# Patient Record
Sex: Male | Born: 1955
Health system: Southern US, Community
[De-identification: ages and names within clinical notes are randomized; demographics above are authoritative.]

## PROBLEM LIST (undated history)

## (undated) DIAGNOSIS — Z860100 Personal history of colon polyps, unspecified: Secondary | ICD-10-CM

## (undated) DIAGNOSIS — B9681 Helicobacter pylori [H. pylori] as the cause of diseases classified elsewhere: Secondary | ICD-10-CM

## (undated) DIAGNOSIS — K2970 Gastritis, unspecified, without bleeding: Secondary | ICD-10-CM

## (undated) DIAGNOSIS — E785 Hyperlipidemia, unspecified: Secondary | ICD-10-CM

## (undated) DIAGNOSIS — H269 Unspecified cataract: Secondary | ICD-10-CM

## (undated) DIAGNOSIS — F419 Anxiety disorder, unspecified: Secondary | ICD-10-CM

## (undated) DIAGNOSIS — R51 Headache: Secondary | ICD-10-CM

## (undated) DIAGNOSIS — G8929 Other chronic pain: Secondary | ICD-10-CM

## (undated) DIAGNOSIS — R519 Headache, unspecified: Secondary | ICD-10-CM

## (undated) DIAGNOSIS — I4891 Unspecified atrial fibrillation: Secondary | ICD-10-CM

## (undated) HISTORY — DX: Headache, unspecified: R51.9

## (undated) HISTORY — DX: Helicobacter pylori (H. pylori) as the cause of diseases classified elsewhere: B96.81

## (undated) HISTORY — DX: Headache: R51

## (undated) HISTORY — DX: Personal history of colon polyps, unspecified: Z86.0100

## (undated) HISTORY — DX: Hyperlipidemia, unspecified: E78.5

## (undated) HISTORY — DX: Unspecified cataract: H26.9

## (undated) HISTORY — DX: Anxiety disorder, unspecified: F41.9

## (undated) HISTORY — DX: Gastritis, unspecified, without bleeding: K29.70

## (undated) HISTORY — DX: Other chronic pain: G89.29

## (undated) HISTORY — PX: COLONOSCOPY: SHX174

## (undated) HISTORY — DX: Personal history of colonic polyps: Z86.010

---

## 2003-02-06 HISTORY — PX: HERNIA REPAIR: SHX51

## 2004-01-27 ENCOUNTER — Encounter: Admission: RE | Admit: 2004-01-27 | Discharge: 2004-01-27 | Payer: Self-pay | Admitting: Orthopaedic Surgery

## 2004-03-02 ENCOUNTER — Ambulatory Visit (HOSPITAL_BASED_OUTPATIENT_CLINIC_OR_DEPARTMENT_OTHER): Admission: RE | Admit: 2004-03-02 | Discharge: 2004-03-02 | Payer: Self-pay | Admitting: Orthopaedic Surgery

## 2005-02-05 HISTORY — PX: ROTATOR CUFF REPAIR: SHX139

## 2005-11-12 ENCOUNTER — Emergency Department (HOSPITAL_COMMUNITY): Admission: EM | Admit: 2005-11-12 | Discharge: 2005-11-12 | Payer: Self-pay | Admitting: Emergency Medicine

## 2005-11-18 ENCOUNTER — Encounter: Admission: RE | Admit: 2005-11-18 | Discharge: 2005-11-18 | Payer: Self-pay | Admitting: Neurosurgery

## 2006-01-03 ENCOUNTER — Encounter: Admission: RE | Admit: 2006-01-03 | Discharge: 2006-01-03 | Payer: Self-pay | Admitting: Neurosurgery

## 2006-01-16 ENCOUNTER — Ambulatory Visit (HOSPITAL_COMMUNITY): Admission: RE | Admit: 2006-01-16 | Discharge: 2006-01-16 | Payer: Self-pay | Admitting: Neurosurgery

## 2007-10-09 DIAGNOSIS — B9681 Helicobacter pylori [H. pylori] as the cause of diseases classified elsewhere: Secondary | ICD-10-CM

## 2007-10-09 HISTORY — DX: Helicobacter pylori (H. pylori) as the cause of diseases classified elsewhere: B96.81

## 2007-12-29 ENCOUNTER — Ambulatory Visit: Payer: Self-pay | Admitting: Unknown Physician Specialty

## 2008-02-02 ENCOUNTER — Ambulatory Visit: Payer: Self-pay | Admitting: Unknown Physician Specialty

## 2008-02-02 HISTORY — PX: UPPER GASTROINTESTINAL ENDOSCOPY: SHX188

## 2010-02-12 IMAGING — NM NUCLEAR MEDICINE HEPATOHBILIARY INCLUDE GB
1 series · 9 of 9 positions shown · non-contrast
Comparison: none

REASON FOR EXAM: abdominal pain    RUQ     epigastric
COMMENTS:

[Series 1000: gallbladder statics · 4.80mm/px · 9 of 9 slices shown]
[im 1/9]
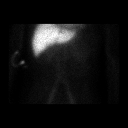
[im 2/9]
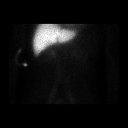
[im 3/9]
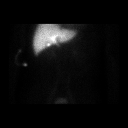
[im 4/9]
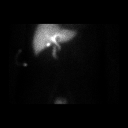
[im 5/9]
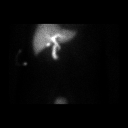
[im 6/9]
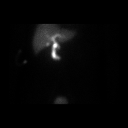
[im 7/9]
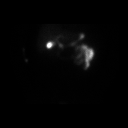
[im 8/9]
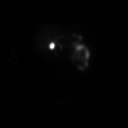
[im 9/9]
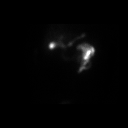

[9 of 9 positions shown; findings below may reference images not displayed]

PROCEDURE:     NM  - NM HEPATOBILIARY IMAGE  - December 29, 2007 [DATE]

RESULT:     Following intravenous administration of 7.5 mCi Tc 99m Choletec,
there is noted prompt visualization of tracer activity in the liver at 3
minutes. At 15 minutes tracer activity is seen in the gallbladder, common
duct and duodenum.  At 50 minutes tracer activity is present in the jejunum
and at 80 minutes there is dense tracer activity in the gallbladder with
little remaining tracer activity noted in the liver. Note is made that in
view of the nonspecific mobile echodensities in the gallbladder noted at
ultrasound, the gallbladder ejection determination was not performed.
IMPRESSION: 1. Normal hepatobiliary scan.
2. The gallbladder ejection fraction was not obtained in view of the mobile
echodensities in the gallbladder noted at ultrasound.

## 2010-10-28 ENCOUNTER — Encounter: Payer: Self-pay | Admitting: Neurosurgery

## 2011-04-10 ENCOUNTER — Encounter: Payer: Self-pay | Admitting: Internal Medicine

## 2011-05-28 ENCOUNTER — Encounter: Payer: Self-pay | Admitting: Internal Medicine

## 2011-05-28 ENCOUNTER — Other Ambulatory Visit (INDEPENDENT_AMBULATORY_CARE_PROVIDER_SITE_OTHER): Payer: Federal, State, Local not specified - PPO

## 2011-05-28 ENCOUNTER — Ambulatory Visit (INDEPENDENT_AMBULATORY_CARE_PROVIDER_SITE_OTHER): Payer: Federal, State, Local not specified - PPO | Admitting: Internal Medicine

## 2011-05-28 VITALS — BP 130/80 | HR 60 | Ht 67.0 in | Wt 179.2 lb

## 2011-05-28 DIAGNOSIS — K921 Melena: Secondary | ICD-10-CM

## 2011-05-28 DIAGNOSIS — R198 Other specified symptoms and signs involving the digestive system and abdomen: Secondary | ICD-10-CM | POA: Insufficient documentation

## 2011-05-28 DIAGNOSIS — R1011 Right upper quadrant pain: Secondary | ICD-10-CM

## 2011-05-28 DIAGNOSIS — K648 Other hemorrhoids: Secondary | ICD-10-CM | POA: Insufficient documentation

## 2011-05-28 LAB — COMPREHENSIVE METABOLIC PANEL
ALT: 31 U/L (ref 0–53)
AST: 19 U/L (ref 0–37)
Alkaline Phosphatase: 56 U/L (ref 39–117)
BUN: 20 mg/dL (ref 6–23)
Chloride: 106 mEq/L (ref 96–112)
Creatinine, Ser: 1.1 mg/dL (ref 0.4–1.5)
Total Bilirubin: 0.8 mg/dL (ref 0.3–1.2)

## 2011-05-28 LAB — CBC WITH DIFFERENTIAL/PLATELET
Basophils Absolute: 0 10*3/uL (ref 0.0–0.1)
Basophils Relative: 0.4 % (ref 0.0–3.0)
Eosinophils Absolute: 0.3 10*3/uL (ref 0.0–0.7)
Hemoglobin: 13.8 g/dL (ref 13.0–17.0)
Lymphocytes Relative: 31.4 % (ref 12.0–46.0)
Lymphs Abs: 1.9 10*3/uL (ref 0.7–4.0)
MCHC: 32.9 g/dL (ref 30.0–36.0)
MCV: 88.7 fl (ref 78.0–100.0)
Monocytes Absolute: 0.5 10*3/uL (ref 0.1–1.0)
Neutro Abs: 3.4 10*3/uL (ref 1.4–7.7)
RDW: 13.3 % (ref 11.5–14.6)

## 2011-05-28 MED ORDER — PEG-KCL-NACL-NASULF-NA ASC-C 100 G PO SOLR: 1.0000 | Freq: Once | ORAL | Status: DC

## 2011-05-28 MED ORDER — METOCLOPRAMIDE HCL 10 MG PO TABS: ORAL_TABLET | ORAL | Status: DC

## 2011-05-28 NOTE — Progress Notes (Signed)
  Subjective:    Patient ID: Alex Huffman, male    DOB: 07-Mar-1956, 55 y.o.   MRN: [id number]  HPI Problems began about 1 month ago. His wife and daughter are my patients, also. Two-two and 1/2 weeks of red blood seen in bowel, floating and in middle of water. Stools had reduced caliber to about size of 5th finger. He called here for appointment about 1 month ago. He then noticed things resolved with normal size of the stools and bleeding stopped.  Prior colonoscopy April 2009 Mec[facility] Collin) showed 2- 3mm cecal polyps (path not available) and internal hemorrhoids. He has seen blood from the hemorrhoids in past but this recent problem had clots and blobs, very different.   Previously recommended to have a cholecystectomy due to intermittent RUQ pain and gallstones. Still has RUQ pain related to fatty foods at times.  He had H. Pylori found and treated twice after he was working in [place]. 6-8# weight loss over the summer, is exercising more.   Review of Systems + fatigue, leg cramps, headaches, anal itching, urine leakage post-voiding - has to wait to make sure he empties completely.  All other ROS negative.    Objective:   Physical Exam General: Well-developed, well-nourished and in no acute distress Vitals: Reviewed and listed above Eyes:anicteric. Mouth and posterior pharynx: normal.  Neck: supple w/o thyromegaly or mass.  Lungs: clear. Heart: S1S2, no rubs, murmurs, gallops. Abdomen: soft, non-tender, no hepatosplenomegaly, hernia, or mass and BS+.  Rectal: deferred Lymphatics: no cervical or [place] nodes. Extremities:  no edema Skin no rash. Neuro: nonfocal. A&O x 3.  Psych: appropriate mood and  affect.         Assessment & Plan:

## 2011-05-28 NOTE — Assessment & Plan Note (Signed)
This sounds like small volume bleeding. I suspect it was probably anorectal but he says it's different than what he had in the past. Previous colonoscopy showed hemorrhoids and 2009, in the interval and the associated change in bowel habits plan on a repeat colonoscopy. Risks benefits and indications were explained he understands and agrees to proceed.

## 2011-05-28 NOTE — Assessment & Plan Note (Signed)
Intermittent right upper quadrant pain with a reported history of gallstones. This is probably secondary to symptomatic cholelithiasis. If so it does seem reasonable to have an elective cholecystectomy. I will review his records and discuss with him again.

## 2011-05-28 NOTE — Assessment & Plan Note (Signed)
This was transient as was the bleeding. Again suggesting hemorrhage but given his in each and the interval since last colonoscopy we'll plan for a repeat colonoscopy to investigate.

## 2011-05-28 NOTE — Patient Instructions (Addendum)
Please go to the basement upon leaving today to have your labs done. You have been scheduled for a Colonoscopy with separate instructions given. Patient requested Miralax prep. Your prep kit has been sent to your pharmacy for you to pick up.

## 2011-05-29 NOTE — Progress Notes (Signed)
Quick Note:  Labs are fine Awaiting [place] report and will discuss that at colonoscopy ______

## 2011-06-08 ENCOUNTER — Encounter: Payer: Self-pay | Admitting: Internal Medicine

## 2011-06-08 ENCOUNTER — Telehealth: Payer: Self-pay | Admitting: Gastroenterology

## 2011-06-08 ENCOUNTER — Ambulatory Visit (AMBULATORY_SURGERY_CENTER): Payer: Federal, State, Local not specified - PPO | Admitting: Internal Medicine

## 2011-06-08 VITALS — BP 140/88 | HR 56 | Temp 98.0°F | Resp 16 | Ht 69.0 in | Wt 179.0 lb

## 2011-06-08 DIAGNOSIS — D175 Benign lipomatous neoplasm of intra-abdominal organs: Secondary | ICD-10-CM

## 2011-06-08 DIAGNOSIS — K921 Melena: Secondary | ICD-10-CM

## 2011-06-08 DIAGNOSIS — K648 Other hemorrhoids: Secondary | ICD-10-CM

## 2011-06-08 DIAGNOSIS — D126 Benign neoplasm of colon, unspecified: Secondary | ICD-10-CM

## 2011-06-08 HISTORY — PX: COLONOSCOPY: SHX174

## 2011-06-08 MED ORDER — SODIUM CHLORIDE 0.9 % IV SOLN: 500.0000 mL | INTRAVENOUS | Status: DC

## 2011-06-08 NOTE — Telephone Encounter (Signed)
Message copied by Bernita Buffy on Fri Jun 08, 2011  9:34 AM ------      Message from: Iva Boop      Created: Fri Jun 08, 2011  9:06 AM      Regarding: are records here?       Have we seen the [place] and path reports from [facility]?            If in my folder sorry -            Just double check please

## 2011-06-08 NOTE — Telephone Encounter (Signed)
Called [facility] and faxed a records release.

## 2011-06-08 NOTE — Patient Instructions (Addendum)
I think the bleeding was from hemorrhoids. You had another small (4mm) polyp that I removed. It should be benign. Small lipoma (fatty tumor) also seen - not an issue.  Once I see your old records, especially the ultrasound, will contact you about a surgical referral. If you do not hear in 1-2 weeks, call us back.  Iva Boop, MD, [facility] Discharge instructions reviewed with patient and care partner.  Handouts given for polyp and hemorrhoids.  You may resume medications as you were taking them prior to your procedure.

## 2011-06-08 NOTE — Telephone Encounter (Signed)
Records received

## 2011-06-12 ENCOUNTER — Telehealth: Payer: Self-pay | Admitting: *Deleted

## 2011-06-12 NOTE — Telephone Encounter (Signed)
No i.d. On machine, no message left 

## 2011-06-15 ENCOUNTER — Encounter: Payer: Self-pay | Admitting: Internal Medicine

## 2011-06-15 DIAGNOSIS — Z860100 Personal history of colon polyps, unspecified: Secondary | ICD-10-CM | POA: Insufficient documentation

## 2011-06-15 NOTE — Progress Notes (Signed)
Quick Note:  Ok - ______ 

## 2012-10-15 DIAGNOSIS — R339 Retention of urine, unspecified: Secondary | ICD-10-CM | POA: Insufficient documentation

## 2012-10-15 DIAGNOSIS — N529 Male erectile dysfunction, unspecified: Secondary | ICD-10-CM | POA: Insufficient documentation

## 2012-10-15 DIAGNOSIS — N411 Chronic prostatitis: Secondary | ICD-10-CM | POA: Insufficient documentation

## 2012-10-15 DIAGNOSIS — R972 Elevated prostate specific antigen [PSA]: Secondary | ICD-10-CM | POA: Insufficient documentation

## 2012-10-15 DIAGNOSIS — N401 Enlarged prostate with lower urinary tract symptoms: Secondary | ICD-10-CM | POA: Insufficient documentation

## 2013-11-20 ENCOUNTER — Ambulatory Visit: Payer: Self-pay | Admitting: Family Medicine

## 2014-10-21 DIAGNOSIS — M5126 Other intervertebral disc displacement, lumbar region: Secondary | ICD-10-CM | POA: Insufficient documentation

## 2015-07-07 DIAGNOSIS — H33312 Horseshoe tear of retina without detachment, left eye: Secondary | ICD-10-CM | POA: Insufficient documentation

## 2016-05-09 DIAGNOSIS — R001 Bradycardia, unspecified: Secondary | ICD-10-CM | POA: Insufficient documentation

## 2016-06-18 ENCOUNTER — Encounter: Payer: Self-pay | Admitting: Internal Medicine

## 2016-07-31 DIAGNOSIS — Z23 Encounter for immunization: Secondary | ICD-10-CM | POA: Diagnosis not present

## 2016-08-22 DIAGNOSIS — J069 Acute upper respiratory infection, unspecified: Secondary | ICD-10-CM | POA: Diagnosis not present

## 2016-08-22 DIAGNOSIS — R05 Cough: Secondary | ICD-10-CM | POA: Diagnosis not present

## 2016-11-21 DIAGNOSIS — K1121 Acute sialoadenitis: Secondary | ICD-10-CM | POA: Diagnosis not present

## 2016-11-21 DIAGNOSIS — J340 Abscess, furuncle and carbuncle of nose: Secondary | ICD-10-CM | POA: Diagnosis not present

## 2016-12-06 DIAGNOSIS — H25813 Combined forms of age-related cataract, bilateral: Secondary | ICD-10-CM | POA: Insufficient documentation

## 2016-12-06 DIAGNOSIS — H4312 Vitreous hemorrhage, left eye: Secondary | ICD-10-CM | POA: Diagnosis not present

## 2016-12-06 DIAGNOSIS — H35411 Lattice degeneration of retina, right eye: Secondary | ICD-10-CM | POA: Diagnosis not present

## 2016-12-06 DIAGNOSIS — H33312 Horseshoe tear of retina without detachment, left eye: Secondary | ICD-10-CM | POA: Diagnosis not present

## 2016-12-18 DIAGNOSIS — H1033 Unspecified acute conjunctivitis, bilateral: Secondary | ICD-10-CM | POA: Diagnosis not present

## 2016-12-20 DIAGNOSIS — H1033 Unspecified acute conjunctivitis, bilateral: Secondary | ICD-10-CM | POA: Diagnosis not present

## 2017-04-08 DIAGNOSIS — K080 Exfoliation of teeth due to systemic causes: Secondary | ICD-10-CM | POA: Diagnosis not present

## 2017-08-08 DIAGNOSIS — H25813 Combined forms of age-related cataract, bilateral: Secondary | ICD-10-CM | POA: Diagnosis not present

## 2017-08-08 DIAGNOSIS — H35411 Lattice degeneration of retina, right eye: Secondary | ICD-10-CM | POA: Diagnosis not present

## 2017-08-08 DIAGNOSIS — H4312 Vitreous hemorrhage, left eye: Secondary | ICD-10-CM | POA: Diagnosis not present

## 2017-08-08 DIAGNOSIS — H33312 Horseshoe tear of retina without detachment, left eye: Secondary | ICD-10-CM | POA: Diagnosis not present

## 2017-09-10 DIAGNOSIS — H33312 Horseshoe tear of retina without detachment, left eye: Secondary | ICD-10-CM | POA: Diagnosis not present

## 2017-09-10 DIAGNOSIS — H25813 Combined forms of age-related cataract, bilateral: Secondary | ICD-10-CM | POA: Diagnosis not present

## 2017-09-10 DIAGNOSIS — H35411 Lattice degeneration of retina, right eye: Secondary | ICD-10-CM | POA: Diagnosis not present

## 2017-09-10 DIAGNOSIS — H4312 Vitreous hemorrhage, left eye: Secondary | ICD-10-CM | POA: Diagnosis not present

## 2017-11-05 ENCOUNTER — Encounter: Payer: Self-pay | Admitting: Internal Medicine

## 2017-12-17 ENCOUNTER — Encounter: Payer: Self-pay | Admitting: *Deleted

## 2017-12-27 DIAGNOSIS — M545 Low back pain: Secondary | ICD-10-CM | POA: Diagnosis not present

## 2018-01-01 DIAGNOSIS — M545 Low back pain: Secondary | ICD-10-CM | POA: Diagnosis not present

## 2018-01-03 ENCOUNTER — Ambulatory Visit (AMBULATORY_SURGERY_CENTER): Payer: Self-pay

## 2018-01-03 ENCOUNTER — Other Ambulatory Visit: Payer: Self-pay

## 2018-01-03 VITALS — Ht 67.0 in | Wt 194.4 lb

## 2018-01-03 DIAGNOSIS — Z86010 Personal history of colonic polyps: Secondary | ICD-10-CM

## 2018-01-03 DIAGNOSIS — M545 Low back pain: Secondary | ICD-10-CM | POA: Diagnosis not present

## 2018-01-03 NOTE — Progress Notes (Signed)
Denies allergies to eggs or soy products. Denies complication of anesthesia or sedation. Denies use of weight loss medication. Denies use of O2.   Emmi instructions declined.  

## 2018-01-06 DIAGNOSIS — M545 Low back pain: Secondary | ICD-10-CM | POA: Diagnosis not present

## 2018-01-09 DIAGNOSIS — M545 Low back pain: Secondary | ICD-10-CM | POA: Diagnosis not present

## 2018-01-15 ENCOUNTER — Telehealth: Payer: Self-pay | Admitting: Internal Medicine

## 2018-01-15 NOTE — Telephone Encounter (Signed)
Left message for pt we had returned his call. Gwyndolyn Saxon

## 2018-01-15 NOTE — Telephone Encounter (Signed)
Pt has questions regarding prep. Would like a call back. Thank you.

## 2018-01-15 NOTE — Telephone Encounter (Signed)
Pt returned phone call and was asking if he could drink the prep 2 days before the procedure. Advised pt we recommend drinking the prep as directed, but he could start it a little earlier on Friday morning, since he is driving from [place].  Pt states he is going to start a liquid diet on his own, a few days ahead and drink the Miralax a little earlier than directed. He will call back if he has further questions. Gwyndolyn Saxon

## 2018-01-17 ENCOUNTER — Encounter: Payer: Self-pay | Admitting: Internal Medicine

## 2018-01-17 ENCOUNTER — Ambulatory Visit (AMBULATORY_SURGERY_CENTER): Payer: Federal, State, Local not specified - PPO | Admitting: Internal Medicine

## 2018-01-17 ENCOUNTER — Other Ambulatory Visit: Payer: Self-pay

## 2018-01-17 VITALS — BP 133/85 | HR 52 | Temp 98.6°F | Resp 12 | Ht 67.0 in | Wt 194.0 lb

## 2018-01-17 DIAGNOSIS — Z1211 Encounter for screening for malignant neoplasm of colon: Secondary | ICD-10-CM | POA: Diagnosis not present

## 2018-01-17 DIAGNOSIS — Z86010 Personal history of colonic polyps: Secondary | ICD-10-CM

## 2018-01-17 MED ORDER — SODIUM CHLORIDE 0.9 % IV SOLN: 500.0000 mL | Freq: Once | INTRAVENOUS | Status: DC

## 2018-01-17 NOTE — Patient Instructions (Addendum)
   No polyps today.  You do have diverticulosis - thickened muscle rings and pouches in the colon wall. Please read the handout about this condition.  Next routine colonoscopy or other screening test in 10 years - 2029  I appreciate the opportunity to care for you. Gatha Mayer, MD, FACG  YOU HAD AN ENDOSCOPIC PROCEDURE TODAY AT [facility]:   Refer to the procedure report that was given to you for any specific questions about what was found during the examination.  If the procedure report does not answer your questions, please call your gastroenterologist to clarify.  If you requested that your care partner not be given the details of your procedure findings, then the procedure report has been included in a sealed envelope for you to review at your convenience later.  YOU SHOULD EXPECT: Some feelings of bloating in the abdomen. Passage of more gas than usual.  Walking can help get rid of the air that was put into your GI tract during the procedure and reduce the bloating. If you had a lower endoscopy (such as a colonoscopy or flexible sigmoidoscopy) you may notice spotting of blood in your stool or on the toilet paper. If you underwent a bowel prep for your procedure, you may not have a normal bowel movement for a few days.  Please Note:  You might notice some irritation and congestion in your nose or some drainage.  This is from the oxygen used during your procedure.  There is no need for concern and it should clear up in a day or so.  SYMPTOMS TO REPORT IMMEDIATELY:   Following lower endoscopy (colonoscopy or flexible sigmoidoscopy):  Excessive amounts of blood in the stool  Significant tenderness or worsening of abdominal pains  Swelling of the abdomen that is new, acute  Fever of 100F or higher  For urgent or emergent issues, a gastroenterologist can be reached at any hour by calling [phone].   DIET:  We do recommend a small meal at first, but then  you may proceed to your regular diet.  Drink plenty of fluids but you should avoid alcoholic beverages for 24 hours.  ACTIVITY:  You should plan to take it easy for the rest of today and you should NOT DRIVE or use heavy machinery until tomorrow (because of the sedation medicines used during the test).    FOLLOW UP: Our staff will call the number listed on your records the next business day following your procedure to check on you and address any questions or concerns that you may have regarding the information given to you following your procedure. If we do not reach you, we will leave a message.  However, if you are feeling well and you are not experiencing any problems, there is no need to return our call.  We will assume that you have returned to your regular daily activities without incident.  If any biopsies were taken you will be contacted by phone or by letter within the next 1-3 weeks.  Please call us at [phone] if you have not heard about the biopsies in 3 weeks.    SIGNATURES/CONFIDENTIALITY: You and/or your care partner have signed paperwork which will be entered into your electronic medical record.  These signatures attest to the fact that that the information above on your After Visit Summary has been reviewed and is understood.  Full responsibility of the confidentiality of this discharge information lies with you and/or your care-partner.

## 2018-01-17 NOTE — Progress Notes (Signed)
Pt's states no medical or surgical changes since previsit or office visit. 

## 2018-01-17 NOTE — Op Note (Signed)
[name] Procedure Date: 01/17/2018 1:33 PM MRN: [id number] Endoscopist: Gatha Mayer , MD Age: 62 Referring MD:  Date of Birth: [birth year] Gender: Male Account #: [id number] Procedure:                Colonoscopy Indications:              High risk colon cancer surveillance: Personal                            history of colonic polyps, Last colonoscopy: 2012 Medicines:                Propofol per Anesthesia, Monitored Anesthesia Care Procedure:                Pre-Anesthesia Assessment:                           - Prior to the procedure, a History and Physical                            was performed, and patient medications and                            allergies were reviewed. The patient's tolerance of                            previous anesthesia was also reviewed. The risks                            and benefits of the procedure and the sedation                            options and risks were discussed with the patient.                            All questions were answered, and informed consent                            was obtained. Prior Anticoagulants: The patient has                            taken no previous anticoagulant or antiplatelet                            agents. ASA Grade Assessment: II - A patient with                            mild systemic disease. After reviewing the risks                            and benefits, the patient was deemed in                            satisfactory condition to undergo the procedure.  After obtaining informed consent, the colonoscope                            was passed under direct vision. Throughout the                            procedure, the patient's blood pressure, pulse, and                            oxygen saturations were monitored continuously. The                            Colonoscope was introduced through the anus and         advanced to the the cecum, identified by                            appendiceal orifice and ileocecal valve. The                            colonoscopy was performed without difficulty. The                            patient tolerated the procedure well. The quality                            of the bowel preparation was good. The bowel                            preparation used was Miralax. The ileocecal valve,                            appendiceal orifice, and rectum were photographed. Scope In: 1:40:53 PM Scope Out: 1:58:06 PM Scope Withdrawal Time: 0 hours 12 minutes 40 seconds  Total Procedure Duration: 0 hours 17 minutes 13 seconds  Findings:                 The perianal and digital rectal examinations were                            normal. Pertinent negatives include normal prostate                            (size, shape, and consistency).                           A few small-mouthed diverticula were found in the                            sigmoid colon.                           The exam was otherwise without abnormality on                            direct and retroflexion  views. Complications:            No immediate complications. Estimated Blood Loss:     Estimated blood loss: none. Impression:               - Diverticulosis in the sigmoid colon.                           - The examination was otherwise normal on direct                            and retroflexion views.                           - No specimens collected. Recommendation:           - Patient has a contact number available for                            emergencies. The signs and symptoms of potential                            delayed complications were discussed with the                            patient. Return to normal activities tomorrow.                            Written discharge instructions were provided to the                            patient.                           - Resume  previous diet.                           - Continue present medications.                           - Repeat colonoscopy in 10 years. Gatha Mayer, MD 01/17/2018 2:05:40 PM This report has been signed electronically.

## 2018-01-17 NOTE — Progress Notes (Signed)
A and O x3. Report to RN. Tolerated MAC anesthesia well.

## 2018-01-17 NOTE — Progress Notes (Signed)
Pt. Slow to wake up.   Alert at time of discharge.

## 2018-01-20 ENCOUNTER — Telehealth: Payer: Self-pay | Admitting: *Deleted

## 2018-01-20 NOTE — Telephone Encounter (Signed)
  Follow up Call-  Call back number 01/17/2018  Post procedure Call Back phone  # [phone]  Permission to leave phone message Yes  Some recent data might be hidden     Patient questions:  Do you have a fever, pain , or abdominal swelling? No. Pain Score  0 *  Have you tolerated food without any problems? Yes   Have you been able to return to your normal activities? Yes   Do you have any questions about your discharge instructions: Diet   No  Medications  No. Follow up visit  No.  Do you have questions or concerns about your Care? No.  Actions: * If pain score is 4 or above: No action needed, pain <4.

## 2018-01-20 NOTE — Telephone Encounter (Signed)
No answer for post procedure call back. Will attempt to call back later this afternoon. SM 

## 2018-03-18 DIAGNOSIS — K080 Exfoliation of teeth due to systemic causes: Secondary | ICD-10-CM | POA: Diagnosis not present

## 2018-04-07 DIAGNOSIS — R5383 Other fatigue: Secondary | ICD-10-CM | POA: Diagnosis not present

## 2018-04-07 DIAGNOSIS — S30861A Insect bite (nonvenomous) of abdominal wall, initial encounter: Secondary | ICD-10-CM | POA: Diagnosis not present

## 2018-04-07 DIAGNOSIS — R21 Rash and other nonspecific skin eruption: Secondary | ICD-10-CM | POA: Diagnosis not present

## 2018-04-07 DIAGNOSIS — R030 Elevated blood-pressure reading, without diagnosis of hypertension: Secondary | ICD-10-CM | POA: Diagnosis not present

## 2018-05-02 ENCOUNTER — Ambulatory Visit: Payer: Federal, State, Local not specified - PPO | Admitting: Family Medicine

## 2018-05-02 ENCOUNTER — Encounter: Payer: Self-pay | Admitting: Family Medicine

## 2018-05-02 VITALS — BP 172/100 | HR 58 | Temp 98.5°F | Ht 66.5 in | Wt 190.8 lb

## 2018-05-02 DIAGNOSIS — Z87898 Personal history of other specified conditions: Secondary | ICD-10-CM | POA: Diagnosis not present

## 2018-05-02 DIAGNOSIS — I10 Essential (primary) hypertension: Secondary | ICD-10-CM

## 2018-05-02 DIAGNOSIS — H9202 Otalgia, left ear: Secondary | ICD-10-CM

## 2018-05-02 DIAGNOSIS — Z7689 Persons encountering health services in other specified circumstances: Secondary | ICD-10-CM

## 2018-05-02 DIAGNOSIS — E669 Obesity, unspecified: Secondary | ICD-10-CM

## 2018-05-02 LAB — CBC WITH DIFFERENTIAL/PLATELET
BASOS PCT: 0.8 % (ref 0.0–3.0)
Basophils Absolute: 0.1 10*3/uL (ref 0.0–0.1)
Eosinophils Absolute: 0.5 10*3/uL (ref 0.0–0.7)
Eosinophils Relative: 7.9 % — ABNORMAL HIGH (ref 0.0–5.0)
HEMATOCRIT: 43.3 % (ref 39.0–52.0)
Hemoglobin: 14.5 g/dL (ref 13.0–17.0)
LYMPHS ABS: 1.6 10*3/uL (ref 0.7–4.0)
Lymphocytes Relative: 25.9 % (ref 12.0–46.0)
MCHC: 33.5 g/dL (ref 30.0–36.0)
MCV: 88 fl (ref 78.0–100.0)
MONOS PCT: 7.3 % (ref 3.0–12.0)
Monocytes Absolute: 0.5 10*3/uL (ref 0.1–1.0)
NEUTROS ABS: 3.6 10*3/uL (ref 1.4–7.7)
Neutrophils Relative %: 58.1 % (ref 43.0–77.0)
PLATELETS: 229 10*3/uL (ref 150.0–400.0)
RBC: 4.92 Mil/uL (ref 4.22–5.81)
RDW: 13.3 % (ref 11.5–15.5)
WBC: 6.2 10*3/uL (ref 4.0–10.5)

## 2018-05-02 LAB — COMPREHENSIVE METABOLIC PANEL
ALT: 57 U/L — ABNORMAL HIGH (ref 0–53)
AST: 23 U/L (ref 0–37)
Albumin: 4.5 g/dL (ref 3.5–5.2)
Alkaline Phosphatase: 58 U/L (ref 39–117)
BILIRUBIN TOTAL: 0.6 mg/dL (ref 0.2–1.2)
BUN: 27 mg/dL — ABNORMAL HIGH (ref 6–23)
CALCIUM: 9.3 mg/dL (ref 8.4–10.5)
CHLORIDE: 103 meq/L (ref 96–112)
CO2: 27 meq/L (ref 19–32)
Creatinine, Ser: 1.2 mg/dL (ref 0.40–1.50)
GFR: 64.74 mL/min (ref >60.00)
Glucose, Bld: 102 mg/dL — ABNORMAL HIGH (ref 70–99)
POTASSIUM: 4.4 meq/L (ref 3.5–5.1)
Sodium: 140 mEq/L (ref 135–145)
Total Protein: 7.1 g/dL (ref 6.0–8.3)

## 2018-05-02 LAB — LIPID PANEL
CHOL/HDL RATIO: 6
CHOLESTEROL: 268 mg/dL — AB (ref 0–200)
HDL: 42.4 mg/dL (ref >39.00)
NONHDL: 225.45
TRIGLYCERIDES: 356 mg/dL — AB (ref 0.0–149.0)
VLDL: 71.2 mg/dL — AB (ref 0.0–40.0)

## 2018-05-02 LAB — LDL CHOLESTEROL, DIRECT: Direct LDL: 72 mg/dL

## 2018-05-02 LAB — PSA: PSA: 5.18 ng/mL — ABNORMAL HIGH (ref 0.10–4.00)

## 2018-05-02 LAB — TSH: TSH: 4.55 u[IU]/mL — AB (ref 0.35–4.50)

## 2018-05-02 MED ORDER — LOSARTAN POTASSIUM 50 MG PO TABS: 50.0000 mg | ORAL_TABLET | Freq: Every day | ORAL | 3 refills | Status: DC

## 2018-05-02 NOTE — Progress Notes (Signed)
Subjective:    Patient ID: Alex Huffman, male    DOB: [birth year], 62 y.o.   MRN: [id number]  HPI This is a 62 yo male who presents today to establish care. He is married, retired. Has three daughters, granddaughter Anda Kraft (46 months old). Feels like he needs some "oversight with his health."   Last CPE- several years ago, has had intermittent acute care PSA- has history of elevation to 10 following a DRE and then went down, advised to have biopsy but declined.  Colonoscopy- 01/17/18 Carle[facility] Purl) Tdap- unsure, will check records Flu- annual Dental- regular Eye- [facility]- at least annually for detached retina Exercise- used to run for many years, stopped after he got detached retina while running  HTN- has had elevated readings off and on, previously on amlodipine which caused leg pain and fatigue so he stopped. Strong family history of HTN. Eats no fast food, little processed food.   Hyperlipidemia- was able to decrease with diet in the past  Since retiring, doesn't feel like he has a purpose, misses being the go to guy, getting called, being relied upon. Spends his days doing things around the house. Gets a lot of joy from seeing granddaughter. Stressed with one of his daughters moving to [place].   Past Medical History:  Diagnosis Date  . Cataract   . Chronic headaches   . Helicobacter pylori gastritis 2009   EGD  . Hyperlipemia   . Personal history of colonic polyps    Past Surgical History:  Procedure Laterality Date  . COLONOSCOPY  , 06/08/2011   2009: two 3 mm hyperplastic polyps, lipoma, internal hemorrjhoids 2012: 48mm cecal serrated adenoma, lipoma, internal hemorrhoids  . HERNIA REPAIR  05/04  . ROTATOR CUFF REPAIR  05/06  . UPPER GASTROINTESTINAL ENDOSCOPY  02/02/2008   erosive gastropathy   Family History  Problem Relation Age of Onset  . Heart disease Father   . Celiac disease Mother   . Colon cancer Neg Hx   . Esophageal cancer Neg Hx   . Liver cancer Neg Hx   .  Pancreatic cancer Neg Hx   . Rectal cancer Neg Hx   . Stomach cancer Neg Hx    Social History   Tobacco Use  . Smoking status: Never Smoker  . Smokeless tobacco: Never Used  Substance Use Topics  . Alcohol use: Yes    Comment: Occasional beer  . Drug use: No      Review of Systems  Constitutional: Positive for fatigue (mild, long standing).  HENT: Positive for ear pain (left, uses qtips, feels "spongy.").   Eyes: Negative.   Respiratory: Negative for cough, chest tightness and shortness of breath.   Cardiovascular: Negative.   Gastrointestinal: Negative.   Genitourinary: Negative for dysuria and frequency.       Intermittent weak stream for several years.   Musculoskeletal: Negative.   Psychiatric/Behavioral: Negative.        Objective:   Physical Exam  Constitutional: He is oriented to person, place, and time. He appears well-developed and well-nourished. No distress.  HENT:  Head: Normocephalic and atraumatic.  Right Ear: Tympanic membrane, external ear and ear canal normal.  Left Ear: External ear normal.  Nose: Nose normal.  Mouth/Throat: Oropharynx is clear and moist.  Right ear with moderate amount cerumen obstruction TM. Irrigated by [employer] Patient with pain, irrigation stopped. Cerumen with some movement, able to visualize portion of TM, no redness, no tenderness to exam. Debrox instilled into canal to help with  cerumen removal.   Eyes: Conjunctivae are normal.  Cardiovascular: Normal rate, regular rhythm and normal heart sounds.  Pulmonary/Chest: Effort normal and breath sounds normal.  Musculoskeletal: He exhibits no edema.  Neurological: He is alert and oriented to person, place, and time.  Skin: Skin is warm and dry. He is not diaphoretic.  Psychiatric: He has a normal mood and affect. His behavior is normal. Judgment and thought content normal.  Vitals reviewed.     BP (!) 172/100 (BP Location: Right Arm, Patient Position: Sitting, Cuff Size: Normal)    Pulse (!) 58   Temp 98.5 F (36.9 C) (Oral)   Ht 5' 6.5" (1.689 m)   Wt 190 lb 12 oz (86.5 kg)   SpO2 97%   BMI 30.33 kg/m  Wt Readings from Last 3 Encounters:  05/02/18 190 lb 12 oz (86.5 kg)  01/17/18 194 lb (88 kg)  01/03/18 194 lb 6.4 oz (88.2 kg)   Recheck BP- 164/104 Depression screen PHQ 2/9 05/02/2018  Decreased Interest 0  Down, Depressed, Hopeless 0  PHQ - 2 Score 0       Assessment & Plan:  1. Encounter to establish care - Discussed and encouraged healthy lifestyle choices- adequate sleep, regular exercise, stress management and healthy food choices.  - follow up for CPE in 4-6 weeks  2. Essential hypertension - discussed need for medication with patient, importance of avoiding excessive salt in diet, weight loss, exercise, role of genes - CBC with Differential - Comprehensive metabolic panel - Lipid Panel - losartan (COZAAR) 50 MG tablet; Take 1 tablet (50 mg total) by mouth daily.  Dispense: 90 tablet; Refill: 3 - TSH  3. History of elevated PSA - PSA  4. Left ear pain - advised OTC wax softening agent, advised against cotton swab use as I think pain from impacted cerumen  5. Obesity (BMI 30.0-34.9) - encouraged increased exercise, continued healthy food choices   Clarene Reamer, FNP-BC  [facility], [facility]  05/03/2018 8:49 AM

## 2018-05-02 NOTE — Patient Instructions (Addendum)
Good to meet you today  Please follow up in 4-6 weeks for a complete physical exam  Get signed up for your Mychart account and I will send you an email notification about your labs  Let me know if you have any problems with your medication

## 2018-05-05 ENCOUNTER — Other Ambulatory Visit: Payer: Self-pay | Admitting: Family Medicine

## 2018-05-05 ENCOUNTER — Encounter: Payer: Self-pay | Admitting: Family Medicine

## 2018-05-05 DIAGNOSIS — E039 Hypothyroidism, unspecified: Secondary | ICD-10-CM

## 2018-05-05 MED ORDER — LEVOTHYROXINE SODIUM 50 MCG PO TABS: 50.0000 ug | ORAL_TABLET | Freq: Every day | ORAL | 1 refills | Status: DC

## 2018-05-09 ENCOUNTER — Encounter: Payer: Self-pay | Admitting: Family Medicine

## 2018-05-10 ENCOUNTER — Encounter: Payer: Self-pay | Admitting: Family Medicine

## 2018-06-02 ENCOUNTER — Telehealth: Payer: Self-pay | Admitting: Internal Medicine

## 2018-06-02 ENCOUNTER — Encounter: Payer: Self-pay | Admitting: Family Medicine

## 2018-06-02 ENCOUNTER — Ambulatory Visit (INDEPENDENT_AMBULATORY_CARE_PROVIDER_SITE_OTHER): Payer: Federal, State, Local not specified - PPO | Admitting: Family Medicine

## 2018-06-02 VITALS — BP 116/72 | HR 56 | Temp 97.6°F | Ht 66.5 in | Wt 176.8 lb

## 2018-06-02 DIAGNOSIS — I10 Essential (primary) hypertension: Secondary | ICD-10-CM

## 2018-06-02 DIAGNOSIS — Z87898 Personal history of other specified conditions: Secondary | ICD-10-CM

## 2018-06-02 DIAGNOSIS — E039 Hypothyroidism, unspecified: Secondary | ICD-10-CM

## 2018-06-02 DIAGNOSIS — Z0000 Encounter for general adult medical examination without abnormal findings: Secondary | ICD-10-CM

## 2018-06-02 DIAGNOSIS — E7800 Pure hypercholesterolemia, unspecified: Secondary | ICD-10-CM | POA: Diagnosis not present

## 2018-06-02 LAB — LIPID PANEL
CHOL/HDL RATIO: 3
CHOLESTEROL: 130 mg/dL (ref 0–200)
HDL: 49.1 mg/dL (ref >39.00)
LDL Cholesterol: 62 mg/dL (ref 0–99)
NonHDL: 80.63
TRIGLYCERIDES: 92 mg/dL (ref 0.0–149.0)
VLDL: 18.4 mg/dL (ref 0.0–40.0)

## 2018-06-02 LAB — TSH: TSH: 0.99 u[IU]/mL (ref 0.35–4.50)

## 2018-06-02 NOTE — Patient Instructions (Signed)
Good to see you today  Please follow up in 6 months  I'll be in touch about your labs  Keeping you healthy  Get these tests  Blood pressure- Have your blood pressure checked once a year by your healthcare provider.  Normal blood pressure is 120/80  Weight- Have your body mass index (BMI) calculated to screen for obesity.  BMI is a measure of body fat based on height and weight. You can also calculate your own BMI at [employer]Bank[employer].si.  Cholesterol- Have your cholesterol checked every year.  Diabetes- Have your blood sugar checked regularly if you have high blood pressure, high cholesterol, have a family history of diabetes or if you are overweight.  Screening for Colon Cancer- Colonoscopy starting at age 61.  Screening may begin sooner depending on your family history and other health conditions. Follow up colonoscopy as directed by your Gastroenterologist.  Screening for Prostate Cancer- Both blood work (PSA) and a rectal exam help screen for Prostate Cancer.  Screening begins at age 21 with African-American men and at age 101 with Caucasian men.  Screening may begin sooner depending on your family history.  Take these medicines  Aspirin- One aspirin daily can help prevent Heart disease and Stroke.  Flu shot- Every fall.  Tetanus- Every 10 years.  Zostavax- Once after the age of 85 to prevent Shingles.  Pneumonia shot- Once after the age of 77; if you are younger than 32, ask your healthcare provider if you need a Pneumonia shot.  Take these steps  Don't smoke- If you do smoke, talk to your doctor about quitting.  For tips on how to quit, go to www.smokefree.gov or call 1-800-QUIT-NOW.  Be physically active- Exercise 5 days a week for at least 30 minutes.  If you are not already physically active start slow and gradually work up to 30 minutes of moderate physical activity.  Examples of moderate activity include walking briskly, mowing the yard, dancing, swimming,  bicycling, etc.  Eat a healthy diet- Eat a variety of healthy food such as fruits, vegetables, low fat milk, low fat cheese, yogurt, lean meant, poultry, fish, beans, tofu, etc. For more information go to www.thenutritionsource.org  Drink alcohol in moderation- Limit alcohol intake to less than two drinks a day. Never drink and drive.  Dentist- Brush and floss twice daily; visit your dentist twice a year.  Depression- Your emotional health is as important as your physical health. If you're feeling down, or losing interest in things you would normally enjoy please talk to your healthcare provider.  Eye exam- Visit your eye doctor every year.  Safe sex- If you may be exposed to a sexually transmitted infection, use a condom.  Seat belts- Seat belts can save your life; always wear one.  Smoke/Carbon Monoxide detectors- These detectors need to be installed on the appropriate level of your home.  Replace batteries at least once a year.  Skin cancer- When out in the sun, cover up and use sunscreen 15 SPF or higher.  Violence- If anyone is threatening you, please tell your healthcare provider.  Living Will/ Health care power of attorney- Speak with your healthcare provider and family.

## 2018-06-02 NOTE — Telephone Encounter (Signed)
Patient asking for a urologists recommendation

## 2018-06-02 NOTE — Progress Notes (Signed)
Subjective:    Patient ID: Alex Huffman, male    DOB: 1956-04-19, 62 y.o.   MRN: [id number]  HPI This is a 62 yo male who presents today for CPE. Was seen last month and started on losartan 50 mg and synthroid 50 mcg. Did not start losartan.    Last CPE- several years ago, has had intermittent acute care PSA- has history of elevation to 10 following a DRE and then went down, advised to have biopsy but declined.  Colonoscopy- 01/17/18 Carlean Purl) Tdap- unsure, will check records Flu- annual Dental- regular Eye- [facility]- at least annually for detached retina Exercise- has been walking daily Diet- has been watching diet  HTN- brings daily BP log, bps started 140-150s/90-100 but have come down in last 3 weeks to 120-130s/70-80s.   Past Medical History:  Diagnosis Date  . Cataract   . Chronic headaches   . Helicobacter pylori gastritis 2009   EGD  . Hyperlipemia   . Personal history of colonic polyps    Past Surgical History:  Procedure Laterality Date  . COLONOSCOPY  , 06/08/2011   2009: two 3 mm hyperplastic polyps, lipoma, internal hemorrjhoids 2012: 52mm cecal serrated adenoma, lipoma, internal hemorrhoids  . HERNIA REPAIR  05/04  . ROTATOR CUFF REPAIR  05/06  . UPPER GASTROINTESTINAL ENDOSCOPY  02/02/2008   erosive gastropathy   Family History  Problem Relation Age of Onset  . Heart disease Father   . Celiac disease Mother   . Colon cancer Neg Hx   . Esophageal cancer Neg Hx   . Liver cancer Neg Hx   . Pancreatic cancer Neg Hx   . Rectal cancer Neg Hx   . Stomach cancer Neg Hx    Social History   Tobacco Use  . Smoking status: Never Smoker  . Smokeless tobacco: Never Used  Substance Use Topics  . Alcohol use: Yes    Comment: Occasional beer  . Drug use: No    Review of Systems  Constitutional: Negative.   Eyes: Positive for visual disturbance (with running, not with walking, has follow up with retina specialist upcoming).  Respiratory: Negative.     Cardiovascular: Negative.   Gastrointestinal: Negative.   Endocrine: Negative.   Genitourinary: Negative.   Musculoskeletal: Negative.   Allergic/Immunologic: Negative.   Neurological: Negative.   Hematological: Negative.   Psychiatric/Behavioral: Negative.        Mood and sleep improved. Not watching as much news.        Objective:   Physical Exam Physical Exam  Constitutional: He is oriented to person, place, and time. He appears well-developed and well-nourished.  HENT:  Head: Normocephalic and atraumatic.  Right Ear: External ear normal.  Left Ear: External ear normal.  Nose: Nose normal.  Mouth/Throat: Oropharynx is clear and moist.  Eyes: Conjunctivae are normal. Pupils are equal, round, and reactive to light.  Neck: Normal range of motion. Neck supple.  Cardiovascular: Normal rate, regular rhythm, normal heart sounds and intact distal pulses.   Pulmonary/Chest: Effort normal and breath sounds normal.  Abdominal: Soft. Bowel sounds are normal.  Musculoskeletal: Normal range of motion. He exhibits no edema or tenderness.       Cervical back: Normal.       Thoracic back: Normal.       Lumbar back: Normal.  Lymphadenopathy:    He has no cervical adenopathy.       Right: No inguinal adenopathy present.       Left: No inguinal adenopathy  present.  Neurological: He is alert and oriented to person, place, and time.   Skin: Skin is warm and dry.  Psychiatric: He has a normal mood and affect. His behavior is normal. Judgment normal.  Vitals reviewed.     BP 116/72 (BP Location: Left Arm, Patient Position: Sitting, Cuff Size: Normal)   Pulse (!) 56   Temp 97.6 F (36.4 C) (Oral)   Ht 5' 6.5" (1.689 m)   Wt 176 lb 12 oz (80.2 kg)   SpO2 97%   BMI 28.10 kg/m  Wt Readings from Last 3 Encounters:  06/02/18 176 lb 12 oz (80.2 kg)  05/02/18 190 lb 12 oz (86.5 kg)  01/17/18 194 lb (88 kg)   BP Readings from Last 3 Encounters:  06/02/18 116/72  05/02/18 (!) 172/100   01/17/18 133/85       Assessment & Plan:  1. Annual physical exam - Discussed and encouraged healthy lifestyle choices- adequate sleep, regular exercise, stress management and healthy food choices.   2. Acquired hypothyroidism - TSH  3. Elevated cholesterol - Lipid Panel  4. Essential hypertension - well controlled in office today and according to home readings - no meds currently, continue weight loss, exercise, healthy food choices  5. History of elevated PSA - Ambulatory referral to Urology  - follow up 6 months  Clarene Reamer, FNP-BC  [facility], [place]  06/02/2018 1:04 PM

## 2018-06-03 ENCOUNTER — Encounter: Payer: Self-pay | Admitting: Family Medicine

## 2018-06-03 NOTE — Telephone Encounter (Signed)
He is seeing Jackelyn Poling and she is referring him for an elevated PSA  ? If this was misdirected  I can give him some names but Jackelyn Poling knows the same ones I think

## 2018-06-03 NOTE — Telephone Encounter (Signed)
Dr Carlean Purl patient asked to see if you had certain urologist you recommend also. I spoke with Clarene Reamer, NP and she did not have any specific preference. We use [facility] or [facility]. I did speak with patient at length in regards to both of those offices. He did ask for Dr Bernardo Heater but wanted to alsp see if you had a preference in someone else also? Thank you-Marc Leichter V Nayali Talerico, RMA (from [facility])

## 2018-06-03 NOTE — Telephone Encounter (Signed)
OK  Well I would tell him I do not have 1 or 2 go to people - I think [facility] is really good across the board. I do not know Dr. Bernardo Heater but bet he is fine.  It could make a difference if the elevated PSA represents a problem and he needs surgery, etc (hope not!) - that is where you would want to be more choosy  Hope this helps  Take Care  CEG

## 2018-06-04 NOTE — Telephone Encounter (Signed)
Noted. Left message for patient to me back-Alex Huffman Estell Harpin, RMA

## 2018-06-06 NOTE — Telephone Encounter (Signed)
Left message for patient to call back-Alex Huffman [name], RMA  

## 2018-06-10 NOTE — Telephone Encounter (Signed)
Sent mychart message to patient with all the information-Alex Huffman, RMA

## 2018-06-11 ENCOUNTER — Ambulatory Visit (INDEPENDENT_AMBULATORY_CARE_PROVIDER_SITE_OTHER): Payer: Federal, State, Local not specified - PPO | Admitting: Family Medicine

## 2018-06-11 ENCOUNTER — Ambulatory Visit: Payer: Self-pay | Admitting: Family Medicine

## 2018-06-11 ENCOUNTER — Encounter: Payer: Self-pay | Admitting: Family Medicine

## 2018-06-11 ENCOUNTER — Ambulatory Visit: Payer: Self-pay | Admitting: *Deleted

## 2018-06-11 VITALS — BP 96/60 | HR 90 | Temp 98.4°F | Ht 66.5 in | Wt 176.8 lb

## 2018-06-11 DIAGNOSIS — R42 Dizziness and giddiness: Secondary | ICD-10-CM | POA: Diagnosis not present

## 2018-06-11 DIAGNOSIS — R002 Palpitations: Secondary | ICD-10-CM

## 2018-06-11 DIAGNOSIS — E039 Hypothyroidism, unspecified: Secondary | ICD-10-CM

## 2018-06-11 DIAGNOSIS — R634 Abnormal weight loss: Secondary | ICD-10-CM | POA: Diagnosis not present

## 2018-06-11 NOTE — Telephone Encounter (Signed)
  He called in saying he started taking Synthroid towards the end of July.  Over the last several days he has begun having symptoms listed below.   He is wondering if it is from the synthroid.  I have routed a high priority note to Clarene Reamer, FNP.   I let him know someone from the office will be in contact with him.  He was agreeable to this plan.   Reason for Disposition . Caller has URGENT medication question about med that PCP prescribed and triager unable to answer question  Answer Assessment - Initial Assessment Questions 1. SYMPTOMS: "Do you have any symptoms?"     I'm on Synthroid.  Just started it later part of July.   I was fine.   Over last several days I've noticed a change.   Dizziness, rapid heart beat, clammy, heat intolerance, headache and less urine output.   I'm out of breath where I wasn't before.   My BP was 97/78.  Pulse 80.   Usually my pulse is 45-50.   I feel a little nervousness.   I'm doing good with the diet and exercise program.   I'm losing weight. Also when I swallow I can feel myself swallow.   But I feel it and notice it when I swallow.    2. SEVERITY: If symptoms are present, ask "Are they mild, moderate or severe?"     *No Answer*  Protocols used: MEDICATION QUESTION CALL-A-AH

## 2018-06-11 NOTE — Telephone Encounter (Signed)
Also see mychart message

## 2018-06-11 NOTE — Patient Instructions (Signed)
Decrease your levothyroxine to 1/2 tablet or 25 mcg daily  Liberalize your carbohydrate intake  I will notify you of lab results and will see how you are doing

## 2018-06-11 NOTE — Progress Notes (Signed)
Subjective:    Patient ID: Alex Huffman, male    DOB: 27-Jun-1956, 62 y.o.   MRN: [id number]  HPI This is a 62 yo male who presents today with some dizziness and decreased blood pressure. Has felt heart racing and hot off and on for several weeks. Had sensation of presyncope and took his blood pressure and was 98/78, HR was in 80s. Ate and he felt a little better. Later took his blood pressure and it was 119/80 and HR was 90.  Currently feeling a little light headed and feels like his heart is pounding. Feeling heat intolerant. Bowel movements now every other day from every day. Thinks that symptoms started around the time he started taking levothyroxine 50 mcg. TSH 05/02/18 was mildly elevated at 4.55 and he was started on low dose levothyroxine. TSH after 1 month treatment on 06/02/18 was 0.99.  He denies any chest pain, states he feels a little winded with going up stairs and carrying heavy objects. Has been increasing exercise recently and is regaining his exercise tolerance. He has recently been restricting his food intake and particular his carbohydrates in an attempt to control HTN and elevated lipids. He is unsure how much water he has been consuming but reports that his urine has been light yellow to clear.    Past Medical History:  Diagnosis Date  . Cataract   . Chronic headaches   . Helicobacter pylori gastritis 2009   EGD  . Hyperlipemia   . Personal history of colonic polyps    Past Surgical History:  Procedure Laterality Date  . COLONOSCOPY  , 06/08/2011   2009: two 3 mm hyperplastic polyps, lipoma, internal hemorrjhoids 2012: 51mm cecal serrated adenoma, lipoma, internal hemorrhoids  . HERNIA REPAIR  05/04  . ROTATOR CUFF REPAIR  05/06  . UPPER GASTROINTESTINAL ENDOSCOPY  02/02/2008   erosive gastropathy   Family History  Problem Relation Age of Onset  . Heart disease Father   . Celiac disease Mother   . Colon cancer Neg Hx   . Esophageal cancer Neg Hx   . Liver cancer  Neg Hx   . Pancreatic cancer Neg Hx   . Rectal cancer Neg Hx   . Stomach cancer Neg Hx    Social History   Tobacco Use  . Smoking status: Never Smoker  . Smokeless tobacco: Never Used  Substance Use Topics  . Alcohol use: Not Currently    Comment: Occasional beer  . Drug use: No      Review of Systems Per HPI    Objective:   Physical Exam Physical Exam  Constitutional: Oriented to person, place, and time. He appears well-developed and well-nourished.  HENT:  Head: Normocephalic and atraumatic.  Eyes: Conjunctivae are normal.  Neck: Normal range of motion. Neck supple.  Cardiovascular: Normal rate, regular rhythm and normal heart sounds.   Pulmonary/Chest: Effort normal and breath sounds normal.  Musculoskeletal: No edema.  Neurological: Alert and oriented to person, place, and time.  Skin: Skin is warm and dry.  Psychiatric: Normal mood and affect. Behavior is normal. Judgment and thought content normal.  Vitals reviewed.     BP 96/60   Pulse 90   Temp 98.4 F (36.9 C) (Oral)   Ht 5' 6.5" (1.689 m)   Wt 176 lb 12 oz (80.2 kg)   SpO2 97%   BMI 28.10 kg/m  Wt Readings from Last 3 Encounters:  06/11/18 176 lb 12 oz (80.2 kg)  06/02/18 176 lb 12  oz (80.2 kg)  05/02/18 190 lb 12 oz (86.5 kg)   EKG- NSR rate 60 with 1 PAC    Assessment & Plan:  1. Palpitations - EKG reassuring, will check labs, he was instructed to decrease his levothyroxine to 1/2 tablet (25 mcg) daily - CBC with Differential - Comprehensive metabolic panel - EKG 17-CBSW  2. Lightheadedness - improves with eating, discussed with him his food restriction and weight loss and encouraged him to increase intake and add in more healthy carbs, hydrate adequately - CBC with Differential - Comprehensive metabolic panel - EKG 96-PRFF  3. Acquired hypothyroidism - he was instructed to decrease his levothyroxine to 25 mcg (1/2 of 50 mcg) - will recheck in 4-6 weeks  4. Weight loss - encouraged  him to liberalize diet   - follow up to be determined by labs and symptoms  Clarene Reamer, FNP-BC  [facility], [place]  06/12/2018 8:41 PM

## 2018-06-11 NOTE — Telephone Encounter (Signed)
Patient called and appointment made for this afternoon.

## 2018-06-11 NOTE — Telephone Encounter (Signed)
Attempted to contact pt gregarding; left message on voicemail [phone] for pt to call office; unable to complete triage at this time.

## 2018-06-12 ENCOUNTER — Encounter: Payer: Self-pay | Admitting: Family Medicine

## 2018-06-12 LAB — CBC WITH DIFFERENTIAL/PLATELET
BASOS ABS: 0.1 10*3/uL (ref 0.0–0.1)
Basophils Relative: 1.2 % (ref 0.0–3.0)
Eosinophils Absolute: 0.8 10*3/uL — ABNORMAL HIGH (ref 0.0–0.7)
Eosinophils Relative: 10.8 % — ABNORMAL HIGH (ref 0.0–5.0)
HCT: 42 % (ref 39.0–52.0)
Hemoglobin: 14.1 g/dL (ref 13.0–17.0)
Lymphocytes Relative: 29.6 % (ref 12.0–46.0)
Lymphs Abs: 2.1 10*3/uL (ref 0.7–4.0)
MCHC: 33.6 g/dL (ref 30.0–36.0)
MCV: 87.8 fl (ref 78.0–100.0)
MONO ABS: 0.5 10*3/uL (ref 0.1–1.0)
Monocytes Relative: 7 % (ref 3.0–12.0)
NEUTROS PCT: 51.4 % (ref 43.0–77.0)
Neutro Abs: 3.6 10*3/uL (ref 1.4–7.7)
Platelets: 216 10*3/uL (ref 150.0–400.0)
RBC: 4.78 Mil/uL (ref 4.22–5.81)
RDW: 12.8 % (ref 11.5–15.5)
WBC: 7.1 10*3/uL (ref 4.0–10.5)

## 2018-06-12 LAB — COMPREHENSIVE METABOLIC PANEL
ALK PHOS: 48 U/L (ref 39–117)
ALT: 25 U/L (ref 0–53)
AST: 20 U/L (ref 0–37)
Albumin: 4.4 g/dL (ref 3.5–5.2)
BILIRUBIN TOTAL: 0.6 mg/dL (ref 0.2–1.2)
BUN: 25 mg/dL — AB (ref 6–23)
CO2: 27 mEq/L (ref 19–32)
Calcium: 9.7 mg/dL (ref 8.4–10.5)
Chloride: 107 mEq/L (ref 96–112)
Creatinine, Ser: 1.26 mg/dL (ref 0.40–1.50)
GFR: 61.17 mL/min (ref >60.00)
GLUCOSE: 96 mg/dL (ref 70–99)
POTASSIUM: 4.5 meq/L (ref 3.5–5.1)
SODIUM: 142 meq/L (ref 135–145)
TOTAL PROTEIN: 6.7 g/dL (ref 6.0–8.3)

## 2018-06-13 ENCOUNTER — Encounter: Payer: Self-pay | Admitting: Family Medicine

## 2018-06-13 ENCOUNTER — Other Ambulatory Visit: Payer: Self-pay | Admitting: Family Medicine

## 2018-06-13 DIAGNOSIS — E039 Hypothyroidism, unspecified: Secondary | ICD-10-CM

## 2018-06-13 MED ORDER — LEVOTHYROXINE SODIUM 25 MCG PO TABS: 25.0000 ug | ORAL_TABLET | Freq: Every day | ORAL | 5 refills | Status: DC

## 2018-06-25 ENCOUNTER — Encounter: Payer: Self-pay | Admitting: Family Medicine

## 2018-07-17 DIAGNOSIS — H25813 Combined forms of age-related cataract, bilateral: Secondary | ICD-10-CM | POA: Diagnosis not present

## 2018-07-17 DIAGNOSIS — H4312 Vitreous hemorrhage, left eye: Secondary | ICD-10-CM | POA: Diagnosis not present

## 2018-07-17 DIAGNOSIS — H35411 Lattice degeneration of retina, right eye: Secondary | ICD-10-CM | POA: Diagnosis not present

## 2018-07-17 DIAGNOSIS — H33312 Horseshoe tear of retina without detachment, left eye: Secondary | ICD-10-CM | POA: Diagnosis not present

## 2018-07-23 ENCOUNTER — Telehealth: Payer: Self-pay

## 2018-07-23 NOTE — Telephone Encounter (Signed)
Copied from [place] [id number]. Topic: Appointment Scheduling - Scheduling Inquiry for Clinic >> Jul 23, 2018  2:13 PM Alex Huffman wrote: Reason for CRM:   Pt calling to find out when he is due for next Shingles vaccine and where he needs to go and get it.

## 2018-07-23 NOTE — Telephone Encounter (Signed)
Per DPR left detailed v.m that can get 2nd shingrix 2 - 4 months after 1st vaccine. Per immunization list received 1st shingrix 11/25/17. Pt should be able to get 2nd shingrix anytime now. Advised to ck with pharmacy that received first shingrix vaccine at; then ck with other pharmacies to see has shingrix available. We do not have shingrix at [facility]. Pt to cb if needed.

## 2018-07-24 DIAGNOSIS — Z23 Encounter for immunization: Secondary | ICD-10-CM | POA: Diagnosis not present

## 2018-07-25 ENCOUNTER — Encounter: Payer: Self-pay | Admitting: Family Medicine

## 2018-07-29 ENCOUNTER — Ambulatory Visit (INDEPENDENT_AMBULATORY_CARE_PROVIDER_SITE_OTHER): Payer: Federal, State, Local not specified - PPO | Admitting: Urology

## 2018-07-29 ENCOUNTER — Encounter: Payer: Self-pay | Admitting: Urology

## 2018-07-29 VITALS — BP 150/97 | HR 57 | Ht 67.0 in | Wt 170.1 lb

## 2018-07-29 DIAGNOSIS — N411 Chronic prostatitis: Secondary | ICD-10-CM

## 2018-07-29 DIAGNOSIS — R972 Elevated prostate specific antigen [PSA]: Secondary | ICD-10-CM

## 2018-07-29 DIAGNOSIS — N401 Enlarged prostate with lower urinary tract symptoms: Secondary | ICD-10-CM

## 2018-07-29 DIAGNOSIS — R339 Retention of urine, unspecified: Secondary | ICD-10-CM | POA: Diagnosis not present

## 2018-07-29 LAB — BLADDER SCAN AMB NON-IMAGING

## 2018-07-29 NOTE — Progress Notes (Signed)
07/29/2018 6:36 PM   [name] 1956-05-31 [id number]  Referring provider: Elby Beck, [place], [place] [address]  Chief Complaint  Patient presents with  . Establish Care    HPI: 62 year old male seen in consultation at the request of Clarene Reamer, FNP for evaluation of an elevated PSA.  A PSA in July 2019 was 5.18.  He saw Dr. Jacqlyn Larsen back in 2015 for a bump in his PSA to 4.3 while on testosterone replacement therapy.  A PSA was repeated and was 3.8.  A 23-month follow-up was recommended however he did not see Dr. Jacqlyn Larsen back.  Available PSA results are as follows: 03/2013  4.3 12/2013  3.8 02/2015  3.99 04/2018  5.18  Over the past several months he has noted worsening lower urinary tract symptoms including urinary hesitancy, intermittent urinary stream, straining to urinate and urinary frequency.  He denies dysuria or gross hematuria.  He denies flank, abdominal, pelvic or scrotal pain.  PMH: Past Medical History:  Diagnosis Date  . Anxiety   . Cataract   . Chronic headaches   . Helicobacter pylori gastritis 2009   EGD  . Hyperlipemia   . Personal history of colonic polyps     Surgical History: Past Surgical History:  Procedure Laterality Date  . COLONOSCOPY  , 06/08/2011   2009: two 3 mm hyperplastic polyps, lipoma, internal hemorrjhoids 2012: 46mm cecal serrated adenoma, lipoma, internal hemorrhoids  . HERNIA REPAIR  05/04  . ROTATOR CUFF REPAIR  05/06  . UPPER GASTROINTESTINAL ENDOSCOPY  02/02/2008   erosive gastropathy    Home Medications:  Allergies as of 07/29/2018      Reactions   Typhoid Vi Polysaccharide Vaccine Anaphylaxis, Other (See Comments)   Comatose, almost died   Amlodipine Other (See Comments)   Leg pain and fatigue   Doxycycline Other (See Comments)   Nitrofurantoin Other (See Comments), Nausea And Vomiting   Sulfa Antibiotics Hives      Medication List        Accurate as of 07/29/18  6:36 PM. Always use  your most recent med list.          aspirin 325 MG EC tablet Take 325 mg by mouth as needed for pain.   levothyroxine 25 MCG tablet Commonly known as:  SYNTHROID, LEVOTHROID Take 1 tablet (25 mcg total) by mouth daily.   multivitamin capsule Take 1 capsule by mouth daily.   naproxen sodium 220 MG tablet Commonly known as:  ALEVE Take 220 mg by mouth daily as needed.   OVER THE COUNTER MEDICATION Kyolic aged garlic extract, 081 mg one capsule 3 times daily.   OVER THE COUNTER MEDICATION Apple Cider Vineger, 15 ml 3 x a week.   OVER THE COUNTER MEDICATION Selenium, 100 mcg one to two a week.   OVER THE COUNTER MEDICATION Lutein 6 mg, One tablet two times a day.       Allergies:  Allergies  Allergen Reactions  . Typhoid Vi Polysaccharide Vaccine Anaphylaxis and Other (See Comments)    Comatose, almost died   . Amlodipine Other (See Comments)    Leg pain and fatigue  . Doxycycline Other (See Comments)  . Nitrofurantoin Other (See Comments) and Nausea And Vomiting  . Sulfa Antibiotics Hives    Family History: Family History  Problem Relation Age of Onset  . Heart disease Father   . Celiac disease Mother   . Colon cancer Neg Hx   . Esophageal  cancer Neg Hx   . Liver cancer Neg Hx   . Pancreatic cancer Neg Hx   . Rectal cancer Neg Hx   . Stomach cancer Neg Hx     Social History:  reports that he has never smoked. He has never used smokeless tobacco. He reports that he drank alcohol. He reports that he does not use drugs.  ROS: UROLOGY Frequent Urination?: Yes Hard to postpone urination?: No Burning/pain with urination?: Yes Get up at night to urinate?: Yes Leakage of urine?: No Urine stream starts and stops?: Yes Trouble starting stream?: Yes Do you have to strain to urinate?: Yes Blood in urine?: No Urinary tract infection?: No Sexually transmitted disease?: No Injury to kidneys or bladder?: No Painful intercourse?: No Weak stream?: Yes Erection  problems?: Yes Penile pain?: Yes  Gastrointestinal Nausea?: No Vomiting?: No Indigestion/heartburn?: No Diarrhea?: No Constipation?: Yes  Constitutional Fever: No Night sweats?: No Weight loss?: Yes Fatigue?: No  Skin Skin rash/lesions?: No Itching?: No  Eyes Blurred vision?: No Double vision?: No  Ears/Nose/Throat Sore throat?: No Sinus problems?: No  Hematologic/Lymphatic Swollen glands?: No Easy bruising?: No  Cardiovascular Leg swelling?: No Chest pain?: No  Respiratory Cough?: No Shortness of breath?: No  Endocrine Excessive thirst?: No  Musculoskeletal Back pain?: Yes Joint pain?: No  Neurological Headaches?: Yes Dizziness?: No  Psychologic Depression?: No Anxiety?: No  Physical Exam: BP (!) 150/97 (BP Location: Left Arm, Patient Position: Sitting, Cuff Size: Normal)   Pulse (!) 57   Ht 5\' 7"  (1.702 m)   Wt 170 lb 1.6 oz (77.2 kg)   BMI 26.64 kg/m   Constitutional:  Alert and oriented, No acute distress. HEENT: [place] AT, moist mucus membranes.  Trachea midline, no masses. Cardiovascular: No clubbing, cyanosis, or edema. Respiratory: Normal respiratory effort, no increased work of breathing. GI: Abdomen is soft, nontender, nondistended, no abdominal masses GU: No CVA tenderness.  Prostate 40 g, smooth without nodules Lymph: No cervical or inguinal lymphadenopathy. Skin: No rashes, bruises or suspicious lesions. Neurologic: Grossly intact, no focal deficits, moving all 4 extremities. Psychiatric: Normal mood and affect.  Laboratory Data:   Urinalysis Dipstick negative Microscopy negative  Assessment & Plan:   62 year old male with a mild PSA elevation and benign DRE.  He does have worsening lower urinary tract symptoms and urinalysis shows no evidence of infection.  I recommended a 30-day alpha-blocker course and a repeat PSA.  He has been on a previous alpha-blocker and had side effects but tolerated a second alpha-blocker.  He  thinks he has the one he tolerated at home and will call back.  Although PSA is a prostate cancer screening test he was informed that cancer is not the most common cause of an elevated PSA. Other potential causes including BPH and inflammation were discussed. He was informed that the only way to adequately diagnose prostate cancer would be a transrectal ultrasound and biopsy of the prostate. The procedure was discussed including potential risks of bleeding and infection/sepsis. He was also informed that a negative biopsy does not conclusively rule out the possibility that prostate cancer may be present and that continued monitoring is required. The use of newer adjunctive blood tests including PHI and 4kScore were discussed. The use of multiparametric prostate MRI was also discussed however is not typically used for initial evaluation of an elevated PSA. Continued periodic surveillance was also discussed.  I recommended a repeat PSA in 1 month and if still elevated would recommend either a prostate biopsy or  MRI.   Abbie Sons, MD  [facility] [address] [place], [place] [address] [phone]

## 2018-07-30 LAB — MICROSCOPIC EXAMINATION
Epithelial Cells (non renal): NONE SEEN /hpf (ref 0–10)
RBC MICROSCOPIC, UA: NONE SEEN /HPF (ref 0–2)

## 2018-07-30 LAB — URINALYSIS, COMPLETE
BILIRUBIN UA: NEGATIVE
GLUCOSE, UA: NEGATIVE
KETONES UA: NEGATIVE
Leukocytes, UA: NEGATIVE
Nitrite, UA: NEGATIVE
Protein, UA: NEGATIVE
RBC, UA: NEGATIVE
SPEC GRAV UA: 1.025 (ref 1.005–1.030)
Urobilinogen, Ur: 0.2 mg/dL (ref 0.2–1.0)
pH, UA: 5 (ref 5.0–7.5)

## 2018-07-30 LAB — PSA: PROSTATE SPECIFIC AG, SERUM: 4.9 ng/mL — AB (ref 0.0–4.0)

## 2018-07-31 ENCOUNTER — Ambulatory Visit: Payer: Federal, State, Local not specified - PPO

## 2018-07-31 ENCOUNTER — Telehealth: Payer: Self-pay | Admitting: Family Medicine

## 2018-07-31 ENCOUNTER — Encounter: Payer: Self-pay | Admitting: Urology

## 2018-07-31 NOTE — Telephone Encounter (Signed)
-----   Message from Abbie Sons, MD sent at 07/31/2018 10:03 AM EDT ----- I had told patient I was going to prescribe him an alpha-blocker for 30 days and then have his PSA repeated.  He stated there was one he could not tolerate and was going to call back to let us know which alpha-blocker he preferred.  Please contact

## 2018-07-31 NOTE — Telephone Encounter (Signed)
LMOM for patient to return call.

## 2018-08-01 NOTE — Telephone Encounter (Signed)
Patient notified of PSA results, he is going to call next week with the medication he cannot tolerate.

## 2018-08-01 NOTE — Telephone Encounter (Signed)
Pt lmom stating that he had PSA drawn on 10/22 hasn't heard from anyone about his results, Pt would like to be reached at [phone] with results. Thanks.

## 2018-08-03 ENCOUNTER — Encounter: Payer: Self-pay | Admitting: Urology

## 2018-08-03 ENCOUNTER — Encounter: Payer: Self-pay | Admitting: Family Medicine

## 2018-08-06 ENCOUNTER — Other Ambulatory Visit: Payer: Self-pay

## 2018-08-06 DIAGNOSIS — R972 Elevated prostate specific antigen [PSA]: Secondary | ICD-10-CM

## 2018-08-06 MED ORDER — SILODOSIN 8 MG PO CAPS: 8.0000 mg | ORAL_CAPSULE | Freq: Every day | ORAL | 1 refills | Status: DC

## 2018-10-13 DIAGNOSIS — H40013 Open angle with borderline findings, low risk, bilateral: Secondary | ICD-10-CM | POA: Diagnosis not present

## 2018-12-03 ENCOUNTER — Encounter: Payer: Self-pay | Admitting: Family Medicine

## 2018-12-03 DIAGNOSIS — E039 Hypothyroidism, unspecified: Secondary | ICD-10-CM

## 2018-12-04 ENCOUNTER — Other Ambulatory Visit: Payer: Self-pay | Admitting: Family Medicine

## 2018-12-04 DIAGNOSIS — E039 Hypothyroidism, unspecified: Secondary | ICD-10-CM

## 2018-12-04 MED ORDER — LEVOTHYROXINE SODIUM 25 MCG PO TABS: 25.0000 ug | ORAL_TABLET | Freq: Every day | ORAL | 1 refills | Status: DC

## 2018-12-08 ENCOUNTER — Ambulatory Visit: Payer: Federal, State, Local not specified - PPO | Admitting: Family Medicine

## 2018-12-18 DIAGNOSIS — H35411 Lattice degeneration of retina, right eye: Secondary | ICD-10-CM | POA: Diagnosis not present

## 2018-12-18 DIAGNOSIS — H4312 Vitreous hemorrhage, left eye: Secondary | ICD-10-CM | POA: Diagnosis not present

## 2018-12-18 DIAGNOSIS — H33312 Horseshoe tear of retina without detachment, left eye: Secondary | ICD-10-CM | POA: Diagnosis not present

## 2018-12-18 DIAGNOSIS — H40013 Open angle with borderline findings, low risk, bilateral: Secondary | ICD-10-CM | POA: Diagnosis not present

## 2019-01-09 ENCOUNTER — Other Ambulatory Visit: Payer: Self-pay | Admitting: Urology

## 2019-01-09 DIAGNOSIS — R972 Elevated prostate specific antigen [PSA]: Secondary | ICD-10-CM

## 2019-01-09 NOTE — Progress Notes (Signed)
Recommend a follow-up PSA either this month or next.  Order was entered

## 2019-02-19 ENCOUNTER — Encounter: Payer: Self-pay | Admitting: Family Medicine

## 2019-02-23 DIAGNOSIS — H35411 Lattice degeneration of retina, right eye: Secondary | ICD-10-CM | POA: Diagnosis not present

## 2019-02-23 DIAGNOSIS — H43811 Vitreous degeneration, right eye: Secondary | ICD-10-CM | POA: Diagnosis not present

## 2019-02-23 DIAGNOSIS — H33312 Horseshoe tear of retina without detachment, left eye: Secondary | ICD-10-CM | POA: Diagnosis not present

## 2019-02-23 DIAGNOSIS — H25813 Combined forms of age-related cataract, bilateral: Secondary | ICD-10-CM | POA: Diagnosis not present

## 2019-03-26 DIAGNOSIS — H35411 Lattice degeneration of retina, right eye: Secondary | ICD-10-CM | POA: Diagnosis not present

## 2019-03-26 DIAGNOSIS — H33311 Horseshoe tear of retina without detachment, right eye: Secondary | ICD-10-CM | POA: Diagnosis not present

## 2019-03-26 DIAGNOSIS — H33312 Horseshoe tear of retina without detachment, left eye: Secondary | ICD-10-CM | POA: Diagnosis not present

## 2019-03-26 DIAGNOSIS — H4312 Vitreous hemorrhage, left eye: Secondary | ICD-10-CM | POA: Diagnosis not present

## 2019-03-27 DIAGNOSIS — H25813 Combined forms of age-related cataract, bilateral: Secondary | ICD-10-CM | POA: Diagnosis not present

## 2019-03-27 DIAGNOSIS — H4312 Vitreous hemorrhage, left eye: Secondary | ICD-10-CM | POA: Diagnosis not present

## 2019-03-27 DIAGNOSIS — H33313 Horseshoe tear of retina without detachment, bilateral: Secondary | ICD-10-CM | POA: Diagnosis not present

## 2019-03-27 DIAGNOSIS — H43811 Vitreous degeneration, right eye: Secondary | ICD-10-CM | POA: Diagnosis not present

## 2019-03-27 DIAGNOSIS — H40013 Open angle with borderline findings, low risk, bilateral: Secondary | ICD-10-CM | POA: Diagnosis not present

## 2019-03-27 DIAGNOSIS — H33311 Horseshoe tear of retina without detachment, right eye: Secondary | ICD-10-CM | POA: Diagnosis not present

## 2019-03-27 DIAGNOSIS — H35372 Puckering of macula, left eye: Secondary | ICD-10-CM | POA: Diagnosis not present

## 2019-05-04 ENCOUNTER — Encounter: Payer: Self-pay | Admitting: Family Medicine

## 2019-05-04 ENCOUNTER — Other Ambulatory Visit: Payer: Self-pay

## 2019-05-04 ENCOUNTER — Ambulatory Visit (INDEPENDENT_AMBULATORY_CARE_PROVIDER_SITE_OTHER): Payer: Medicare Other | Admitting: Family Medicine

## 2019-05-04 VITALS — BP 157/89 | HR 59 | Temp 98.5°F

## 2019-05-04 DIAGNOSIS — J069 Acute upper respiratory infection, unspecified: Secondary | ICD-10-CM | POA: Diagnosis not present

## 2019-05-04 DIAGNOSIS — B9789 Other viral agents as the cause of diseases classified elsewhere: Secondary | ICD-10-CM

## 2019-05-04 DIAGNOSIS — Z20822 Contact with and (suspected) exposure to covid-19: Secondary | ICD-10-CM

## 2019-05-04 DIAGNOSIS — R6889 Other general symptoms and signs: Secondary | ICD-10-CM | POA: Diagnosis not present

## 2019-05-04 MED ORDER — BENZONATATE 100 MG PO CAPS: 100.0000 mg | ORAL_CAPSULE | Freq: Two times a day (BID) | ORAL | 0 refills | Status: DC | PRN

## 2019-05-04 NOTE — Progress Notes (Signed)
I connected with [name] on 05/04/19 at 11:20 AM EDT by video and verified that I am speaking with the correct person using two identifiers.   I discussed the limitations, risks, security and privacy concerns of performing an evaluation and management service by video and the availability of in person appointments. I also discussed with the patient that there may be a patient responsible charge related to this service. The patient expressed understanding and agreed to proceed.  Patient location: Home Provider Location: [place] Participants: Alex Huffman and MYKEL SPONAUGLE   Subjective:     Alex Huffman is a 63 y.o. male presenting for Cough (feverish feeling has been present since beginning of July. chest pressure, sore throat, chills, now has cough.)     Cough This is a new problem. The current episode started 1 to 4 weeks ago. The cough is productive of sputum (rare clear mucus, mostly dry). Associated symptoms include chest pain (chest pressure), ear congestion, a fever (tmax 99.2), headaches and a sore throat. Pertinent negatives include no chills, ear pain, myalgias, shortness of breath or wheezing. The symptoms are aggravated by lying down. Treatments tried: tylenol. The treatment provided mild relief. There is no history of environmental allergies.   Has an old prescription for tessalon pearls and they are expired - has not taken  Exposures: no large gatherings, no sick contact -- daughter is a Marine scientist and she was recently tested for COVID and was negative - has been around her  Wife w/o symptoms - and no other major contacts  Coughing worse with deep breath  Review of Systems  Constitutional: Positive for fever (tmax 99.2). Negative for chills.  HENT: Positive for sneezing and sore throat. Negative for ear pain, sinus pressure, sinus pain and trouble swallowing.        Swollen lymph nodes  Eyes: Positive for itching.  Respiratory: Positive for  cough. Negative for shortness of breath and wheezing.   Cardiovascular: Positive for chest pain (chest pressure).  Gastrointestinal: Positive for nausea. Negative for constipation, diarrhea and vomiting.  Musculoskeletal: Negative for arthralgias and myalgias.  Allergic/Immunologic: Negative for environmental allergies.  Neurological: Positive for headaches.     Social History   Tobacco Use  Smoking Status Never Smoker  Smokeless Tobacco Never Used        Objective:   BP Readings from Last 3 Encounters:  05/04/19 (!) 157/89  07/29/18 (!) 150/97  06/11/18 96/60   Wt Readings from Last 3 Encounters:  07/29/18 170 lb 1.6 oz (77.2 kg)  06/11/18 176 lb 12 oz (80.2 kg)  06/02/18 176 lb 12 oz (80.2 kg)    BP (!) 157/89 Comment: per patient  Pulse (!) 59 Comment: per patient  Temp 98.5 F (36.9 C) Comment: per patient  Physical Exam Constitutional:      Appearance: Normal appearance. He is not ill-appearing.  HENT:     Head: Normocephalic and atraumatic.     Right Ear: External ear normal.     Left Ear: External ear normal.  Eyes:     Conjunctiva/sclera: Conjunctivae normal.  Cardiovascular:     Rate and Rhythm: Bradycardia present.  Pulmonary:     Effort: Pulmonary effort is normal. No respiratory distress.     Comments: Cough which is triggered by deep breath Neurological:     Mental Status: He is alert. Mental status is at baseline.  Psychiatric:        Mood and Affect: Mood normal.  Behavior: Behavior normal.        Thought Content: Thought content normal.        Judgment: Judgment normal.          Assessment & Plan:   Problem List Items Addressed This Visit    None    Visit Diagnoses    Viral URI with cough    -  Primary   Relevant Medications   benzonatate (TESSALON) 100 MG capsule   Other Relevant Orders   Novel Coronavirus, NAA ([facility])   Suspected Covid-19 Virus Infection       Relevant Orders   Novel Coronavirus, NAA ([facility])      Discussed OTC treatment for viral illness Given symptoms possible covid-19 and would recommend being tested ER precautions given Advised staying out of work and isolating until results have come back Advised to go [facility] for testing   Also discussed that some of his symptoms may be consistent with allergies (sneezing, itchy/irritated eyes) (as patient initially felt that it was not a viral syndrome), however, patient also felt he did not have allergies. He planned to do some medication for viral symptoms. Advised f/u if worsening fevers or no improving in next week.    Return if symptoms worsen or fail to improve.  Alex Noe, MD

## 2019-05-05 NOTE — Telephone Encounter (Signed)
Dr. Einar Pheasant please review mychart message. I already responded to patient advising him to call us and we can get him scheduled for follow up so you do not need to address that part. Thank you

## 2019-05-06 ENCOUNTER — Encounter: Payer: Self-pay | Admitting: Family Medicine

## 2019-05-06 LAB — NOVEL CORONAVIRUS, NAA: SARS-CoV-2, NAA: NOT DETECTED

## 2019-05-08 NOTE — Telephone Encounter (Addendum)
FYI To PCP. Please review all the messages. 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 Also sending to [employer], radio as Juluis Rainier.

## 2019-05-31 ENCOUNTER — Other Ambulatory Visit: Payer: Self-pay | Admitting: Family Medicine

## 2019-05-31 DIAGNOSIS — E039 Hypothyroidism, unspecified: Secondary | ICD-10-CM

## 2019-06-01 ENCOUNTER — Encounter: Payer: Self-pay | Admitting: Family Medicine

## 2019-06-01 DIAGNOSIS — E039 Hypothyroidism, unspecified: Secondary | ICD-10-CM

## 2019-06-03 ENCOUNTER — Encounter: Payer: Self-pay | Admitting: Family Medicine

## 2019-06-03 NOTE — Telephone Encounter (Signed)
Chart updated to reflect recent vaccination.   Nothing further needed.

## 2019-08-26 ENCOUNTER — Encounter: Payer: Self-pay | Admitting: Family Medicine

## 2019-08-26 NOTE — Telephone Encounter (Signed)
Will send to [facility] as Juluis Rainier

## 2019-08-31 ENCOUNTER — Other Ambulatory Visit: Payer: Self-pay

## 2019-08-31 ENCOUNTER — Encounter: Payer: Self-pay | Admitting: Family Medicine

## 2019-08-31 ENCOUNTER — Ambulatory Visit (INDEPENDENT_AMBULATORY_CARE_PROVIDER_SITE_OTHER): Payer: Medicare Other | Admitting: Family Medicine

## 2019-08-31 VITALS — BP 154/98 | HR 55 | Temp 98.0°F | Ht 66.5 in | Wt 185.8 lb

## 2019-08-31 DIAGNOSIS — R635 Abnormal weight gain: Secondary | ICD-10-CM | POA: Diagnosis not present

## 2019-08-31 DIAGNOSIS — Z125 Encounter for screening for malignant neoplasm of prostate: Secondary | ICD-10-CM | POA: Diagnosis not present

## 2019-08-31 DIAGNOSIS — Z114 Encounter for screening for human immunodeficiency virus [HIV]: Secondary | ICD-10-CM

## 2019-08-31 DIAGNOSIS — R300 Dysuria: Secondary | ICD-10-CM | POA: Diagnosis not present

## 2019-08-31 DIAGNOSIS — R1013 Epigastric pain: Secondary | ICD-10-CM

## 2019-08-31 DIAGNOSIS — E039 Hypothyroidism, unspecified: Secondary | ICD-10-CM | POA: Diagnosis not present

## 2019-08-31 DIAGNOSIS — Z1159 Encounter for screening for other viral diseases: Secondary | ICD-10-CM

## 2019-08-31 DIAGNOSIS — E7800 Pure hypercholesterolemia, unspecified: Secondary | ICD-10-CM | POA: Diagnosis not present

## 2019-08-31 DIAGNOSIS — Z87898 Personal history of other specified conditions: Secondary | ICD-10-CM

## 2019-08-31 DIAGNOSIS — Z0000 Encounter for general adult medical examination without abnormal findings: Secondary | ICD-10-CM

## 2019-08-31 DIAGNOSIS — I10 Essential (primary) hypertension: Secondary | ICD-10-CM | POA: Diagnosis not present

## 2019-08-31 LAB — COMPREHENSIVE METABOLIC PANEL
ALT: 37 U/L (ref 0–53)
AST: 24 U/L (ref 0–37)
Albumin: 4 g/dL (ref 3.5–5.2)
Alkaline Phosphatase: 57 U/L (ref 39–117)
BUN: 22 mg/dL (ref 6–23)
CO2: 29 mEq/L (ref 19–32)
Calcium: 9.3 mg/dL (ref 8.4–10.5)
Chloride: 105 mEq/L (ref 96–112)
Creatinine, Ser: 1.11 mg/dL (ref 0.40–1.50)
GFR: 66.37 mL/min (ref >60.00)
Glucose, Bld: 99 mg/dL (ref 70–99)
Potassium: 4.8 mEq/L (ref 3.5–5.1)
Sodium: 141 mEq/L (ref 135–145)
Total Bilirubin: 0.5 mg/dL (ref 0.2–1.2)
Total Protein: 6.6 g/dL (ref 6.0–8.3)

## 2019-08-31 LAB — CBC WITH DIFFERENTIAL/PLATELET
Basophils Absolute: 0.1 10*3/uL (ref 0.0–0.1)
Basophils Relative: 1 % (ref 0.0–3.0)
Eosinophils Absolute: 0.7 10*3/uL (ref 0.0–0.7)
Eosinophils Relative: 12.4 % — ABNORMAL HIGH (ref 0.0–5.0)
HCT: 43.4 % (ref 39.0–52.0)
Hemoglobin: 14.2 g/dL (ref 13.0–17.0)
Lymphocytes Relative: 27.9 % (ref 12.0–46.0)
Lymphs Abs: 1.7 10*3/uL (ref 0.7–4.0)
MCHC: 32.8 g/dL (ref 30.0–36.0)
MCV: 89.8 fl (ref 78.0–100.0)
Monocytes Absolute: 0.4 10*3/uL (ref 0.1–1.0)
Monocytes Relative: 6.3 % (ref 3.0–12.0)
Neutro Abs: 3.2 10*3/uL (ref 1.4–7.7)
Neutrophils Relative %: 52.4 % (ref 43.0–77.0)
Platelets: 204 10*3/uL (ref 150.0–400.0)
RBC: 4.83 Mil/uL (ref 4.22–5.81)
RDW: 13.4 % (ref 11.5–15.5)
WBC: 6 10*3/uL (ref 4.0–10.5)

## 2019-08-31 LAB — POC URINALSYSI DIPSTICK (AUTOMATED)
Bilirubin, UA: NEGATIVE
Blood, UA: NEGATIVE
Glucose, UA: NEGATIVE
Ketones, UA: NEGATIVE
Leukocytes, UA: NEGATIVE
Nitrite, UA: NEGATIVE
Protein, UA: POSITIVE — AB
Spec Grav, UA: >=1.03 — AB (ref 1.010–1.025)
Urobilinogen, UA: 0.2 E.U./dL
pH, UA: 6 (ref 5.0–8.0)

## 2019-08-31 LAB — LIPID PANEL
Cholesterol: 221 mg/dL — ABNORMAL HIGH (ref 0–200)
HDL: 47.5 mg/dL (ref >39.00)
NonHDL: 173.47
Total CHOL/HDL Ratio: 5
Triglycerides: 259 mg/dL — ABNORMAL HIGH (ref 0.0–149.0)
VLDL: 51.8 mg/dL — ABNORMAL HIGH (ref 0.0–40.0)

## 2019-08-31 LAB — TSH: TSH: 2.96 u[IU]/mL (ref 0.35–4.50)

## 2019-08-31 LAB — PSA, MEDICARE: PSA: 4.97 ng/ml — ABNORMAL HIGH (ref 0.10–4.00)

## 2019-08-31 LAB — LDL CHOLESTEROL, DIRECT: Direct LDL: 57 mg/dL

## 2019-08-31 MED ORDER — FAMOTIDINE 20 MG PO TABS: 20.0000 mg | ORAL_TABLET | Freq: Two times a day (BID) | ORAL | 2 refills | Status: DC

## 2019-08-31 NOTE — Patient Instructions (Addendum)
  Alex Huffman , Thank you for taking time to come for your Medicare Wellness Visit. I appreciate your ongoing commitment to your health goals. Please review the following plan we discussed and let me know if I can assist you in the future.   These are the goals we discussed: Goals   Walk 5 days a week     This is a list of the screening recommended for you and due dates:  Health Maintenance  Topic Date Due  .  Hepatitis C: One time screening is recommended by Center for Disease Control  (CDC) for  adults born from [birth year] through 1967.   October 17, 1955  . HIV Screening  02/04/1969  . Tetanus Vaccine  02/04/1973  . Pneumonia vaccines (2 of 2 - PPSV23) 06/01/2020  . Colon Cancer Screening  01/18/2028  . Flu Shot  Completed   Get your Tdap at your pharmacy  I will notify you of labs and recommendations regarding lidocaine

## 2019-08-31 NOTE — Progress Notes (Signed)
Subjective:   Alex Huffman is a 63 y.o. male who presents for a Welcome to Medicare exam.  Chief Complaint  Patient presents with  . Medicare Wellness    Left sided rib pain, episodes of sharp pain, worse with deep breaths and at times hard to get a breath. Pt states that he cannot lay down when this happens. Pt does not recall injury. // Hx of H Pylori - having some GI issues // Labs to check TSH // CHeck a U/A    Review of Systems: Review of Systems  Constitutional: Negative.   HENT: Positive for ear pain (left, off and on, chronic, uses cotton swab, debrox multiple times per week).   Eyes: Negative.   Respiratory: Positive for cough (dry, intermittent, a couple of times a week) and shortness of breath (with mowing lawn, has stopped exercising and has gained weight). Negative for wheezing.   Cardiovascular: Negative.   Gastrointestinal: Positive for abdominal pain (upper, burning) and heartburn. Negative for constipation and diarrhea.       No diarrhea or constipation but bowel movements with increased odor.   Genitourinary: Positive for dysuria (intermittent). Negative for frequency, hematuria and urgency.  Musculoskeletal: Negative.   Skin: Negative.   Neurological: Negative.   Endo/Heme/Allergies: Negative.   Psychiatric/Behavioral: Negative.           Objective:     Physical Exam  Constitutional: He is oriented to person, place, and time and well-developed, well-nourished, and in no distress. No distress.  HENT:  Head: Normocephalic and atraumatic.  Right Ear: Tympanic membrane, external ear and ear canal normal.  Left Ear: Tympanic membrane, external ear and ear canal normal.  Nose: Nose normal.  Mouth/Throat: Oropharynx is clear and moist. No oropharyngeal exudate.  Eyes: Conjunctivae are normal.  Neck: Normal range of motion. Neck supple.  Cardiovascular: Normal rate, regular rhythm, normal heart sounds and intact distal pulses.  Pulmonary/Chest: Effort normal  and breath sounds normal.  Musculoskeletal:        General: No edema.  Lymphadenopathy:    He has no cervical adenopathy.  Neurological: He is alert and oriented to person, place, and time.  Skin: Skin is warm and dry. He is not diaphoretic.  Psychiatric: Mood, memory, affect and judgment normal.  Vitals reviewed.  Today's Vitals   08/31/19 0836  BP: (!) 154/98  Pulse: (!) 55  Temp: 98 F (36.7 C)  TempSrc: Temporal  SpO2: 96%  Weight: 185 lb 12.8 oz (84.3 kg)  Height: 5' 6.5" (1.689 m)   Body mass index is 29.54 kg/m.  Wt Readings from Last 3 Encounters:  08/31/19 185 lb 12.8 oz (84.3 kg)  07/29/18 170 lb 1.6 oz (77.2 kg)  06/11/18 176 lb 12 oz (80.2 kg)     Medications Outpatient Encounter Medications as of 08/31/2019  Medication Sig  . aspirin 325 MG EC tablet Take 325 mg by mouth as needed for pain.  Marland Kitchen levothyroxine (SYNTHROID) 25 MCG tablet Take 1 tablet by mouth once daily  . Multiple Vitamin (MULTIVITAMIN) capsule Take 1 capsule by mouth daily.    . naproxen sodium (ALEVE) 220 MG tablet Take 220 mg by mouth daily as needed.   Marland Kitchen OVER THE COUNTER MEDICATION Kyolic aged garlic extract, [id number] mg one capsule 3 times daily.  Marland Kitchen OVER THE COUNTER MEDICATION Apple Cider Vineger, 15 ml 3 x a week.  Marland Kitchen OVER THE COUNTER MEDICATION Selenium, 100 mcg one to two a week.  Marland Kitchen OVER THE COUNTER MEDICATION  Lutein 6 mg, One tablet two times a day.  . benzonatate (TESSALON) 100 MG capsule Take 1 capsule (100 mg total) by mouth 2 (two) times daily as needed for cough. (Patient not taking: Reported on 08/31/2019)   No facility-administered encounter medications on file as of 08/31/2019.      History: Past Medical History:  Diagnosis Date  . Anxiety   . Cataract   . Chronic headaches   . Helicobacter pylori gastritis 2009   EGD  . Hyperlipemia   . Personal history of colonic polyps    Past Surgical History:  Procedure Laterality Date  . COLONOSCOPY  , 06/08/2011   2009: two 3 mm  hyperplastic polyps, lipoma, internal hemorrjhoids 2012: 44mm cecal serrated adenoma, lipoma, internal hemorrhoids  . HERNIA REPAIR  05/04  . ROTATOR CUFF REPAIR  05/06  . UPPER GASTROINTESTINAL ENDOSCOPY  02/02/2008   erosive gastropathy    Family History  Problem Relation Age of Onset  . Heart disease Father   . Celiac disease Mother   . Colon cancer Neg Hx   . Esophageal cancer Neg Hx   . Liver cancer Neg Hx   . Pancreatic cancer Neg Hx   . Rectal cancer Neg Hx   . Stomach cancer Neg Hx    Social History   Occupational History  . Occupation: Retired  Tobacco Use  . Smoking status: Never Smoker  . Smokeless tobacco: Never Used  Substance and Sexual Activity  . Alcohol use: Not Currently    Comment: Occasional beer  . Drug use: No  . Sexual activity: Yes    Partners: Female    Birth control/protection: None   Tobacco Counseling Counseling given: Not Applicable   Immunizations and Health Maintenance Immunization History  Administered Date(s) Administered  . Influenza, High Dose Seasonal PF 05/28/2019  . Influenza, Quadrivalent, Recombinant, Inj, Pf 07/24/2018  . Influenza,inj,Quad PF,6+ Mos 09/16/2015, 07/08/2017  . Influenza,inj,quad, With Preservative 09/16/2015  . Influenza-Unspecified 08/01/2016, 05/28/2019  . Pneumococcal Conjugate-13 06/02/2019  . Zoster Recombinat (Shingrix) 12/02/2017, 07/29/2018   Health Maintenance Due  Topic Date Due  . Hepatitis C Screening  1955-10-31  . HIV Screening  02/04/1969  . TETANUS/TDAP  02/04/1973    Activities of Daily Living In your present state of health, do you have any difficulty performing the following activities: 08/31/2019  Hearing? N  Vision? N  Comment recent Retinal tear - right eye - no deficits  Difficulty concentrating or making decisions? N  Walking or climbing stairs? N  Dressing or bathing? N  Doing errands, shopping? N  Some recent data might be hidden    Advanced Directives: Patient reports  that he has Living Will and HCPOA. He would like to be rescesitated and put on a ventilator short term if he had a chance of recovery. He trusts his daughter who is a Marine scientist to make decisions if necessary.       Assessment:    This is a routine wellness  examination for this patient .  Vision/Hearing screen  Hearing Screening   125Hz  250Hz  500Hz  1000Hz  2000Hz  3000Hz  4000Hz  6000Hz  8000Hz   Right ear:   20 20 20  25     Left ear:   20 20 20  20       Visual Acuity Screening   Right eye Left eye Both eyes  Without correction: 20/30 20/40 20/20   With correction:       Dietary issues and exercise activities discussed:   Has been eating more and less  healthy food choices during Covid.   Goals   Increase vegetable intake and walking     Depression Screen PHQ 2/9 Scores 08/31/2019 06/02/2018 05/02/2018  PHQ - 2 Score 0 0 0     Fall Risk No falls in last year  Cognitive Function  Grossly intact with appropriate recall.       Patient Care Team: Elby Beck, FNP as PCP - General (Nurse Practitioner)     Plan:    1. Acquired hypothyroidism - CBC with Differential - Comprehensive metabolic panel - TSH  2. Elevated cholesterol - Comprehensive metabolic panel - Lipid Panel  3. Essential hypertension - CBC with Differential  4. History of elevated PSA - PSA  5. Weight gain - encouraged him to increase walking and make more consistently good food choices - Comprehensive metabolic panel - TSH  6. Dysuria - POCT Urinalysis Dipstick (Automated)  7. Screening for prostate cancer - PSA, Medicare  8. Epigastric pain - will start him on twice daily famotidine, and have encouraged him to work on weight loss - H. pylori antigen, stool  9. Screening for HIV without presence of risk factors - HIV antibody  10. Encounter for hepatitis C screening test for low risk patient - Hepatitis C Antibody   Clarene Reamer, FNP-BC  [facility],  [employer]  08/31/2019 11:37 AM   I have personally reviewed and noted the following in the patient's chart:   . Medical and social history . Use of alcohol, tobacco or illicit drugs  . Current medications and supplements . Functional ability and status . Nutritional status . Physical activity . Advanced directives . List of other physicians . Hospitalizations, surgeries, and ER visits in previous 12 months . Vitals . Screenings to include cognitive, depression, and falls . Referrals and appointments  In addition, I have reviewed and discussed with patient certain preventive protocols, quality metrics, and best practice recommendations. A written personalized care plan for preventive services as well as general preventive health recommendations were provided to patient.   This visit occurred during the SARS-CoV-2 public health emergency.  Safety protocols were in place, including screening questions prior to the visit, additional usage of staff PPE, and extensive cleaning of exam room while observing appropriate contact time as indicated for disinfecting solutions.    Elby Beck, FNP 08/31/2019

## 2019-08-31 NOTE — Addendum Note (Signed)
Addended by: Cloyd Stagers on: 08/31/2019 02:42 PM   Modules accepted: Orders

## 2019-09-01 ENCOUNTER — Other Ambulatory Visit: Payer: Medicare Other

## 2019-09-01 DIAGNOSIS — R300 Dysuria: Secondary | ICD-10-CM

## 2019-09-01 DIAGNOSIS — R1013 Epigastric pain: Secondary | ICD-10-CM

## 2019-09-01 DIAGNOSIS — Z1159 Encounter for screening for other viral diseases: Secondary | ICD-10-CM

## 2019-09-01 DIAGNOSIS — E039 Hypothyroidism, unspecified: Secondary | ICD-10-CM

## 2019-09-01 DIAGNOSIS — Z0000 Encounter for general adult medical examination without abnormal findings: Secondary | ICD-10-CM

## 2019-09-01 DIAGNOSIS — Z125 Encounter for screening for malignant neoplasm of prostate: Secondary | ICD-10-CM

## 2019-09-01 DIAGNOSIS — Z87898 Personal history of other specified conditions: Secondary | ICD-10-CM

## 2019-09-01 DIAGNOSIS — I10 Essential (primary) hypertension: Secondary | ICD-10-CM

## 2019-09-01 DIAGNOSIS — Z114 Encounter for screening for human immunodeficiency virus [HIV]: Secondary | ICD-10-CM

## 2019-09-01 DIAGNOSIS — E7800 Pure hypercholesterolemia, unspecified: Secondary | ICD-10-CM

## 2019-09-01 DIAGNOSIS — R635 Abnormal weight gain: Secondary | ICD-10-CM

## 2019-09-01 LAB — HEPATITIS C ANTIBODY
Hepatitis C Ab: NONREACTIVE
SIGNAL TO CUT-OFF: 0.01 (ref <1.00)

## 2019-09-01 LAB — HIV ANTIBODY (ROUTINE TESTING W REFLEX): HIV 1&2 Ab, 4th Generation: NONREACTIVE

## 2019-09-02 ENCOUNTER — Other Ambulatory Visit: Payer: Self-pay | Admitting: Family Medicine

## 2019-09-02 ENCOUNTER — Encounter: Payer: Self-pay | Admitting: Family Medicine

## 2019-09-02 DIAGNOSIS — E039 Hypothyroidism, unspecified: Secondary | ICD-10-CM

## 2019-09-02 LAB — HELICOBACTER PYLORI  SPECIAL ANTIGEN
MICRO NUMBER:: 1134719
SPECIMEN QUALITY: ADEQUATE

## 2019-09-02 MED ORDER — LEVOTHYROXINE SODIUM 25 MCG PO TABS: 25.0000 ug | ORAL_TABLET | Freq: Every day | ORAL | 3 refills | Status: DC

## 2019-09-02 NOTE — Telephone Encounter (Signed)
Please advise on lab results Alex Huffman.  Pt is needing his Thyroid medication refilled prior to the [place] weekend.

## 2019-09-07 ENCOUNTER — Telehealth: Payer: Self-pay | Admitting: Family Medicine

## 2019-09-07 NOTE — Telephone Encounter (Signed)
Called and spoke with pharmacy. Ok to Scientist, research (life sciences).

## 2019-09-07 NOTE — Telephone Encounter (Signed)
Do you know what this is about.  Was it a fax asking if okay to change manufactures??

## 2019-09-07 NOTE — Telephone Encounter (Signed)
[facility] stated they were returning a call in regards to a fax they sent over for the patient's Levothyroxine.  They stated that received a fax back but it was questioning the fax they sent wanting to change the levothyroxine.   Pharmacy # - [phone]

## 2019-09-11 ENCOUNTER — Other Ambulatory Visit: Payer: Self-pay | Admitting: Family Medicine

## 2019-09-11 DIAGNOSIS — T50905A Adverse effect of unspecified drugs, medicaments and biological substances, initial encounter: Secondary | ICD-10-CM

## 2019-09-11 NOTE — Progress Notes (Signed)
On previous office visit, patient brought note from his dentist about concern for local anesthesia.  He had previously been given 2% lidocaine with epi, 4% lidocaine with epi and mepivacaine.  He had an episode, according to the patient, of low blood pressure, increased heart rate, felt like he was passing out.  He reports 2 similar episodes when he had eye procedures.  I was able to see note from 03/26/2019 with Dr. Sherryll Burger.  He had been given some lidocaine prior to her procedure and had elevated blood pressure 211/95, 181/93 and what was described as a "vasovagal," episode.  We will send him for allergist appointment for questionable patch testing.

## 2019-11-02 ENCOUNTER — Encounter: Payer: Self-pay | Admitting: Family Medicine

## 2019-11-26 ENCOUNTER — Encounter: Payer: Self-pay | Admitting: Family Medicine

## 2019-11-27 ENCOUNTER — Encounter: Payer: Self-pay | Admitting: Family Medicine

## 2019-12-01 NOTE — Telephone Encounter (Signed)
Per Allergy and Ast[facility] scheduler, pt stated his dental pain is getting better.  Pt was offered 12-08-19 appt in [facility] and appt tomorrow in [place].  Pt doesn't want to go to those locations and wants to wait until 12-10-19 appt.

## 2019-12-02 NOTE — Telephone Encounter (Signed)
Noted  

## 2019-12-08 ENCOUNTER — Telehealth: Payer: Self-pay

## 2019-12-08 NOTE — Telephone Encounter (Signed)
Pt calling to check to see what to expect with his visit on Thursday.  Pt would like [place] to be aware that his blood pressure increases, he has vasovagel, increased heart rate, felt like he was passing out.  Also, has episodes where is has been catatonic and unresponsive after being injected with lidocaine.  Just wanted [place] to be aware. Explained to patient that we do have epinephrine on stand by if needed. Our staff is well prepared to handle an emergency allergic reaction if necessary.  We would call 911 if needed, pt states that he is just nervous about what will happen and what has previously happened to him.

## 2019-12-09 NOTE — Telephone Encounter (Signed)
I talked to this patient's PCP over Epic.  We are well prepared to deal with any anaphylaxis.  Salvatore Marvel, MD [facility] [place] [facility] [place]

## 2019-12-10 ENCOUNTER — Encounter: Payer: Self-pay | Admitting: Allergy & Immunology

## 2019-12-10 ENCOUNTER — Ambulatory Visit (INDEPENDENT_AMBULATORY_CARE_PROVIDER_SITE_OTHER): Payer: Medicare Other | Admitting: Allergy & Immunology

## 2019-12-10 ENCOUNTER — Other Ambulatory Visit: Payer: Self-pay

## 2019-12-10 VITALS — BP 164/90 | HR 58 | Temp 97.4°F | Resp 16 | Ht 66.5 in | Wt 194.0 lb

## 2019-12-10 DIAGNOSIS — T7840XD Allergy, unspecified, subsequent encounter: Secondary | ICD-10-CM

## 2019-12-10 NOTE — Progress Notes (Addendum)
NEW PATIENT  Date of Service/Encounter:  12/10/19  Referring provider: Elby Beck, FNP   Assessment:   Local anesthetic reaction   Plan/Recommendations:   1. Allergic reaction (local anesthetics) -We did testing today to mepivacaine which was completely negative. -He did tolerate all of the doses without adverse event. -The skin testing rules out an IgE or anaphylactic reaction. -However, you could still have other symptoms such as increased anxiety or vasovagal symptoms. -I do not think this precludes you from getting the medication in the future, but you just need to be aware that you can have other reactions to these medications even in the absence of a true IgE mediated reaction. -I think the best coruse of action is to talk to your oral surgeon about other sedative techniques, such as nitrous oxide (laughing gas). -You could use the nitrous oxide and the local anesthetics together to decrease the amount of the local anesthetic you will need for the procedure. -Conscious sedation with versed or other medications is something to consider as well. -We did obtain a tryptase today, which is elevated during allergic reactions. -We will call you with the results of this test, which will also help clarify the reactions you are having.  -We have lidocaine and bupivacaine to test in our office, but I would prefer to avoid lidocaine since this was a common trigger before your previous reactions. -If you are interested in additional testing, we could even get samples of the local anesthetic from your oral surgeon to do pre-procedure testing.   2. Talk to your oral surgeon and decide what you want to do going forward. You can call and we can do additional testing, if needed.   Subjective:   ELOISE RAFIQUE is a 64 y.o. male presenting today for evaluation of  Chief Complaint  Patient presents with  . Food/Drug Challenge    [employer]    WEBER AMADO has a history of  the following: Patient Active Problem List   Diagnosis Date Noted  . Combined forms of age-related cataract of both eyes 12/06/2016  . Bradycardia 05/09/2016  . Retinal tear of left eye 07/07/2015  . Herniated lumbar disc without myelopathy 10/21/2014  . Benign localized prostatic hyperplasia with lower urinary tract symptoms (LUTS) 10/15/2012  . Chronic prostatitis 10/15/2012  . ED (erectile dysfunction) of organic origin 10/15/2012  . Elevated prostate specific antigen (PSA) 10/15/2012  . Incomplete emptying of bladder 10/15/2012  . Personal history of colonic polyps 06/15/2011  . Hemorrhoids, internal, with bleeding 05/28/2011  . Change in bowel habits, narrow caliber stools 05/28/2011  . RUQ pain 05/28/2011    History obtained from: chart review and patient.  [name] was referred by Elby Beck, FNP.     Laydon is a 64 y.o. male presenting for an evaluation of possible allergic reactions.  He has noticed a "trend" in reactions that is happening with procedures that he is having to undergo. He feels that symptoms initially started in 2011 or so. He had a root canal done in [place]. During the procedure, he noticed that after giving. He lists several "caines" from his notes that he took and he is unsure what he was given at the procedure. Apparently this dentist listed a bunch of different "caines" and was no specific identifying what it was. He tells me that he became "lethargic" and "unresponsive" and in a "frozen state". He tells me that he could not move and he could respond to  questions. This was the 2011 reaction. He does not remember the sequelae, but he did recover. He did not receive medications from it at all.  He He moved to [place] in 2014 and underwent surgery for a detached regina. He underwent surgery in April 2014 in [place].  He had a reaction at that time with the numbing medication, which apparently was lidocaine with epinephrine. He tells  me that a needle went into his eye and he developed a "hot rushed feeling" and broke out into a sweat. He became non-responsive again. He went through the surgery and had the retina re-attached. He is unsure of the time duration, but he tells me that he was dizzy and felt terrible. He is unsure what he received post-reaction for this episode.   His third episode occurred here in [place]. He had an issue with his right eye. 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 He received lidocaine 2%. When he did that, he passed out and had a warm feeling over his body. He reports that his vision and hearing became more enhanced. He could not speak, move, and was ina frozen state again. He could hear and see what was going on around him. The surgery was aborted and his eyes were fixed a few days later with a laser instead.   He is going to have to undergo another root canal. He went back to his main dentist and then to another oral surgeon. He was given amoxicillin for seven days for an abscess. This has helped it, but the tooth is cracked he needs to have the tooth removed. There seem to be a lot of holes in the history. He needs to have this tooth fixed and needs to know what is causing the reactions.   Otherwise, there is no history of other atopic diseases, including asthma, food allergies, drug allergies, stinging insect allergies or contact dermatitis. There is no significant infectious history. Vaccinations are up to date.    Past Medical History: Patient Active Problem List   Diagnosis Date Noted  . Combined forms of age-related cataract of both eyes 12/06/2016  . Bradycardia 05/09/2016  . Retinal tear of left eye 07/07/2015  . Herniated lumbar disc without myelopathy 10/21/2014  . Benign localized prostatic hyperplasia with lower urinary tract symptoms (LUTS) 10/15/2012  . Chronic prostatitis 10/15/2012  . ED (erectile dysfunction) of organic origin 10/15/2012  . Elevated prostate specific  antigen (PSA) 10/15/2012  . Incomplete emptying of bladder 10/15/2012  . Personal history of colonic polyps 06/15/2011  . Hemorrhoids, internal, with bleeding 05/28/2011  . Change in bowel habits, narrow caliber stools 05/28/2011  . RUQ pain 05/28/2011    Medication List:  Allergies as of 12/10/2019      Reactions   Lidocaine Hypertension   Mepivacaine Hypertension   Typhoid Vi Polysaccharide Vaccine Anaphylaxis, Other (See Comments)   Comatose, almost died   Amlodipine Other (See Comments)   Leg pain and fatigue   Doxycycline Other (See Comments)   Nitrofurantoin Other (See Comments), Nausea And Vomiting   Sulfa Antibiotics Hives      Medication List       Accurate as of December 10, 2019 12:44 PM. If you have any questions, ask your nurse or doctor.        aspirin 325 MG EC tablet Take 325 mg by mouth as needed for pain.   calcium carbonate 1250 (500 Ca) MG tablet Commonly known as: OS-CAL - dosed in mg of elemental calcium  Take by mouth.   famotidine 20 MG tablet Commonly known as: PEPCID Take 1 tablet (20 mg total) by mouth 2 (two) times daily.   levothyroxine 25 MCG tablet Commonly known as: SYNTHROID Take 1 tablet (25 mcg total) by mouth daily.   multivitamin capsule Take 1 capsule by mouth daily.   naproxen sodium 220 MG tablet Commonly known as: ALEVE Take 220 mg by mouth daily as needed.   OVER THE COUNTER MEDICATION Kyolic aged garlic extract, [id number] mg one capsule 3 times daily.   OVER THE COUNTER MEDICATION Apple Cider Vineger, 15 ml 3 x a week.   OVER THE COUNTER MEDICATION Selenium, 100 mcg one to two a week.   OVER THE COUNTER MEDICATION Lutein 6 mg, One tablet two times a day.   vitamin E 1000 UNIT capsule Take by mouth.       Birth History: non-contributory  Developmental History: non-contributory  Past Surgical History: Past Surgical History:  Procedure Laterality Date  . COLONOSCOPY  , 06/08/2011   2009: two 3 mm hyperplastic  polyps, lipoma, internal hemorrjhoids 2012: 62mm cecal serrated adenoma, lipoma, internal hemorrhoids  . HERNIA REPAIR  05/04  . ROTATOR CUFF REPAIR  05/06  . UPPER GASTROINTESTINAL ENDOSCOPY  02/02/2008   erosive gastropathy     Family History: Family History  Problem Relation Age of Onset  . Heart disease Father   . Celiac disease Mother   . Colon cancer Neg Hx   . Esophageal cancer Neg Hx   . Liver cancer Neg Hx   . Pancreatic cancer Neg Hx   . Rectal cancer Neg Hx   . Stomach cancer Neg Hx   . Allergic rhinitis Neg Hx      Social History: Trellis lives at home with his wife.  He is a retired Company secretary.  He then worked on the [employer].  He is now retired from that as well.  They live in a house that is 66 years old.  There is hardwood in the main living areas and carpeting in the bedroom.  They have gas, electric, and heat pumps for heating and central and heat pumps for cooling.  There are no animals inside or outside of the home.  He does have dust mite coverings on the bed and the pillows.   Review of Systems  Constitutional: Negative.  Negative for chills, fever, malaise/fatigue and weight loss.  HENT: Negative.  Negative for congestion, ear discharge and ear pain.   Eyes: Negative for pain, discharge and redness.  Respiratory: Negative for cough, sputum production, shortness of breath and wheezing.   Cardiovascular: Negative.  Negative for chest pain and palpitations.  Gastrointestinal: Negative for abdominal pain, constipation, diarrhea, heartburn, nausea and vomiting.  Skin: Negative.  Negative for itching and rash.  Neurological: Negative for dizziness and headaches.  Endo/Heme/Allergies: Negative for environmental allergies. Does not bruise/bleed easily.       Objective:   Blood pressure (!) 144/110, pulse 69, temperature (!) 97.4 F (36.3 C), temperature source Temporal, resp. rate 16, height 5' 6.5" (1.689 m), weight 194 lb (88 kg), SpO2 97  %. Body mass index is 30.84 kg/m.   Physical Exam:   Physical Exam  Constitutional: He appears well-developed.  HENT:  Head: Normocephalic and atraumatic.  Right Ear: Tympanic membrane, external ear and ear canal normal. No drainage, swelling or tenderness. Tympanic membrane is not injected, not scarred, not erythematous, not retracted and not bulging.  Left Ear: Tympanic membrane, external ear  and ear canal normal. No drainage, swelling or tenderness. Tympanic membrane is not injected, not scarred, not erythematous, not retracted and not bulging.  Nose: Mucosal edema and rhinorrhea present. No nasal deformity or septal deviation. No epistaxis. Right sinus exhibits no maxillary sinus tenderness and no frontal sinus tenderness. Left sinus exhibits no maxillary sinus tenderness and no frontal sinus tenderness.  Mouth/Throat: Uvula is midline and oropharynx is clear and moist. Mucous membranes are not pale and not dry.  There is some minimal cobblestoning in the posterior oropharynx.   Eyes: Pupils are equal, round, and reactive to light. Conjunctivae and EOM are normal. Right eye exhibits no chemosis and no discharge. Left eye exhibits no chemosis and no discharge. Right conjunctiva is not injected. Left conjunctiva is not injected.  Cardiovascular: Normal rate, regular rhythm and normal heart sounds.  Respiratory: Effort normal and breath sounds normal. No accessory muscle usage. No tachypnea. No respiratory distress. He has no wheezes. He has no rhonchi. He has no rales. He exhibits no tenderness.  Lymphadenopathy:    He has no cervical adenopathy.  Neurological: He is alert.  Skin: No abrasion, no petechiae and no rash noted. Rash is not papular, not vesicular and not urticarial. No erythema. No pallor.  Psychiatric: He has a normal mood and affect.     Diagnostic studies:    Local Anesthetic Percutaneous Testing (mepivacaine) Control SPT: negative Histamine SPT: 2+ SPT:  negative  Local Anesthetic Intradermal Testing (mepivacaine) Control ID: negative  1/100 ID: negative 1/10 ID: negative Full strength ID: negative  Local Anesthetic Subcutaneous Challenge Testing (mepivacaine) 0.61mL full strength: negative 0.3mL full strength: negative 1.73mL full strength: negative   Allergy testing results were read and interpreted by myself, documented by clinical staff. See scanned flow sheet for full results and vitals.          Salvatore Marvel, MD [facility]

## 2019-12-10 NOTE — Patient Instructions (Addendum)
1. Allergic reaction (local anesthetics) -We did testing today to mepivacaine which was completely negative. -He did tolerate all of the doses without adverse event. -The skin testing rules out an IgE or anaphylactic reaction. -However, you could still have other symptoms such as increased anxiety or vasovagal symptoms. -I do not think this precludes you from getting the medication in the future, but you just need to be aware that you can have other reactions to these medications even in the absence of a true IgE mediated reaction. -I think the best coruse of action is to talk to your oral surgeon about other sedative techniques, such as nitrous oxide (laughing gas). -You could use the nitrous oxide and the local anesthetics together to decrease the amount of the local anesthetic you will need for the procedure. -Conscious sedation with versed or other medications is something to consider as well. -We did obtain a tryptase today, which is elevated during allergic reactions. -We will call you with the results of this test, which will also help clarify the reactions you are having.  -We have lidocaine and bupivacaine to test in our office, but I would prefer to avoid lidocaine since this was a common trigger before your previous reactions. -If you are interested in additional testing, we could even get samples of the local anesthetic from your oral surgeon to do pre-procedure testing.   2. Talk to your oral surgeon and decide what you want to do going forward. You can call and we can do additional testing, if needed.    Please inform us of any Emergency Department visits, hospitalizations, or changes in symptoms. Call us before going to the ED for breathing or allergy symptoms since we might be able to fit you in for a sick visit. Feel free to contact us anytime with any questions, problems, or concerns.  It was a pleasure to meet you today!  Websites that have reliable patient information: 1.  American Academy of Asthma, Allergy, and Immunology: www.aaaai.org 2. Food Allergy Research and Education (FARE): foodallergy.org 3. Mothers of Asthmatics: http://www.asthmacommunitynetwork.org 4. American College of Allergy, Asthma, and Immunology: www.acaai.org   COVID-19 Vaccine Information can be found at: ShippingScam.co.uk For questions related to vaccine distribution or appointments, please email [email or web address] .[email or web address] or call [phone].     "Like" [place] on Facebook and Instagram for our latest updates!        Make sure you are registered to vote! If you have moved or changed any of your contact information, you will need to get this updated before voting!  In some cases, you MAY be able to register to vote online: [email or web address]    Symptoms of vasovagal episodes-- The classic prodromal symptoms associated with imminent syncope and presyncope, particularly in the case of the vasovagal form of reflex syncope, include:  Lightheadedness A feeling of being warm or cold Sweating Palpitations Nausea or non-specific abdominal discomfort Visual "blurring" occasionally proceeding to temporary darkening or "white-out" of vision Diminution of hearing and/or occurrence of unusual sounds (particularly a "whooshing" noise) Pallor reported by onlookers Fatigue after recovery

## 2019-12-12 LAB — TRYPTASE: Tryptase: 8.6 ug/L (ref 2.2–13.2)

## 2019-12-15 ENCOUNTER — Encounter: Payer: Self-pay | Admitting: Allergy & Immunology

## 2019-12-16 ENCOUNTER — Telehealth: Payer: Self-pay

## 2019-12-16 NOTE — Telephone Encounter (Signed)
Received medical records from [facility] Dr. Rella Larve. Placed on Dr. Ernst Bowler desk.

## 2019-12-26 ENCOUNTER — Encounter: Payer: Self-pay | Admitting: Family Medicine

## 2020-03-11 ENCOUNTER — Encounter: Payer: Self-pay | Admitting: Family Medicine

## 2020-03-14 ENCOUNTER — Other Ambulatory Visit: Payer: Self-pay | Admitting: Family Medicine

## 2020-03-14 DIAGNOSIS — T50905A Adverse effect of unspecified drugs, medicaments and biological substances, initial encounter: Secondary | ICD-10-CM

## 2020-03-16 NOTE — Telephone Encounter (Signed)
Referral sent to Dr. Evern Bio.

## 2020-08-11 ENCOUNTER — Other Ambulatory Visit: Payer: Self-pay | Admitting: Family Medicine

## 2020-08-11 DIAGNOSIS — E039 Hypothyroidism, unspecified: Secondary | ICD-10-CM

## 2020-08-12 NOTE — Telephone Encounter (Signed)
Pharmacy requests refill on: Levothyroxine 25 mcg  LAST REFILL: 09/02/2019 LAST OV: 08/31/2019 NEXT OV: Not Scheduled. Message sent to front desk to get CPE scheduled. PHARMACY: [facility]   Last TSH (08/31/2019): 2.96

## 2020-08-12 NOTE — Telephone Encounter (Signed)
Medicare 1/4 cpx 1/12   Pt wanted to update his chart He stated he received his 2nd pneumonia @ [facility] [place]   He wasn't for date he received it about a month

## 2020-08-12 NOTE — Telephone Encounter (Signed)
Documented pneumonia vaccine.

## 2020-09-07 ENCOUNTER — Telehealth (INDEPENDENT_AMBULATORY_CARE_PROVIDER_SITE_OTHER): Payer: Medicare Other | Admitting: Family Medicine

## 2020-09-07 ENCOUNTER — Encounter: Payer: Self-pay | Admitting: Family Medicine

## 2020-09-07 ENCOUNTER — Telehealth: Payer: Self-pay | Admitting: Family Medicine

## 2020-09-07 ENCOUNTER — Other Ambulatory Visit: Payer: Medicare Other

## 2020-09-07 VITALS — BP 168/101 | HR 64 | Temp 99.0°F | Wt 180.0 lb

## 2020-09-07 DIAGNOSIS — J069 Acute upper respiratory infection, unspecified: Secondary | ICD-10-CM

## 2020-09-07 DIAGNOSIS — J0100 Acute maxillary sinusitis, unspecified: Secondary | ICD-10-CM

## 2020-09-07 DIAGNOSIS — R030 Elevated blood-pressure reading, without diagnosis of hypertension: Secondary | ICD-10-CM | POA: Diagnosis not present

## 2020-09-07 MED ORDER — BENZONATATE 100 MG PO CAPS: 100.0000 mg | ORAL_CAPSULE | Freq: Three times a day (TID) | ORAL | 0 refills | Status: DC | PRN

## 2020-09-07 MED ORDER — AMOXICILLIN 500 MG PO CAPS: 1000.0000 mg | ORAL_CAPSULE | Freq: Three times a day (TID) | ORAL | 0 refills | Status: AC

## 2020-09-07 NOTE — Telephone Encounter (Signed)
Patient called back in and scheduled virtual with pcp at 330.

## 2020-09-07 NOTE — Telephone Encounter (Signed)
Called patient to schedule virtual visit. LVM to call back.

## 2020-09-07 NOTE — Progress Notes (Signed)
Virtual Visit via Video Note  I connected with Alex Huffman on 09/07/20 at  3:30 PM EST by a video enabled telemedicine application and verified that I am speaking with the correct person using two identifiers.  Location: Patient: In his home Provider: [place] Persons participating in virtual visit: Patient, provider   I discussed the limitations of evaluation and management by telemedicine and the availability of in person appointments. The patient expressed understanding and agreed to proceed.  History of Present Illness: Chief Complaint  Patient presents with  . Cough    all symptoms since saturday   . Headache  . Sore Throat  . Fever  . Ear Pain    L  . Nasal Congestion    w/ green discharge    This is a 64 yo male who presents today for virtual video visit for above cc. He  has a past medical history of Anxiety, Cataract, Chronic headaches, Helicobacter pylori gastritis (2009), Hyperlipemia, and Personal history of colonic polyps.  Symptoms for 5 days, started with sneezing, pounding headache.  Progressed to cough with a little green mucus, no wheeze, maybe a little shortness of breath.  He has had a runny nose with green nasal drainage, sinus pressure, worse over left eye, teeth hurt, low-grade temps 99.8, dull left ear pain.  Denies loss of taste or smell.  He was vaccinated in the spring for COVID-19 with [employer].  Has not received booster. History of borderline blood pressure, he reports he has been checking it at home and prior to acute illness was running 140-145/89-92   Observations/Objective: Patient is alert and answers questions appropriately.  Visible skin is unremarkable.  He is normally conversive without increased work of breathing with conversation.  He does sound mildly congested and he did have occasional nonproductive cough.  Respirations even and unlabored.  Conjunctiva clear.  Mood and affect are appropriate. BP (!) 168/101   Pulse 64    Temp 99 F (37.2 C) (Temporal)   Wt 180 lb (81.6 kg)   BMI 28.62 kg/m  BP Readings from Last 3 Encounters:  09/07/20 (!) 168/101  12/10/19 (!) 164/90  08/31/19 (!) 154/98    Assessment and Plan: 1. Acute non-recurrent maxillary sinusitis -We will go ahead and treat with antibiotic given duration and severity of symptoms, advised him to take Mucinex in addition to thin secretions, encouraged good fluid intake - amoxicillin (AMOXIL) 500 MG capsule; Take 2 capsules (1,000 mg total) by mouth every 8 (eight) hours for 5 days.  Dispense: 30 capsule; Refill: 0  2. URI with cough and congestion -Per #1, have advised him to be tested ASAP for COVID-19 and he agrees and will do rapid test today, if positive he will let me know and referral for monoclonal antibody infusion will be made - benzonatate (TESSALON) 100 MG capsule; Take 1-2 capsules (100-200 mg total) by mouth 3 (three) times daily as needed.  Dispense: 30 capsule; Refill: 0  3. Elevated blood pressure reading -Concern for elevated reading, unsure if worse due to acute illness, he will continue to monitor his blood pressure and report readings   Clarene Reamer, FNP-BC  [facility]  09/07/2020 4:12 PM   Follow Up Instructions:    I discussed the assessment and treatment plan with the patient. The patient was provided an opportunity to ask questions and all were answered. The patient agreed with the plan and demonstrated an understanding of  the instructions.   The patient was advised to call back or seek an in-person evaluation if the symptoms worsen or if the condition fails to improve as anticipated.   Elby Beck, FNP

## 2020-09-07 NOTE — Telephone Encounter (Signed)
Please call patient and offer him a virtual visit this am.

## 2020-09-07 NOTE — Telephone Encounter (Signed)
Patient called in stating he is having a cough, sinus issues, mucus, ear ache, teeth hurting, headaches, etc. Believes he has a sinus infection and wants to be called in some medication asap as he is going out of town Friday. Please advise. Declined visit at this time.

## 2020-09-09 ENCOUNTER — Other Ambulatory Visit: Payer: Self-pay | Admitting: Family Medicine

## 2020-09-09 ENCOUNTER — Encounter: Payer: Self-pay | Admitting: Family Medicine

## 2020-09-09 DIAGNOSIS — J069 Acute upper respiratory infection, unspecified: Secondary | ICD-10-CM

## 2020-09-09 MED ORDER — GUAIFENESIN-CODEINE 100-10 MG/5ML PO SYRP: 5.0000 mL | ORAL_SOLUTION | Freq: Three times a day (TID) | ORAL | 0 refills | Status: DC | PRN

## 2020-09-16 ENCOUNTER — Encounter: Payer: Self-pay | Admitting: Family Medicine

## 2020-10-10 ENCOUNTER — Telehealth: Payer: Self-pay

## 2020-10-10 NOTE — Telephone Encounter (Signed)
Pt left v/m requesting cb about phone appt pt has scheduled on 10/11/20 at 3:30 with nurse advisor for medicare wellness. Pt has TOC for CPX with DR Ermalene Searing on 10/22/19 at 3 PM. I tried calling pt back and left v/m for pt to call [phone] opt 1 to discuss question about appt.

## 2020-10-11 ENCOUNTER — Ambulatory Visit (INDEPENDENT_AMBULATORY_CARE_PROVIDER_SITE_OTHER): Payer: Medicare Other

## 2020-10-11 DIAGNOSIS — Z0000 Encounter for general adult medical examination without abnormal findings: Secondary | ICD-10-CM | POA: Diagnosis not present

## 2020-10-11 NOTE — Telephone Encounter (Signed)
Spoke with patient confirmed appts

## 2020-10-11 NOTE — Progress Notes (Signed)
PCP notes:  Health Maintenance: Flu- due Covid booster- due   Abnormal Screenings: none   Patient concerns: none   Nurse concerns: none   Next PCP appt.: 10/21/2020 @ 3 pm

## 2020-10-11 NOTE — Progress Notes (Signed)
Subjective:   Alex Huffman is a 65 y.o. male who presents for Medicare Annual/Subsequent preventive examination.  Review of Systems: N/A      I connected with the patient today by telephone and verified that I am speaking with the correct person using two identifiers. Location patient: home Location nurse: work Persons participating in the telephone visit: patient, nurse.   I discussed the limitations, risks, security and privacy concerns of performing an evaluation and management service by telephone and the availability of in person appointments. I also discussed with the patient that there may be a patient responsible charge related to this service. The patient expressed understanding and verbally consented to this telephonic visit.        Cardiac Risk Factors include: advanced age (>6men, >73 women);male gender     Objective:    Today's Vitals   There is no height or weight on file to calculate BMI.  Advanced Directives 10/11/2020  Does Patient Have a Medical Advance Directive? Yes  Type of Estate agent of [place];Living will  Copy of Healthcare Power of Attorney in Chart? No - copy requested    Current Medications (verified) Outpatient Encounter Medications as of 10/11/2020  Medication Sig  . aspirin 325 MG EC tablet Take 325 mg by mouth as needed for pain.  . benzonatate (TESSALON) 100 MG capsule Take 1-2 capsules (100-200 mg total) by mouth 3 (three) times daily as needed.  . calcium carbonate (OS-CAL - DOSED IN MG OF ELEMENTAL CALCIUM) 1250 (500 Ca) MG tablet Take by mouth.  Marland Kitchen guaiFENesin-codeine (ROBITUSSIN AC) 100-10 MG/5ML syrup Take 5-10 mLs by mouth 3 (three) times daily as needed for cough.  . levothyroxine (SYNTHROID) 25 MCG tablet Take 1 tablet by mouth once daily  . Multiple Vitamin (MULTIVITAMIN) capsule Take 1 capsule by mouth daily.  . naproxen sodium (ALEVE) 220 MG tablet Take 220 mg by mouth daily as needed.   Marland Kitchen OVER THE COUNTER  MEDICATION Kyolic aged garlic extract, 347 mg one capsule 3 times daily.  Marland Kitchen OVER THE COUNTER MEDICATION Apple Cider Vineger, 15 ml 3 x a week.  Marland Kitchen OVER THE COUNTER MEDICATION Selenium, 100 mcg one to two a week.  Marland Kitchen OVER THE COUNTER MEDICATION Lutein 6 mg, One tablet two times a day.  . vitamin E 1000 UNIT capsule Take by mouth.  . famotidine (PEPCID) 20 MG tablet Take 1 tablet (20 mg total) by mouth 2 (two) times daily. (Patient not taking: No sig reported)   No facility-administered encounter medications on file as of 10/11/2020.    Allergies (verified) Lidocaine, Mepivacaine, Typhoid vi polysaccharide vaccine, Amlodipine, Doxycycline, Nitrofurantoin, and Sulfa antibiotics   History: Past Medical History:  Diagnosis Date  . Anxiety   . Cataract   . Chronic headaches   . Helicobacter pylori gastritis 2009   EGD  . Hyperlipemia   . Personal history of colonic polyps    Past Surgical History:  Procedure Laterality Date  . COLONOSCOPY  , 06/08/2011   2009: two 3 mm hyperplastic polyps, lipoma, internal hemorrjhoids 2012: 53mm cecal serrated adenoma, lipoma, internal hemorrhoids  . HERNIA REPAIR  05/04  . ROTATOR CUFF REPAIR  05/06  . UPPER GASTROINTESTINAL ENDOSCOPY  02/02/2008   erosive gastropathy   Family History  Problem Relation Age of Onset  . Heart disease Father   . Celiac disease Mother   . Colon cancer Neg Hx   . Esophageal cancer Neg Hx   . Liver cancer Neg Hx   .  Pancreatic cancer Neg Hx   . Rectal cancer Neg Hx   . Stomach cancer Neg Hx   . Allergic rhinitis Neg Hx    Social History   Socioeconomic History  . Marital status: Married    Spouse name: Not on file  . Number of children: 3  . Years of education: Not on file  . Highest education level: Not on file  Occupational History  . Occupation: Retired  Tobacco Use  . Smoking status: Never Smoker  . Smokeless tobacco: Never Used  Vaping Use  . Vaping Use: Never used  Substance and Sexual Activity  .  Alcohol use: Not Currently    Comment: Occasional beer  . Drug use: No  . Sexual activity: Yes    Partners: Female    Birth control/protection: None  Other Topics Concern  . Not on file  Social History Narrative  . Not on file   Social Determinants of Health   Financial Resource Strain: Low Risk   . Difficulty of Paying Living Expenses: Not hard at all  Food Insecurity: No Food Insecurity  . Worried About Charity fundraiser in the Last Year: Never true  . Ran Out of Food in the Last Year: Never true  Transportation Needs: No Transportation Needs  . Lack of Transportation (Medical): No  . Lack of Transportation (Non-Medical): No  Physical Activity: Inactive  . Days of Exercise per Week: 0 days  . Minutes of Exercise per Session: 0 min  Stress: No Stress Concern Present  . Feeling of Stress : Not at all  Social Connections: Not on file    Tobacco Counseling Counseling given: Not Answered   Clinical Intake:  Pre-visit preparation completed: Yes  Pain : No/denies pain     Nutritional Risks: None Diabetes: No  How often do you need to have someone help you when you read instructions, pamphlets, or other written materials from your doctor or pharmacy?: 1 - Never What is the last grade level you completed in school?: Masters  Diabetic: no Nutrition Risk Assessment:  Has the patient had any N/V/D within the last 2 months?  No  Does the patient have any non-healing wounds?  No  Has the patient had any unintentional weight loss or weight gain?  No   Diabetes:  Is the patient diabetic?  No  If diabetic, was a CBG obtained today?  N/A Did the patient bring in their glucometer from home?  N/A How often do you monitor your CBG's? N/A.   Financial Strains and Diabetes Management:  Are you having any financial strains with the device, your supplies or your medication? N/A.  Does the patient want to be seen by Chronic Care Management for management of their diabetes?   N/A Would the patient like to be referred to a Nutritionist or for Diabetic Management?  N/A    Interpreter Needed?: No  Information entered by :: CJohnson, LPN   Activities of Daily Living In your present state of health, do you have any difficulty performing the following activities: 10/11/2020  Hearing? N  Vision? N  Difficulty concentrating or making decisions? N  Walking or climbing stairs? N  Dressing or bathing? N  Doing errands, shopping? N  Preparing Food and eating ? N  Using the Toilet? N  In the past six months, have you accidently leaked urine? N  Do you have problems with loss of bowel control? N  Managing your Medications? N  Managing your Finances? N  Housekeeping or managing your Housekeeping? N  Some recent data might be hidden    Patient Care Team: Emi Belfast, FNP as PCP - General (Nurse Practitioner)  Indicate any recent Medical Services you may have received from other than [facility] providers in the past year (date may be approximate).     Assessment:   This is a routine wellness examination for [facility].  Hearing/Vision screen  Hearing Screening   125Hz  250Hz  500Hz  1000Hz  2000Hz  3000Hz  4000Hz  6000Hz  8000Hz   Right ear:           Left ear:           Vision Screening Comments: Patient gets annual eye exams   Dietary issues and exercise activities discussed: Current Exercise Habits: The patient does not participate in regular exercise at present, Exercise limited by: None identified  Goals    . Patient Stated     10/11/2020, I will maintain and continue medications as prescribed.       Depression Screen PHQ 2/9 Scores 10/11/2020 08/31/2019 06/02/2018 05/02/2018  PHQ - 2 Score 0 0 0 0  PHQ- 9 Score 0 - - -    Fall Risk Fall Risk  10/11/2020  Falls in the past year? 0  Number falls in past yr: 0  Injury with Fall? 0  Risk for fall due to : No Fall Risks  Follow up Falls evaluation completed;Falls prevention discussed    FALL RISK PREVENTION  PERTAINING TO THE HOME:  Any stairs in or around the home? Yes  If so, are there any without handrails? No  Home free of loose throw rugs in walkways, pet beds, electrical cords, etc? Yes  Adequate lighting in your home to reduce risk of falls? Yes   ASSISTIVE DEVICES UTILIZED TO PREVENT FALLS:  Life alert? No  Use of a cane, walker or w/c? No  Grab bars in the bathroom? No  Shower chair or bench in shower? No  Elevated toilet seat or a handicapped toilet? No   TIMED UP AND GO:  Was the test performed? N/A, telephone visit .    Cognitive Function: MMSE - Mini Mental State Exam 10/11/2020  Not completed: Refused       Mini Cog  Mini-Cog screen was completed. Maximum score is 22. A value of 0 denotes this part of the MMSE was not completed or the patient failed this part of the Mini-Cog screening.  Immunizations Immunization History  Administered Date(s) Administered  . Influenza, High Dose Seasonal PF 05/28/2019  . Influenza, Quadrivalent, Recombinant, Inj, Pf 07/24/2018  . Influenza,inj,Quad PF,6+ Mos 09/16/2015, 07/08/2017  . Influenza,inj,quad, With Preservative 09/16/2015  . Influenza-Unspecified 08/01/2016, 05/28/2019  . Janssen (J&J) SARS-COV-2 Vaccination 12/22/2019  . Pneumococcal Conjugate-13 06/02/2019  . Pneumococcal Polysaccharide-23 07/12/2020  . Zoster Recombinat (Shingrix) 12/02/2017, 07/29/2018    TDAP status: Due, Education has been provided regarding the importance of this vaccine. Advised may receive this vaccine at local pharmacy or Health Dept. Aware to provide a copy of the vaccination record if obtained from local pharmacy or Health Dept. Verbalized acceptance and understanding.  Flu Vaccine status: Due, Education has been provided regarding the importance of this vaccine. Advised may receive this vaccine at local pharmacy or Health Dept. Aware to provide a copy of the vaccination record if obtained from local pharmacy or Health Dept. Verbalized  acceptance and understanding.  Pneumococcal vaccine status: Up to date  Covid-19 vaccine status:completed J&J  12/22/2019, booster not completed   Qualifies for Shingles Vaccine? Yes  Zostavax completed No   Shingrix Completed?: Yes  Screening Tests Health Maintenance  Topic Date Due  . COVID-19 Vaccine (2 - Booster for Janssen series) 02/16/2020  . INFLUENZA VACCINE  05/08/2020  . TETANUS/TDAP  10/12/2023 (Originally 02/04/1975)  . COLONOSCOPY (Pts 45-35yrs Insurance coverage will need to be confirmed)  01/18/2028  . Hepatitis C Screening  Completed  . PNA vac Low Risk Adult  Completed    Health Maintenance  Health Maintenance Due  Topic Date Due  . COVID-19 Vaccine (2 - Booster for Janssen series) 02/16/2020  . INFLUENZA VACCINE  05/08/2020    Colorectal cancer screening: Type of screening: Colonoscopy. Completed 01/17/2018. Repeat every 10 years  Lung Cancer Screening: (Low Dose CT Chest recommended if Age 4-80 years, 30 pack-year currently smoking OR have quit w/in 15years.) does not qualify.   Additional Screening:  Hepatitis C Screening: does qualify; Completed 08/31/2019   Vision Screening: Recommended annual ophthalmology exams for early detection of glaucoma and other disorders of the eye. Is the patient up to date with their annual eye exam?  Yes  Who is the provider or what is the name of the office in which the patient attends annual eye exams? Dr. Marvis Repress, Dr. Lottie Dawson If pt is not established with a provider, would they like to be referred to a provider to establish care? No .   Dental Screening: Recommended annual dental exams for proper oral hygiene  Community Resource Referral / Chronic Care Management: CRR required this visit?  No   CCM required this visit?  No      Plan:     I have personally reviewed and noted the following in the patient's chart:   . Medical and social history . Use of alcohol, tobacco or illicit drugs  . Current  medications and supplements . Functional ability and status . Nutritional status . Physical activity . Advanced directives . List of other physicians . Hospitalizations, surgeries, and ER visits in previous 12 months . Vitals . Screenings to include cognitive, depression, and falls . Referrals and appointments  In addition, I have reviewed and discussed with patient certain preventive protocols, quality metrics, and best practice recommendations. A written personalized care plan for preventive services as well as general preventive health recommendations were provided to patient.   Due to this being a telephonic visit, the after visit summary with patients personalized plan was offered to patient via office or my-chart. Patient preferred to pick up at office at next visit or via mychart.   Janalyn Shy, LPN   10/13/1094

## 2020-10-11 NOTE — Patient Instructions (Signed)
Alex Huffman , Thank you for taking time to come for your Medicare Wellness Visit. I appreciate your ongoing commitment to your health goals. Please review the following plan we discussed and let me know if I can assist you in the future.   Screening recommendations/referrals: Colonoscopy: Up to date, completed 01/17/2018, due 01/2028 Recommended yearly ophthalmology/optometry visit for glaucoma screening and checkup Recommended yearly dental visit for hygiene and checkup  Vaccinations: Influenza vaccine: due, will get at physical Pneumococcal vaccine: Completed series Tdap vaccine: decline-insurance Shingles vaccine: Completed series   Covid-19: completed 12/22/2019, booster not completed   Advanced directives: Please bring a copy of your POA (Power of [place]) and/or Living Will to your next appointment.  Conditions/risks identified: none  Next appointment: Follow up in one year for your annual wellness visit.   Preventive Care 45 Years and Older, Male Preventive care refers to lifestyle choices and visits with your health care provider that can promote health and wellness. What does preventive care include?  A yearly physical exam. This is also called an annual well check.  Dental exams once or twice a year.  Routine eye exams. Ask your health care provider how often you should have your eyes checked.  Personal lifestyle choices, including:  Daily care of your teeth and gums.  Regular physical activity.  Eating a healthy diet.  Avoiding tobacco and drug use.  Limiting alcohol use.  Practicing safe sex.  Taking low doses of aspirin every day.  Taking vitamin and mineral supplements as recommended by your health care provider. What happens during an annual well check? The services and screenings done by your health care provider during your annual well check will depend on your age, overall health, lifestyle risk factors, and family history of disease. Counseling   Your health care provider may ask you questions about your:  Alcohol use.  Tobacco use.  Drug use.  Emotional well-being.  Home and relationship well-being.  Sexual activity.  Eating habits.  History of falls.  Memory and ability to understand (cognition).  Work and work Astronomer. Screening  You may have the following tests or measurements:  Height, weight, and BMI.  Blood pressure.  Lipid and cholesterol levels. These may be checked every 5 years, or more frequently if you are over 62 years old.  Skin check.  Lung cancer screening. You may have this screening every year starting at age 60 if you have a 30-pack-year history of smoking and currently smoke or have quit within the past 15 years.  Fecal occult blood test (FOBT) of the stool. You may have this test every year starting at age 33.  Flexible sigmoidoscopy or colonoscopy. You may have a sigmoidoscopy every 5 years or a colonoscopy every 10 years starting at age 12.  Prostate cancer screening. Recommendations will vary depending on your family history and other risks.  Hepatitis C blood test.  Hepatitis B blood test.  Sexually transmitted disease (STD) testing.  Diabetes screening. This is done by checking your blood sugar (glucose) after you have not eaten for a while (fasting). You may have this done every 1-3 years.  Abdominal aortic aneurysm (AAA) screening. You may need this if you are a current or former smoker.  Osteoporosis. You may be screened starting at age 66 if you are at high risk. Talk with your health care provider about your test results, treatment options, and if necessary, the need for more tests. Vaccines  Your health care provider may recommend certain vaccines, such  as:  Influenza vaccine. This is recommended every year.  Tetanus, diphtheria, and acellular pertussis (Tdap, Td) vaccine. You may need a Td booster every 10 years.  Zoster vaccine. You may need this after age  22.  Pneumococcal 13-valent conjugate (PCV13) vaccine. One dose is recommended after age 10.  Pneumococcal polysaccharide (PPSV23) vaccine. One dose is recommended after age 84. Talk to your health care provider about which screenings and vaccines you need and how often you need them. This information is not intended to replace advice given to you by your health care provider. Make sure you discuss any questions you have with your health care provider. Document Released: 10/21/2015 Document Revised: 06/13/2016 Document Reviewed: 07/26/2015 Elsevier Interactive Patient Education  2017 [employer] Prevention in the Home Falls can cause injuries. They can happen to people of all ages. There are many things you can do to make your home safe and to help prevent falls. What can I do on the outside of my home?  Regularly fix the edges of walkways and driveways and fix any cracks.  Remove anything that might make you trip as you walk through a door, such as a raised step or threshold.  Trim any bushes or trees on the path to your home.  Use bright outdoor lighting.  Clear any walking paths of anything that might make someone trip, such as rocks or tools.  Regularly check to see if handrails are loose or broken. Make sure that both sides of any steps have handrails.  Any raised decks and porches should have guardrails on the edges.  Have any leaves, snow, or ice cleared regularly.  Use sand or salt on walking paths during winter.  Clean up any spills in your garage right away. This includes oil or grease spills. What can I do in the bathroom?  Use night lights.  Install grab bars by the toilet and in the tub and shower. Do not use towel bars as grab bars.  Use non-skid mats or decals in the tub or shower.  If you need to sit down in the shower, use a plastic, non-slip stool.  Keep the floor dry. Clean up any water that spills on the floor as soon as it happens.  Remove  soap buildup in the tub or shower regularly.  Attach bath mats securely with double-sided non-slip rug tape.  Do not have throw rugs and other things on the floor that can make you trip. What can I do in the bedroom?  Use night lights.  Make sure that you have a light by your bed that is easy to reach.  Do not use any sheets or blankets that are too big for your bed. They should not hang down onto the floor.  Have a firm chair that has side arms. You can use this for support while you get dressed.  Do not have throw rugs and other things on the floor that can make you trip. What can I do in the kitchen?  Clean up any spills right away.  Avoid walking on wet floors.  Keep items that you use a lot in easy-to-reach places.  If you need to reach something above you, use a strong step stool that has a grab bar.  Keep electrical cords out of the way.  Do not use floor polish or wax that makes floors slippery. If you must use wax, use non-skid floor wax.  Do not have throw rugs and other things on the  floor that can make you trip. What can I do with my stairs?  Do not leave any items on the stairs.  Make sure that there are handrails on both sides of the stairs and use them. Fix handrails that are broken or loose. Make sure that handrails are as long as the stairways.  Check any carpeting to make sure that it is firmly attached to the stairs. Fix any carpet that is loose or worn.  Avoid having throw rugs at the top or bottom of the stairs. If you do have throw rugs, attach them to the floor with carpet tape.  Make sure that you have a light switch at the top of the stairs and the bottom of the stairs. If you do not have them, ask someone to add them for you. What else can I do to help prevent falls?  Wear shoes that:  Do not have high heels.  Have rubber bottoms.  Are comfortable and fit you well.  Are closed at the toe. Do not wear sandals.  If you use a  stepladder:  Make sure that it is fully opened. Do not climb a closed stepladder.  Make sure that both sides of the stepladder are locked into place.  Ask someone to hold it for you, if possible.  Clearly mark and make sure that you can see:  Any grab bars or handrails.  First and last steps.  Where the edge of each step is.  Use tools that help you move around (mobility aids) if they are needed. These include:  Canes.  Walkers.  Scooters.  Crutches.  Turn on the lights when you go into a dark area. Replace any light bulbs as soon as they burn out.  Set up your furniture so you have a clear path. Avoid moving your furniture around.  If any of your floors are uneven, fix them.  If there are any pets around you, be aware of where they are.  Review your medicines with your doctor. Some medicines can make you feel dizzy. This can increase your chance of falling. Ask your doctor what other things that you can do to help prevent falls. This information is not intended to replace advice given to you by your health care provider. Make sure you discuss any questions you have with your health care provider. Document Released: 07/21/2009 Document Revised: 03/01/2016 Document Reviewed: 10/29/2014 Elsevier Interactive Patient Education  2017 [employer].

## 2020-10-18 ENCOUNTER — Emergency Department (HOSPITAL_COMMUNITY): Payer: Medicare Other

## 2020-10-18 ENCOUNTER — Inpatient Hospital Stay (HOSPITAL_COMMUNITY)
Admission: EM | Admit: 2020-10-18 | Discharge: 2020-10-21 | DRG: 308 | Disposition: A | Discharge status: Home / Self Care | Payer: Medicare Other | Attending: Internal Medicine | Admitting: Internal Medicine

## 2020-10-18 ENCOUNTER — Other Ambulatory Visit: Payer: Self-pay

## 2020-10-18 ENCOUNTER — Encounter (HOSPITAL_COMMUNITY): Payer: Self-pay

## 2020-10-18 DIAGNOSIS — Z882 Allergy status to sulfonamides status: Secondary | ICD-10-CM

## 2020-10-18 DIAGNOSIS — E860 Dehydration: Secondary | ICD-10-CM | POA: Diagnosis present

## 2020-10-18 DIAGNOSIS — R519 Headache, unspecified: Secondary | ICD-10-CM | POA: Diagnosis present

## 2020-10-18 DIAGNOSIS — Z884 Allergy status to anesthetic agent status: Secondary | ICD-10-CM

## 2020-10-18 DIAGNOSIS — R002 Palpitations: Secondary | ICD-10-CM | POA: Diagnosis present

## 2020-10-18 DIAGNOSIS — Z79899 Other long term (current) drug therapy: Secondary | ICD-10-CM | POA: Diagnosis not present

## 2020-10-18 DIAGNOSIS — I5033 Acute on chronic diastolic (congestive) heart failure: Secondary | ICD-10-CM | POA: Diagnosis present

## 2020-10-18 DIAGNOSIS — I110 Hypertensive heart disease with heart failure: Secondary | ICD-10-CM | POA: Diagnosis present

## 2020-10-18 DIAGNOSIS — Z8673 Personal history of transient ischemic attack (TIA), and cerebral infarction without residual deficits: Secondary | ICD-10-CM | POA: Diagnosis not present

## 2020-10-18 DIAGNOSIS — I480 Paroxysmal atrial fibrillation: Secondary | ICD-10-CM | POA: Diagnosis not present

## 2020-10-18 DIAGNOSIS — Z86010 Personal history of colonic polyps: Secondary | ICD-10-CM | POA: Diagnosis not present

## 2020-10-18 DIAGNOSIS — I959 Hypotension, unspecified: Secondary | ICD-10-CM | POA: Diagnosis present

## 2020-10-18 DIAGNOSIS — I4891 Unspecified atrial fibrillation: Secondary | ICD-10-CM

## 2020-10-18 DIAGNOSIS — F419 Anxiety disorder, unspecified: Secondary | ICD-10-CM | POA: Diagnosis present

## 2020-10-18 DIAGNOSIS — E785 Hyperlipidemia, unspecified: Secondary | ICD-10-CM | POA: Diagnosis present

## 2020-10-18 DIAGNOSIS — N179 Acute kidney failure, unspecified: Secondary | ICD-10-CM | POA: Diagnosis present

## 2020-10-18 DIAGNOSIS — I10 Essential (primary) hypertension: Secondary | ICD-10-CM | POA: Diagnosis not present

## 2020-10-18 DIAGNOSIS — U071 COVID-19: Secondary | ICD-10-CM

## 2020-10-18 DIAGNOSIS — Z8249 Family history of ischemic heart disease and other diseases of the circulatory system: Secondary | ICD-10-CM | POA: Diagnosis not present

## 2020-10-18 DIAGNOSIS — I493 Ventricular premature depolarization: Secondary | ICD-10-CM | POA: Diagnosis present

## 2020-10-18 DIAGNOSIS — R0602 Shortness of breath: Secondary | ICD-10-CM | POA: Diagnosis not present

## 2020-10-18 DIAGNOSIS — Z881 Allergy status to other antibiotic agents status: Secondary | ICD-10-CM

## 2020-10-18 DIAGNOSIS — R059 Cough, unspecified: Secondary | ICD-10-CM

## 2020-10-18 DIAGNOSIS — Z79890 Hormone replacement therapy (postmenopausal): Secondary | ICD-10-CM | POA: Diagnosis not present

## 2020-10-18 DIAGNOSIS — E039 Hypothyroidism, unspecified: Secondary | ICD-10-CM | POA: Diagnosis present

## 2020-10-18 DIAGNOSIS — R0789 Other chest pain: Secondary | ICD-10-CM | POA: Diagnosis present

## 2020-10-18 DIAGNOSIS — Z888 Allergy status to other drugs, medicaments and biological substances status: Secondary | ICD-10-CM | POA: Diagnosis not present

## 2020-10-18 LAB — D-DIMER, QUANTITATIVE: D-Dimer, Quant: 0.54 ug/mL-FEU — ABNORMAL HIGH (ref 0.00–0.50)

## 2020-10-18 LAB — BASIC METABOLIC PANEL
Anion gap: 12 (ref 5–15)
BUN: 24 mg/dL — ABNORMAL HIGH (ref 8–23)
CO2: 22 mmol/L (ref 22–32)
Calcium: 9.4 mg/dL (ref 8.9–10.3)
Chloride: 104 mmol/L (ref 98–111)
Creatinine, Ser: 1.24 mg/dL (ref 0.61–1.24)
GFR, Estimated: >60 mL/min (ref >60)
Glucose, Bld: 129 mg/dL — ABNORMAL HIGH (ref 70–99)
Potassium: 4.1 mmol/L (ref 3.5–5.1)
Sodium: 138 mmol/L (ref 135–145)

## 2020-10-18 LAB — URINALYSIS, ROUTINE W REFLEX MICROSCOPIC
Bilirubin Urine: NEGATIVE
Glucose, UA: NEGATIVE mg/dL
Hgb urine dipstick: NEGATIVE
Ketones, ur: NEGATIVE mg/dL
Leukocytes,Ua: NEGATIVE
Nitrite: NEGATIVE
Protein, ur: NEGATIVE mg/dL
Specific Gravity, Urine: 1.018 (ref 1.005–1.030)
pH: 6 (ref 5.0–8.0)

## 2020-10-18 LAB — TYPE AND SCREEN
ABO/RH(D): A POS
Antibody Screen: NEGATIVE

## 2020-10-18 LAB — FERRITIN: Ferritin: 218 ng/mL (ref 24–336)

## 2020-10-18 LAB — CBC
HCT: 50.8 % (ref 39.0–52.0)
Hemoglobin: 16.2 g/dL (ref 13.0–17.0)
MCH: 28.7 pg (ref 26.0–34.0)
MCHC: 31.9 g/dL (ref 30.0–36.0)
MCV: 89.9 fL (ref 80.0–100.0)
Platelets: 219 10*3/uL (ref 150–400)
RBC: 5.65 MIL/uL (ref 4.22–5.81)
RDW: 12 % (ref 11.5–15.5)
WBC: 6.1 10*3/uL (ref 4.0–10.5)
nRBC: 0 % (ref 0.0–0.2)

## 2020-10-18 LAB — ABO/RH: ABO/RH(D): A POS

## 2020-10-18 LAB — PROCALCITONIN: Procalcitonin: <0.1 ng/mL

## 2020-10-18 LAB — HEPATITIS B SURFACE ANTIGEN: Hepatitis B Surface Ag: NONREACTIVE

## 2020-10-18 LAB — FIBRINOGEN: Fibrinogen: 415 mg/dL (ref 210–475)

## 2020-10-18 LAB — HEMOGLOBIN A1C
Hgb A1c MFr Bld: 6 % — ABNORMAL HIGH (ref 4.8–5.6)
Mean Plasma Glucose: 125.5 mg/dL

## 2020-10-18 LAB — TSH
TSH: 5.431 u[IU]/mL — ABNORMAL HIGH (ref 0.350–4.500)
TSH: 3.387 u[IU]/mL (ref 0.350–4.500)

## 2020-10-18 LAB — RESP PANEL BY RT-PCR (FLU A&B, COVID) ARPGX2
Influenza A by PCR: NEGATIVE
Influenza B by PCR: NEGATIVE
SARS Coronavirus 2 by RT PCR: POSITIVE — AB

## 2020-10-18 LAB — T4, FREE: Free T4: 0.88 ng/dL (ref 0.61–1.12)

## 2020-10-18 LAB — HIV ANTIBODY (ROUTINE TESTING W REFLEX): HIV Screen 4th Generation wRfx: NONREACTIVE

## 2020-10-18 LAB — TROPONIN I (HIGH SENSITIVITY)
Troponin I (High Sensitivity): 8 ng/L (ref <18)
Troponin I (High Sensitivity): 8 ng/L (ref <18)
Troponin I (High Sensitivity): 9 ng/L (ref <18)

## 2020-10-18 LAB — BRAIN NATRIURETIC PEPTIDE: B Natriuretic Peptide: 268.3 pg/mL — ABNORMAL HIGH (ref 0.0–100.0)

## 2020-10-18 LAB — LACTATE DEHYDROGENASE: LDH: 160 U/L (ref 98–192)

## 2020-10-18 LAB — C-REACTIVE PROTEIN: CRP: <0.5 mg/dL (ref <1.0)

## 2020-10-18 MED ORDER — LEVOTHYROXINE SODIUM 25 MCG PO TABS
25.0000 ug | ORAL_TABLET | Freq: Every day | ORAL | Status: DC
  Administered 2020-10-19 – 2020-10-21 (×3): 25 ug via ORAL
  Filled 2020-10-18 (×3): qty 1

## 2020-10-18 MED ORDER — METOPROLOL TARTRATE 25 MG PO TABS
50.0000 mg | ORAL_TABLET | Freq: Two times a day (BID) | ORAL | Status: DC
  Administered 2020-10-18: 50 mg via ORAL
  Filled 2020-10-18 (×2): qty 2

## 2020-10-18 MED ORDER — BENZONATATE 100 MG PO CAPS: 100.0000 mg | ORAL_CAPSULE | Freq: Three times a day (TID) | ORAL | Status: DC | PRN

## 2020-10-18 MED ORDER — GUAIFENESIN-CODEINE 100-10 MG/5ML PO SOLN: 5.0000 mL | Freq: Three times a day (TID) | ORAL | Status: DC | PRN

## 2020-10-18 MED ORDER — APIXABAN 5 MG PO TABS
5.0000 mg | ORAL_TABLET | Freq: Two times a day (BID) | ORAL | Status: DC
  Administered 2020-10-18 – 2020-10-21 (×6): 5 mg via ORAL
  Filled 2020-10-18 (×6): qty 1

## 2020-10-18 MED ORDER — FAMOTIDINE 20 MG PO TABS
20.0000 mg | ORAL_TABLET | Freq: Two times a day (BID) | ORAL | Status: DC
  Administered 2020-10-18: 20 mg via ORAL
  Filled 2020-10-18 (×6): qty 1

## 2020-10-18 MED ORDER — ACETAMINOPHEN 325 MG PO TABS
650.0000 mg | ORAL_TABLET | ORAL | Status: DC | PRN
  Administered 2020-10-18: 650 mg via ORAL
  Filled 2020-10-18: qty 2

## 2020-10-18 MED ORDER — ASCORBIC ACID 500 MG PO TABS
500.0000 mg | ORAL_TABLET | Freq: Every day | ORAL | Status: DC
  Administered 2020-10-18 – 2020-10-21 (×4): 500 mg via ORAL
  Filled 2020-10-18 (×4): qty 1

## 2020-10-18 MED ORDER — DILTIAZEM LOAD VIA INFUSION
20.0000 mg | Freq: Once | INTRAVENOUS | Status: AC
  Administered 2020-10-18: 20 mg via INTRAVENOUS
  Filled 2020-10-18: qty 20

## 2020-10-18 MED ORDER — VITAMIN D 25 MCG (1000 UNIT) PO TABS
1000.0000 [IU] | ORAL_TABLET | Freq: Every day | ORAL | Status: DC
  Administered 2020-10-18: 1000 [IU] via ORAL
  Filled 2020-10-18: qty 1

## 2020-10-18 MED ORDER — ADULT MULTIVITAMIN W/MINERALS CH
1.0000 | ORAL_TABLET | Freq: Every day | ORAL | Status: DC
  Administered 2020-10-18 – 2020-10-21 (×4): 1 via ORAL
  Filled 2020-10-18 (×4): qty 1

## 2020-10-18 MED ORDER — ONDANSETRON HCL 4 MG/2ML IJ SOLN: 4.0000 mg | Freq: Four times a day (QID) | INTRAMUSCULAR | Status: DC | PRN

## 2020-10-18 MED ORDER — VITAMIN E 45 MG (100 UNIT) PO CAPS
100.0000 [IU] | ORAL_CAPSULE | Freq: Every day | ORAL | Status: DC
  Administered 2020-10-18 – 2020-10-21 (×3): 100 [IU] via ORAL
  Filled 2020-10-18 (×4): qty 1

## 2020-10-18 MED ORDER — DILTIAZEM HCL-DEXTROSE 125-5 MG/125ML-% IV SOLN (PREMIX)
5.0000 mg/h | INTRAVENOUS | Status: DC
  Administered 2020-10-18: 5 mg/h via INTRAVENOUS
  Filled 2020-10-18: qty 125

## 2020-10-18 MED ORDER — LUTEIN 6 MG PO TABS: 6.0000 mg | ORAL_TABLET | Freq: Every day | ORAL | Status: DC

## 2020-10-18 MED ORDER — CALCIUM CARBONATE 1250 (500 CA) MG PO TABS
1.0000 | ORAL_TABLET | Freq: Every day | ORAL | Status: DC
  Administered 2020-10-20 – 2020-10-21 (×2): 500 mg via ORAL
  Filled 2020-10-18 (×3): qty 1

## 2020-10-18 NOTE — ED Provider Notes (Signed)
  Physical Exam  BP (!) 133/92   Pulse 92   Temp 97.9 F (36.6 C) (Oral)   Resp 16   Ht 5\' 7"  (1.702 m)   Wt 81.2 kg   SpO2 99%   BMI 28.04 kg/m   Physical Exam  ED Course/Procedures     Procedures  MDM  Patient care assumed at 3 PM.  Patient is fully vaccinated but was had a family gathering on January 1 and caught COVID.  He has some fevers and chills and shortness of breath.  Patient also has mild palpitations.  Patient was noted to have some left-sided weakness.  He states that the weakness been going on for about a week.  Patient is placed on Cardizem drip.  Signout pending CT head and admission  4:52 PM CT head unremarkable. Patient doesn't require oxygen for COVID. Patient is on cardizem drip. Will admit for new onset afib with RVR.   CRITICAL CARE Performed by: Wandra Arthurs   Total critical care time: 30  minutes  Critical care time was exclusive of separately billable procedures and treating other patients.  Critical care was necessary to treat or prevent imminent or life-threatening deterioration.  Critical care was time spent personally by me on the following activities: development of treatment plan with patient and/or surrogate as well as nursing, discussions with consultants, evaluation of patient's response to treatment, examination of patient, obtaining history from patient or surrogate, ordering and performing treatments and interventions, ordering and review of laboratory studies, ordering and review of radiographic studies, pulse oximetry and re-evaluation of patient's condition.       Drenda Freeze, MD 10/18/20 [phone]

## 2020-10-18 NOTE — ED Provider Notes (Signed)
[facility] Provider Note   CSN: [id number] Arrival date & time: 10/18/20  1303     History Chief Complaint  Patient presents with  . Dizziness  . Irregular Heart Beat    Alex Huffman is a 65 y.o. male.  65 year old male presents with increased dyspnea exertion as well as weakness and palpitations.  States has had this for quite some time but has gotten worse over the past several days.  Has had near syncope.  Is also been experiencing intermittent chest pressure.  Has a history of hypothyroidism and denies any history of A. fib.  Does not use any alcohol daily.  Patient notes several days of left upper as well as left lower extremity weakness.  Denies any gait disturbance.        Past Medical History:  Diagnosis Date  . Anxiety   . Cataract   . Chronic headaches   . Helicobacter pylori gastritis 2009   EGD  . Hyperlipemia   . Personal history of colonic polyps     Patient Active Problem List   Diagnosis Date Noted  . Combined forms of age-related cataract of both eyes 12/06/2016  . Bradycardia 05/09/2016  . Retinal tear of left eye 07/07/2015  . Herniated lumbar disc without myelopathy 10/21/2014  . Benign localized prostatic hyperplasia with lower urinary tract symptoms (LUTS) 10/15/2012  . Chronic prostatitis 10/15/2012  . ED (erectile dysfunction) of organic origin 10/15/2012  . Elevated prostate specific antigen (PSA) 10/15/2012  . Incomplete emptying of bladder 10/15/2012  . Personal history of colonic polyps 06/15/2011  . Hemorrhoids, internal, with bleeding 05/28/2011  . Change in bowel habits, narrow caliber stools 05/28/2011  . RUQ pain 05/28/2011    Past Surgical History:  Procedure Laterality Date  . COLONOSCOPY  , 06/08/2011   2009: two 3 mm hyperplastic polyps, lipoma, internal hemorrjhoids 2012: 20mm cecal serrated adenoma, lipoma, internal hemorrhoids  . HERNIA REPAIR  05/04  . ROTATOR CUFF REPAIR   05/06  . UPPER GASTROINTESTINAL ENDOSCOPY  02/02/2008   erosive gastropathy       Family History  Problem Relation Age of Onset  . Heart disease Father   . Celiac disease Mother   . Colon cancer Neg Hx   . Esophageal cancer Neg Hx   . Liver cancer Neg Hx   . Pancreatic cancer Neg Hx   . Rectal cancer Neg Hx   . Stomach cancer Neg Hx   . Allergic rhinitis Neg Hx     Social History   Tobacco Use  . Smoking status: Never Smoker  . Smokeless tobacco: Never Used  Vaping Use  . Vaping Use: Never used  Substance Use Topics  . Alcohol use: Not Currently    Comment: Occasional beer  . Drug use: No    Home Medications Prior to Admission medications   Medication Sig Start Date End Date Taking? Authorizing Provider  aspirin 325 MG EC tablet Take 325 mg by mouth as needed for pain.    [provider]  benzonatate (TESSALON) 100 MG capsule Take 1-2 capsules (100-200 mg total) by mouth 3 (three) times daily as needed. Patient taking differently: Take 100-200 mg by mouth 3 (three) times daily as needed for cough. 09/07/20   Elby Beck, FNP  calcium carbonate (OS-CAL - DOSED IN MG OF ELEMENTAL CALCIUM) 1250 (500 Ca) MG tablet Take 1 tablet by mouth daily with breakfast.    [provider]  famotidine (PEPCID)  20 MG tablet Take 1 tablet (20 mg total) by mouth 2 (two) times daily. Patient not taking: No sig reported 08/31/19   Elby Beck, FNP  guaiFENesin-codeine [facility]) 100-10 MG/5ML syrup Take 5-10 mLs by mouth 3 (three) times daily as needed for cough. 09/09/20   Elby Beck, FNP  levothyroxine (SYNTHROID) 25 MCG tablet Take 1 tablet by mouth once daily Patient taking differently: Take 25 mcg by mouth daily before breakfast. 08/12/20   Tonia Ghent, MD  Multiple Vitamin (MULTIVITAMIN) capsule Take 1 capsule by mouth daily.    [provider]  naproxen sodium (ALEVE) 220 MG tablet Take 220 mg by mouth daily as needed (pain).     [provider]  OVER THE COUNTER MEDICATION Take 300 mg by mouth in the morning, at noon, and at bedtime. Kyolic aged garlic extract    [provider]  OVER THE COUNTER MEDICATION Take 15 mLs by mouth every Monday, Wednesday, and Friday. Engineer, technical sales, Historical, MD  OVER THE COUNTER MEDICATION Take 100 mcg by mouth See admin instructions. Selenium: 100 mcg one to two a week.    [provider]  OVER THE COUNTER MEDICATION Take 6 mg by mouth in the morning and at bedtime. Lutein    [provider]  vitamin E 1000 UNIT capsule Take 1,000 Units by mouth daily. 10/15/12   [provider]    Allergies    Lidocaine, Mepivacaine, Typhoid vi polysaccharide vaccine, Amlodipine, Doxycycline, Nitrofurantoin, and Sulfa antibiotics  Review of Systems   Review of Systems  All other systems reviewed and are negative.   Physical Exam Updated Vital Signs BP 127/79   Pulse 81   Temp 97.9 F (36.6 C) (Oral)   Resp 18   Ht 1.702 m (5\' 7" )   Wt 81.2 kg   SpO2 100%   BMI 28.04 kg/m   Physical Exam Vitals and nursing note reviewed.  Constitutional:      General: He is not in acute distress.    Appearance: Normal appearance. He is well-developed and well-nourished. He is not toxic-appearing.  HENT:     Head: Normocephalic and atraumatic.  Eyes:     General: Lids are normal.     Extraocular Movements: EOM normal.     Conjunctiva/sclera: Conjunctivae normal.     Pupils: Pupils are equal, round, and reactive to light.  Neck:     Thyroid: No thyroid mass.     Trachea: No tracheal deviation.  Cardiovascular:     Rate and Rhythm: Tachycardia present. Rhythm irregular.     Heart sounds: Normal heart sounds. No murmur heard. No gallop.   Pulmonary:     Effort: Pulmonary effort is normal. No respiratory distress.     Breath sounds: Normal breath sounds. No stridor. No decreased breath sounds, wheezing, rhonchi or rales.  Abdominal:      General: Bowel sounds are normal. There is no distension.     Palpations: Abdomen is soft.     Tenderness: There is no abdominal tenderness. There is no CVA tenderness or rebound.  Musculoskeletal:        General: No tenderness or edema. Normal range of motion.     Cervical back: Normal range of motion and neck supple.  Skin:    General: Skin is warm and dry.     Findings: No abrasion or rash.  Neurological:     Mental Status: He is alert and oriented to person,  place, and time.     GCS: GCS eye subscore is 4. GCS verbal subscore is 5. GCS motor subscore is 6.     Cranial Nerves: Cranial nerves are intact. No cranial nerve deficit.     Sensory: No sensory deficit.     Motor: Weakness present.     Coordination: Finger-Nose-Finger Test normal.     Deep Tendon Reflexes: Strength normal.     Comments: Patient with 4-5 strength in the left upper and left lower extremity.  Slight pronator drift on the left side.  Psychiatric:        Mood and Affect: Mood and affect normal.        Speech: Speech normal.        Behavior: Behavior normal.     ED Results / Procedures / Treatments   Labs (all labs ordered are listed, but only abnormal results are displayed) Labs Reviewed  BASIC METABOLIC PANEL - Abnormal; Notable for the following components:      Result Value   Glucose, Bld 129 (*)    BUN 24 (*)    All other components within normal limits  RESP PANEL BY RT-PCR (FLU A&B, COVID) [id number]  CBC  URINALYSIS, ROUTINE W REFLEX MICROSCOPIC  TROPONIN I (HIGH SENSITIVITY)    EKG EKG Interpretation  Date/Time:  Tuesday October 18 2020 13:12:41 EST Ventricular Rate:  166 PR Interval:    QRS Duration: 124 QT Interval:  274 QTC Calculation: 455 R Axis:   57 Text Interpretation: Atrial fibrillation with rapid ventricular response Right bundle branch block Abnormal ECG Confirmed by Lacretia Leigh ([id number]) on 10/18/2020 1:59:48 PM   Radiology DG Chest Port 1 View  Result Date:  10/18/2020 CLINICAL DATA:  65 year old male with increasing shortness of breath. EXAM: PORTABLE CHEST 1 VIEW COMPARISON:  None. FINDINGS: The heart size and mediastinal contours are within normal limits. Both lungs are clear. The visualized skeletal structures are unremarkable. IMPRESSION: No acute cardiopulmonary process. Electronically Signed   By: Ruthann Cancer MD   On: 10/18/2020 13:45    Procedures Procedures (including critical care time)  Medications Ordered in ED Medications  diltiazem (CARDIZEM) 1 mg/mL load via infusion 20 mg (has no administration in time range)    And  diltiazem (CARDIZEM) 125 mg in dextrose 5% 125 mL (1 mg/mL) infusion (has no administration in time range)    ED Course  I have reviewed the triage vital signs and the nursing notes.  Pertinent labs & imaging results that were available during my care of the patient were reviewed by me and considered in my medical decision making (see chart for details).    MDM Rules/Calculators/A&P                          Patient here with A. fib with RVR and was given Cardizem bolus and started on drip.  Heart rate has improved.   positive he did endorse some chest discomfort and troponin was negative.  Also endorses some left-sided weakness and will obtain head CT.  Patient require admission and has been sent to Dr. Darl Householder   CRITICAL CARE Performed by: Leota Jacobsen Total critical care time: 50 minutes Critical care time was exclusive of separately billable procedures and treating other patients. Critical care was necessary to treat or prevent imminent or life-threatening deterioration. Critical care was time spent personally by me on the following activities: development of treatment plan with patient and/or surrogate as well as  nursing, discussions with consultants, evaluation of patient's response to treatment, examination of patient, obtaining history from patient or surrogate, ordering and performing treatments and  interventions, ordering and review of laboratory studies, ordering and review of radiographic studies, pulse oximetry and re-evaluation of patient's condition. Final Clinical Impression(s) / ED Diagnoses Final diagnoses:  COVID  Cough    Rx / DC Orders ED Discharge Orders    None       Lacretia Leigh, MD 10/18/20 1526

## 2020-10-18 NOTE — ED Notes (Signed)
RN notified about vitals 

## 2020-10-18 NOTE — H&P (Signed)
History and Physical    Alex Huffman [id number] DOB: 12-28-1955 DOA: 10/18/2020  PCP: Jinny Sanders, MD (Confirm with patient/family/NH records and if not entered, this has to be entered at [facility] point of entry) Patient coming from: Home  I have personally briefly reviewed patient's old medical records in [place]  Chief Complaint: Palpitation shortness of breath  HPI: Alex Huffman is a 65 y.o. male with medical history significant of hypertension not on medications, Question of TIA, HLD, retinal detachment, hypothyroidism, presented with new onset of palpitations and shortness of breath.  Symptoms started 2 to 3 days ago, episodes of palpitations and shortness of breath, initially shortness of breath only happens with activity but last night patient found only short distance walk from bathroom to bedroom because of significant shortness of breath.  No chest pain, no cough no fever chills.  Patient received J&J COVID vaccination x1 in summer 2021. He went to family gather-together on New Year's Day and several people became sick afterwards.   ED Course: Patient was found to be in rapid A. fib, COVID test positive.  Review of Systems: As per HPI otherwise 14 point review of systems negative.    Past Medical History:  Diagnosis Date  . Anxiety   . Cataract   . Chronic headaches   . Helicobacter pylori gastritis 2009   EGD  . Hyperlipemia   . Personal history of colonic polyps     Past Surgical History:  Procedure Laterality Date  . COLONOSCOPY  , 06/08/2011   2009: two 3 mm hyperplastic polyps, lipoma, internal hemorrjhoids 2012: 83mm cecal serrated adenoma, lipoma, internal hemorrhoids  . HERNIA REPAIR  05/04  . ROTATOR CUFF REPAIR  05/06  . UPPER GASTROINTESTINAL ENDOSCOPY  02/02/2008   erosive gastropathy     reports that he has never smoked. He has never used smokeless tobacco. He reports previous alcohol use. He reports that he does not use  drugs.  Allergies  Allergen Reactions  . Lidocaine Hypertension  . Mepivacaine Hypertension  . Typhoid Vi Polysaccharide Vaccine Anaphylaxis and Other (See Comments)    Comatose, almost died   . Amlodipine Other (See Comments)    Leg pain and fatigue  . Doxycycline Other (See Comments)    Unknown reaction  . Nitrofurantoin Other (See Comments) and Nausea And Vomiting  . Sulfa Antibiotics Hives    Family History  Problem Relation Age of Onset  . Heart disease Father   . Celiac disease Mother   . Colon cancer Neg Hx   . Esophageal cancer Neg Hx   . Liver cancer Neg Hx   . Pancreatic cancer Neg Hx   . Rectal cancer Neg Hx   . Stomach cancer Neg Hx   . Allergic rhinitis Neg Hx      Prior to Admission medications   Medication Sig Start Date End Date Taking? Authorizing Provider  Ascorbic Acid (VITAMIN C WITH ROSE HIPS) 500 MG tablet Take 500 mg by mouth daily.   Yes [provider]  aspirin 325 MG EC tablet Take 325 mg by mouth daily as needed for pain.   Yes [provider]  benzonatate (TESSALON) 100 MG capsule Take 1-2 capsules (100-200 mg total) by mouth 3 (three) times daily as needed. Patient taking differently: Take 100-200 mg by mouth 3 (three) times daily as needed for cough. 09/07/20  Yes Elby Beck, FNP  calcium carbonate (OS-CAL - DOSED IN MG OF ELEMENTAL CALCIUM) 1250 (500 Ca)  MG tablet Take 1 tablet by mouth daily with breakfast.   Yes [provider]  cholecalciferol (VITAMIN D3) 25 MCG (1000 UNIT) tablet Take 1,000 Units by mouth daily.   Yes [provider]  guaiFENesin-codeine (ROBITUSSIN AC) 100-10 MG/5ML syrup Take 5-10 mLs by mouth 3 (three) times daily as needed for cough. 09/09/20  Yes Elby Beck, FNP  levothyroxine (SYNTHROID) 25 MCG tablet Take 1 tablet by mouth once daily Patient taking differently: Take 25 mcg by mouth daily before breakfast. 08/12/20  Yes Tonia Ghent, MD  Lutein 6 MG TABS Take 6 mg  by mouth daily at 12 noon.   Yes [provider]  Multiple Vitamin (MULTIVITAMIN) capsule Take 1 capsule by mouth daily.   Yes [provider]  naproxen sodium (ALEVE) 220 MG tablet Take 220 mg by mouth daily as needed (pain).   Yes [provider]  OVER THE COUNTER MEDICATION Take 300 mg by mouth in the morning, at noon, and at bedtime. Kyolic aged garlic extract   Yes [provider]  VITAMIN E PO Take 1 capsule by mouth daily. 10/15/12  Yes [provider]  famotidine (PEPCID) 20 MG tablet Take 1 tablet (20 mg total) by mouth 2 (two) times daily. 08/31/19   Elby Beck, FNP    Physical Exam: Vitals:   10/18/20 1745 10/18/20 1815 10/18/20 1830 10/18/20 1900  BP: (!) 115/94   (!) 127/101  Pulse: 93 88 80 97  Resp: (!) 21 16 15 20   Temp:      TempSrc:      SpO2: 97% 98% 96% 96%  Weight:      Height:        Constitutional: NAD, calm, comfortable Vitals:   10/18/20 1745 10/18/20 1815 10/18/20 1830 10/18/20 1900  BP: (!) 115/94   (!) 127/101  Pulse: 93 88 80 97  Resp: (!) 21 16 15 20   Temp:      TempSrc:      SpO2: 97% 98% 96% 96%  Weight:      Height:       Eyes: PERRL, lids and conjunctivae normal ENMT: Mucous membranes are moist. Posterior pharynx clear of any exudate or lesions.Normal dentition.  Neck: normal, supple, no masses, no thyromegaly Respiratory: clear to auscultation bilaterally, no wheezing, no crackles. Normal respiratory effort. No accessory muscle use.  Cardiovascular: Irregular heart rate, no murmurs / rubs / gallops. No extremity edema. 2+ pedal pulses. No carotid bruits.  Abdomen: no tenderness, no masses palpated. No hepatosplenomegaly. Bowel sounds positive.  Musculoskeletal: no clubbing / cyanosis. No joint deformity upper and lower extremities. Good ROM, no contractures. Normal muscle tone.  Skin: no rashes, lesions, ulcers. No induration Neurologic: CN 2-12 grossly intact. Sensation intact, DTR normal.  Strength 5/5 in all 4.  Psychiatric: Normal judgment and insight. Alert and oriented x 3. Normal mood.     Labs on Admission: I have personally reviewed following labs and imaging studies  CBC: Recent Labs  Lab 10/18/20 1313  WBC 6.1  HGB 16.2  HCT 50.8  MCV 89.9  PLT 427   Basic Metabolic Panel: Recent Labs  Lab 10/18/20 1313  NA 138  K 4.1  CL 104  CO2 22  GLUCOSE 129*  BUN 24*  CREATININE 1.24  CALCIUM 9.4   GFR: Estimated Creatinine Clearance: 59.8 mL/min (by C-G formula based on SCr of 1.24 mg/dL). Liver Function Tests: No results for input(s): AST, ALT, ALKPHOS, BILITOT, PROT, ALBUMIN in the  last 168 hours. No results for input(s): LIPASE, AMYLASE in the last 168 hours. No results for input(s): AMMONIA in the last 168 hours. Coagulation Profile: No results for input(s): INR, PROTIME in the last 168 hours. Cardiac Enzymes: No results for input(s): CKTOTAL, CKMB, CKMBINDEX, TROPONINI in the last 168 hours. BNP (last 3 results) No results for input(s): PROBNP in the last 8760 hours. HbA1C: No results for input(s): HGBA1C in the last 72 hours. CBG: No results for input(s): GLUCAP in the last 168 hours. Lipid Profile: No results for input(s): CHOL, HDL, LDLCALC, TRIG, CHOLHDL, LDLDIRECT in the last 72 hours. Thyroid Function Tests: Recent Labs    10/18/20 1551  TSH 5.431*   Anemia Panel: No results for input(s): VITAMINB12, FOLATE, FERRITIN, TIBC, IRON, RETICCTPCT in the last 72 hours. Urine analysis:    Component Value Date/Time   COLORURINE YELLOW 10/18/2020 [place] 10/18/2020 1645   APPEARANCEUR Clear 07/29/2018 1538   LABSPEC 1.018 10/18/2020 1645   PHURINE 6.0 10/18/2020 1645   GLUCOSEU NEGATIVE 10/18/2020 1645   HGBUR NEGATIVE 10/18/2020 1645   BILIRUBINUR NEGATIVE 10/18/2020 1645   BILIRUBINUR neg 08/31/2019 0933   BILIRUBINUR Negative 07/29/2018 1538   KETONESUR NEGATIVE 10/18/2020 1645   PROTEINUR NEGATIVE 10/18/2020  1645   UROBILINOGEN 0.2 08/31/2019 0933   NITRITE NEGATIVE 10/18/2020 1645   LEUKOCYTESUR NEGATIVE 10/18/2020 1645    Radiological Exams on Admission: CT Head Wo Contrast  Result Date: 10/18/2020 CLINICAL DATA:  65 year old male with neurologic deficit. EXAM: CT HEAD WITHOUT CONTRAST TECHNIQUE: Contiguous axial images were obtained from the base of the skull through the vertex without intravenous contrast. COMPARISON:  Head CT dated 11/12/2005. FINDINGS: Brain: The ventricles and sulci appropriate size for patient's age. Mild periventricular and deep white matter chronic microvascular ischemic changes noted. There is no acute intracranial hemorrhage. No mass effect or midline shift. No extra-axial fluid collection. Vascular: No hyperdense vessel or unexpected calcification. Skull: Normal. Negative for fracture or focal lesion. Sinuses/Orbits: There is diffuse mucoperiosteal thickening of paranasal sinuses. Mastoid air cells are clear. Other: None IMPRESSION: 1. No acute intracranial pathology. 2. Mild chronic microvascular ischemic changes. 3. Paranasal sinus disease. Electronically Signed   By: Anner Crete M.D.   On: 10/18/2020 16:23   DG Chest Port 1 View  Result Date: 10/18/2020 CLINICAL DATA:  65 year old male with increasing shortness of breath. EXAM: PORTABLE CHEST 1 VIEW COMPARISON:  None. FINDINGS: The heart size and mediastinal contours are within normal limits. Both lungs are clear. The visualized skeletal structures are unremarkable. IMPRESSION: No acute cardiopulmonary process. Electronically Signed   By: Ruthann Cancer MD   On: 10/18/2020 13:45    EKG: Independently reviewed.  A. fib, RBBB  Assessment/Plan Active Problems:   A-fib ([place])  (please populate well all problems here in Problem List. (For example, if patient is on BP meds at home and you resume or decide to hold them, it is a problem that needs to be her. Same for CAD, COPD, HLD and so on)  A. fib with RVR -Cardizem  drip started in the ER, will start metoprolol p.o. 50 mg twice daily and titrate -CHADS2=3 (HTN, TIA, age), no significant shortness of GI bleed, anemia were any bleeding problems, will start Eliquis. Stop ASA, as Eliquis will be superior. -TSH, T4, Echo   COVID -Breakthrough infection -No particular symptoms signs of pneumonia, will hold off antiviral and steroid treatment.  Respiratory distress -Likely from rapid A. fib and suspicion of underlying diastolic  dysfunction and transient decompensation. -Treat A. fib and reevaluate.  Clinically patient is euvolemic.  HTN -Start metoprolol  DVT prophylaxis: Eliquis  code Status: Full Code Family Communication: None at bedside Disposition Plan: Expect 1-2 days of hospital stay. Consults called: None Admission status: PCU   Lequita Halt MD [facility] Pager [phone]  10/18/2020, 7:22 PM

## 2020-10-18 NOTE — ED Triage Notes (Addendum)
Pt reports for the past 3-4 days he started having increase sob, painful urination, and chest tightness. EKG showed a-fib RVR. Pt report no h/o a-fib. Pt HR is 170's in triage. Pt also reports body aches recently. Pt states he think he might have covid. Pt also reports dizziness x2-3 days. Pt denies any LOC.Pt reports he has times where he feels like he is going to pass out. Pt reports weakness all over.Pt reports weakness more so on the left sided. Left arm and numbness.

## 2020-10-18 NOTE — ED Notes (Signed)
Dinner tray ordered.

## 2020-10-18 NOTE — ED Notes (Signed)
Pt given dinner tray.

## 2020-10-19 ENCOUNTER — Encounter: Payer: Medicare Other | Admitting: Family Medicine

## 2020-10-19 ENCOUNTER — Inpatient Hospital Stay (HOSPITAL_COMMUNITY): Payer: Medicare Other

## 2020-10-19 DIAGNOSIS — I4891 Unspecified atrial fibrillation: Secondary | ICD-10-CM | POA: Diagnosis not present

## 2020-10-19 LAB — CBC WITH DIFFERENTIAL/PLATELET
Abs Immature Granulocytes: 0.03 10*3/uL (ref 0.00–0.07)
Basophils Absolute: 0 10*3/uL (ref 0.0–0.1)
Basophils Relative: 0 %
Eosinophils Absolute: 0.1 10*3/uL (ref 0.0–0.5)
Eosinophils Relative: 1 %
HCT: 46.6 % (ref 39.0–52.0)
Hemoglobin: 15.5 g/dL (ref 13.0–17.0)
Immature Granulocytes: 0 %
Lymphocytes Relative: 21 %
Lymphs Abs: 1.6 10*3/uL (ref 0.7–4.0)
MCH: 29.5 pg (ref 26.0–34.0)
MCHC: 33.3 g/dL (ref 30.0–36.0)
MCV: 88.6 fL (ref 80.0–100.0)
Monocytes Absolute: 0.4 10*3/uL (ref 0.1–1.0)
Monocytes Relative: 5 %
Neutro Abs: 5.6 10*3/uL (ref 1.7–7.7)
Neutrophils Relative %: 73 %
Platelets: 229 10*3/uL (ref 150–400)
RBC: 5.26 MIL/uL (ref 4.22–5.81)
RDW: 12.6 % (ref 11.5–15.5)
WBC: 7.8 10*3/uL (ref 4.0–10.5)
nRBC: 0 % (ref 0.0–0.2)

## 2020-10-19 LAB — LIPID PANEL
Cholesterol: 226 mg/dL — ABNORMAL HIGH (ref 0–200)
HDL: 36 mg/dL — ABNORMAL LOW (ref >40)
LDL Cholesterol: 131 mg/dL — ABNORMAL HIGH (ref 0–99)
Total CHOL/HDL Ratio: 6.3 RATIO
Triglycerides: 293 mg/dL — ABNORMAL HIGH (ref <150)
VLDL: 59 mg/dL — ABNORMAL HIGH (ref 0–40)

## 2020-10-19 LAB — ECHOCARDIOGRAM COMPLETE
Area-P 1/2: 2.54 cm2
Height: 67 in
S' Lateral: 3.1 cm
Weight: 2864 oz

## 2020-10-19 LAB — COMPREHENSIVE METABOLIC PANEL
ALT: 33 U/L (ref 0–44)
AST: 21 U/L (ref 15–41)
Albumin: 3.6 g/dL (ref 3.5–5.0)
Alkaline Phosphatase: 59 U/L (ref 38–126)
Anion gap: 10 (ref 5–15)
BUN: 23 mg/dL (ref 8–23)
CO2: 25 mmol/L (ref 22–32)
Calcium: 9.3 mg/dL (ref 8.9–10.3)
Chloride: 102 mmol/L (ref 98–111)
Creatinine, Ser: 1.22 mg/dL (ref 0.61–1.24)
GFR, Estimated: >60 mL/min (ref >60)
Glucose, Bld: 142 mg/dL — ABNORMAL HIGH (ref 70–99)
Potassium: 4.6 mmol/L (ref 3.5–5.1)
Sodium: 137 mmol/L (ref 135–145)
Total Bilirubin: 0.7 mg/dL (ref 0.3–1.2)
Total Protein: 6.4 g/dL — ABNORMAL LOW (ref 6.5–8.1)

## 2020-10-19 LAB — BRAIN NATRIURETIC PEPTIDE: B Natriuretic Peptide: 251.7 pg/mL — ABNORMAL HIGH (ref 0.0–100.0)

## 2020-10-19 LAB — FERRITIN: Ferritin: 203 ng/mL (ref 24–336)

## 2020-10-19 LAB — MAGNESIUM: Magnesium: 2.3 mg/dL (ref 1.7–2.4)

## 2020-10-19 LAB — D-DIMER, QUANTITATIVE: D-Dimer, Quant: 0.43 ug/mL-FEU (ref 0.00–0.50)

## 2020-10-19 LAB — PHOSPHORUS: Phosphorus: 3.7 mg/dL (ref 2.5–4.6)

## 2020-10-19 LAB — C-REACTIVE PROTEIN: CRP: <0.5 mg/dL (ref <1.0)

## 2020-10-19 MED ORDER — BENZONATATE 100 MG PO CAPS: 100.0000 mg | ORAL_CAPSULE | Freq: Three times a day (TID) | ORAL | Status: DC | PRN

## 2020-10-19 MED ORDER — METOPROLOL TARTRATE 25 MG PO TABS
25.0000 mg | ORAL_TABLET | Freq: Two times a day (BID) | ORAL | Status: DC
  Administered 2020-10-19: 25 mg via ORAL

## 2020-10-19 MED ORDER — METOPROLOL TARTRATE 25 MG PO TABS: 50.0000 mg | ORAL_TABLET | Freq: Two times a day (BID) | ORAL | Status: DC

## 2020-10-19 MED ORDER — METOPROLOL TARTRATE 5 MG/5ML IV SOLN
5.0000 mg | Freq: Three times a day (TID) | INTRAVENOUS | Status: DC | PRN
  Administered 2020-10-19: 5 mg via INTRAVENOUS
  Filled 2020-10-19: qty 5

## 2020-10-19 MED ORDER — METOPROLOL TARTRATE 25 MG PO TABS
25.0000 mg | ORAL_TABLET | Freq: Once | ORAL | Status: DC
  Filled 2020-10-19: qty 1

## 2020-10-19 MED ORDER — PANTOPRAZOLE SODIUM 40 MG PO TBEC
40.0000 mg | DELAYED_RELEASE_TABLET | Freq: Every day | ORAL | Status: DC
  Filled 2020-10-19: qty 1

## 2020-10-19 MED ORDER — LACTATED RINGERS IV SOLN: INTRAVENOUS | Status: AC

## 2020-10-19 MED ORDER — METOPROLOL TARTRATE 25 MG PO TABS
25.0000 mg | ORAL_TABLET | Freq: Once | ORAL | Status: AC
  Administered 2020-10-19: 25 mg via ORAL
  Filled 2020-10-19: qty 1

## 2020-10-19 MED ORDER — METOPROLOL TARTRATE 50 MG PO TABS
50.0000 mg | ORAL_TABLET | Freq: Two times a day (BID) | ORAL | Status: DC
  Administered 2020-10-20 – 2020-10-21 (×3): 50 mg via ORAL
  Filled 2020-10-19 (×3): qty 1

## 2020-10-19 MED ORDER — LACTATED RINGERS IV SOLN: INTRAVENOUS | Status: DC

## 2020-10-19 NOTE — ED Notes (Signed)
Spoke with Dr. Marlowe Sax; Metoprolol may be interacting synergistically with Cardizem; Cardizem drip to be weaned off and restarted if HR returns to 120 or higher.

## 2020-10-19 NOTE — Progress Notes (Signed)
Echocardiogram 2D Echocardiogram has been performed.  Alex Huffman 10/19/2020, 2:40 PM

## 2020-10-19 NOTE — ED Notes (Addendum)
Notified Dr. Candiss Norse that pt has maintained Afib but rate has increased 105-120. Pt asymptomatic, denying chest pain, SOB, dizziness.Metoprolol was held earlier in the day due to hypotension and bradycardia. New orders received for metroprolol IV and oral.

## 2020-10-19 NOTE — ED Notes (Signed)
Spoke with Dr. Marlyce Huge about concerns re: Metoprolol dose (may be causing bradycardia and hypotention).  Dr. Marlyce Huge wrote orders to reduce Metoprolol dose tonight with reevaluation in AM.  Report addendum given to [facility], RN on arrival to unit.

## 2020-10-19 NOTE — ED Notes (Signed)
Paged Dr. Ninetta Lights to [facility]

## 2020-10-19 NOTE — ED Notes (Signed)
Pt HR noted to be in upper 50s; Cardizem reduced to 7.5 and Dr. Marlowe Sax paged.

## 2020-10-19 NOTE — Progress Notes (Signed)
PROGRESS NOTE                                                                                                                                                                                                             Patient Demographics:    Alex Huffman, is a 65 y.o. male, DOB - Jan 26, 1956, WLN:[id number]  Outpatient Primary MD for the patient is Jinny Sanders, MD    LOS - 1  Admit date - 10/18/2020    Chief Complaint  Patient presents with  . Dizziness  . Irregular Heart Beat       Brief Narrative (HPI from H&P) Alex Huffman is a 65 y.o. male with medical history significant of hypertension not on medications, Question of TIA, HLD, retinal detachment, hypothyroidism, presented with new onset of palpitations and shortness of breath, in the ER he was diagnosed with COVID-19 infection along with new onset A. fib RVR.   Subjective:    Alex Huffman today has, No headache, No chest pain, No abdominal pain - No Nausea, No new weakness tingling or numbness, no SOB.   Assessment  & Plan :    Active Problems:   Atrial fibrillation with RVR (HCC)   1. Acute COVID-19 infection - currently emptying free on room air, stable inflammatory markers, this likely is incidental finding.  He has taken 1 shot of J&J vaccine in the past.  Will monitor closely.  He does not need any teen specific treatment for now.  Encouraged the patient to sit up in chair in the daytime use I-S and flutter valve for pulmonary toiletry and then prone in bed when at night.  Will advance activity and titrate down oxygen as possible.  SpO2: 97 %  Recent Labs  Lab 10/18/20 1313 10/18/20 1319 10/18/20 2110 10/19/20 0428  WBC 6.1  --   --  7.8  HGB 16.2  --   --  15.5  HCT 50.8  --   --  46.6  PLT 219  --   --  229  CRP  --   --  <0.5 <0.5  BNP  --   --  268.3*  --   DDIMER  --   --  0.54* 0.43  PROCALCITON  --   --  <0.10  --   AST  --   --    --  21  ALT  --   --   --  33  ALKPHOS  --   --   --  59  BILITOT  --   --   --  0.7  ALBUMIN  --   --   --  3.6  SARSCOV2NAA  --  POSITIVE*  --   --      2.  New onset A. fib RVR with some shortness of breath due to acute on chronic diastolic CHF.  S2 score of greater than 2 at the very least,  initially on Cardizem drip, now on oral Lopressor and Eliquis, TSH stable get echocardiogram.  CHF has resolved.  Will monitor closely and request cardiology to evaluate as well.   3.  History of TIA.  Aspirin has been switched to Eliquis.  4.  Hypothyroidism.  On Synthroid.  Stable TSH       Condition - Extremely Guarded  Family Communication  : Wife Alex Huffman [phone] on 10/19/2020 at 9:03 AM full mailbox.  Code Status :  Full  Consults  :  Cards  Procedures  :    CT Head - non acute  TTE   PUD Prophylaxis : PPI  Disposition Plan  :    Status is: Inpatient  Remains inpatient appropriate because:IV treatments appropriate due to intensity of illness or inability to take PO   Dispo: The patient is from: Home              Anticipated d/c is to: Home              Anticipated d/c date is: 2 days              Patient currently is not medically stable to d/c.   DVT Prophylaxis  :  Eliquis  Lab Results  Component Value Date   PLT 229 10/19/2020    Diet :  Diet Order            Diet Heart Room service appropriate? Yes; Fluid consistency: Thin  Diet effective now                  Inpatient Medications  Scheduled Meds: . apixaban  5 mg Oral BID  . vitamin C with rose hips  500 mg Oral Daily  . calcium carbonate  1 tablet Oral Q breakfast  . famotidine  20 mg Oral BID  . levothyroxine  25 mcg Oral Q0600  . metoprolol tartrate  50 mg Oral BID  . multivitamin with minerals  1 tablet Oral Daily  . vitamin E  100 Units Oral Daily   Continuous Infusions: PRN Meds:.acetaminophen, benzonatate, guaiFENesin-codeine, ondansetron (ZOFRAN) IV  Antibiotics  :     Anti-infectives (From admission, onward)   None       Time Spent in minutes  30   Lala Lund M.D on 10/19/2020 at 8:58 AM  To page go to [email or web address]   [facility] -  Office  [phone]     See all Orders from today for further details    Objective:   Vitals:   10/19/20 0745 10/19/20 0800 10/19/20 0815 10/19/20 0845  BP: 115/87 110/83 106/78 (!) 117/93  Pulse: 63 77 71 61  Resp: 15 (!) 22 (!) 22 16  Temp:    97.8 F (36.6 C)  TempSrc:    Oral  SpO2: 97% 95% 96% 97%  Weight:      Height:        Wt Readings from Last  3 Encounters:  10/18/20 81.2 kg  09/07/20 81.6 kg  12/10/19 88 kg    No intake or output data in the 24 hours ending 10/19/20 0858   Physical Exam  Awake Alert, No new F.N deficits, Normal affect [place].AT,PERRAL Supple Neck,No JVD, No cervical lymphadenopathy appriciated.  Symmetrical Chest wall movement, Good air movement bilaterally, CTAB RRR,No Gallops,Rubs or new Murmurs, No Parasternal Heave +ve B.Sounds, Abd Soft, No tenderness, No organomegaly appriciated, No rebound - guarding or rigidity. No Cyanosis, Clubbing or edema, No new Rash or bruise       Data Review:    CBC Recent Labs  Lab 10/18/20 1313 10/19/20 0428  WBC 6.1 7.8  HGB 16.2 15.5  HCT 50.8 46.6  PLT 219 229  MCV 89.9 88.6  MCH 28.7 29.5  MCHC 31.9 33.3  RDW 12.0 12.6  LYMPHSABS  --  1.6  MONOABS  --  0.4  EOSABS  --  0.1  BASOSABS  --  0.0    Recent Labs  Lab 10/18/20 1313 10/18/20 1551 10/18/20 2110 10/19/20 0428  NA 138  --   --  137  K 4.1  --   --  4.6  CL 104  --   --  102  CO2 22  --   --  25  GLUCOSE 129*  --   --  142*  BUN 24*  --   --  23  CREATININE 1.24  --   --  1.22  CALCIUM 9.4  --   --  9.3  AST  --   --   --  21  ALT  --   --   --  33  ALKPHOS  --   --   --  59  BILITOT  --   --   --  0.7  ALBUMIN  --   --   --  3.6  MG  --   --   --  2.3  CRP  --   --  <0.5 <0.5  DDIMER  --   --  0.54* 0.43  PROCALCITON  --    --  <0.10  --   TSH  --  5.431* 3.387  --   HGBA1C 6.0*  --   --   --   BNP  --   --  268.3*  --     ------------------------------------------------------------------------------------------------------------------ Recent Labs    10/19/20 0428  CHOL 226*  HDL 36*  LDLCALC 131*  TRIG 293*  CHOLHDL 6.3    Lab Results  Component Value Date   HGBA1C 6.0 (H) 10/18/2020   ------------------------------------------------------------------------------------------------------------------ Recent Labs    10/18/20 2110  TSH 3.387    Cardiac Enzymes No results for input(s): CKMB, TROPONINI, MYOGLOBIN in the last 168 hours.  Invalid input(s): CK ------------------------------------------------------------------------------------------------------------------    Component Value Date/Time   BNP 268.3 (H) 10/18/2020 2110    Micro Results Recent Results (from the past 240 hour(s))  Resp Panel by RT-PCR (Flu A&B, Covid) Nasopharyngeal Swab     Status: Abnormal   Collection Time: 10/18/20  1:19 PM   Specimen: Nasopharyngeal Swab; Nasopharyngeal(NP) swabs in vial transport medium  Result Value Ref Range Status   SARS Coronavirus 2 by RT PCR POSITIVE (A) NEGATIVE Final    Comment: emailed L. Berdik RN 15:15 10/18/20 ([email or web address]) (NOTE) SARS-CoV-2 target nucleic acids are DETECTED.  The SARS-CoV-2 RNA is generally detectable in upper respiratory specimens during the acute phase of infection. Positive results are indicative of the presence of  the identified virus, but do not rule out bacterial infection or co-infection with other pathogens not detected by the test. Clinical correlation with patient history and other diagnostic information is necessary to determine patient infection status. The expected result is Negative.  Fact Sheet for Patients: EntrepreneurPulse.com.au  Fact Sheet for Healthcare Providers: IncredibleEmployment.be  This test is  not yet approved or cleared by the [place] FDA and  has been authorized for detection and/or diagnosis of SARS-CoV-2 by FDA under an Emergency Use Authorization (EUA).  This EUA will remain in effect (meaning this test can be used) for the duration of  the COVID-1 9 declaration under Section 564(b)(1) of the Act, 21 U.S.C. section 360bbb-3(b)(1), unless the authorization is terminated or revoked sooner.     Influenza A by PCR NEGATIVE NEGATIVE Final   Influenza B by PCR NEGATIVE NEGATIVE Final    Comment: (NOTE) The Xpert Xpress SARS-CoV-2/FLU/RSV plus assay is intended as an aid in the diagnosis of influenza from Nasopharyngeal swab specimens and should not be used as a sole basis for treatment. Nasal washings and aspirates are unacceptable for Xpert Xpress SARS-CoV-2/FLU/RSV testing.  Fact Sheet for Patients: EntrepreneurPulse.com.au  Fact Sheet for Healthcare Providers: IncredibleEmployment.be  This test is not yet approved or cleared by the [place] FDA and has been authorized for detection and/or diagnosis of SARS-CoV-2 by FDA under an Emergency Use Authorization (EUA). This EUA will remain in effect (meaning this test can be used) for the duration of the COVID-19 declaration under Section 564(b)(1) of the Act, 21 U.S.C. section 360bbb-3(b)(1), unless the authorization is terminated or revoked.  Performed at [facility], [place] [address], [place], [place] [address]     Radiology Reports CT Head Wo Contrast  Result Date: 10/18/2020 CLINICAL DATA:  65 year old male with neurologic deficit. EXAM: CT HEAD WITHOUT CONTRAST TECHNIQUE: Contiguous axial images were obtained from the base of the skull through the vertex without intravenous contrast. COMPARISON:  Head CT dated 11/12/2005. FINDINGS: Brain: The ventricles and sulci appropriate size for patient's age. Mild periventricular and deep white matter chronic microvascular  ischemic changes noted. There is no acute intracranial hemorrhage. No mass effect or midline shift. No extra-axial fluid collection. Vascular: No hyperdense vessel or unexpected calcification. Skull: Normal. Negative for fracture or focal lesion. Sinuses/Orbits: There is diffuse mucoperiosteal thickening of paranasal sinuses. Mastoid air cells are clear. Other: None IMPRESSION: 1. No acute intracranial pathology. 2. Mild chronic microvascular ischemic changes. 3. Paranasal sinus disease. Electronically Signed   By: Anner Crete M.D.   On: 10/18/2020 16:23   DG Chest Port 1 View  Result Date: 10/18/2020 CLINICAL DATA:  65 year old male with increasing shortness of breath. EXAM: PORTABLE CHEST 1 VIEW COMPARISON:  None. FINDINGS: The heart size and mediastinal contours are within normal limits. Both lungs are clear. The visualized skeletal structures are unremarkable. IMPRESSION: No acute cardiopulmonary process. Electronically Signed   By: Ruthann Cancer MD   On: 10/18/2020 13:45

## 2020-10-19 NOTE — ED Notes (Signed)
SDU  Breakfast ordered  

## 2020-10-19 NOTE — Consult Note (Addendum)
Cardiology Consultation:   Patient ID: [name] MRN: [id number]; DOB: 03-13-1956  Admit date: 10/18/2020 Date of Consult: 10/19/2020  Primary Care Provider: Jinny Sanders, MD [facility] Cardiologist: Mertie Moores, MD  [place] Electrophysiologist:  None    Patient Profile:   Alex Huffman is a 65 y.o. male with a hx of HTN, TIA, HLD, retinal detachment, hypothyroidism who is being seen today for the evaluation of afib RVR at the request of Dr. Candiss Norse.  History of Present Illness:   [name] is a 65 yo male with PMH noted above. He has been seen by Dr. Nehemiah Massed back in 2017. Notes indicate he had a normal stress test with normal echo, mild MR and TR. He was instructed to follow up as needed.   Presented to the ED with palpitations and shortness of breath that started 2-3 days prior. Symptoms worsened last night and he was only able to walk a short distance to the bathroom from bedroom and was short of breath. No chest pain. Stated he had felt weak and had body aches. Also with dizziness. He was concerned he may have COVID and presented to the ED. Of note he received J&J x1 back in summer of 2021. Michela Pitcher he was at a family event on New Years and several people became sick afterwards.    In the ED his labs showed stable electrolytes, Cr 1.24, hsTn 8>>8>>9, WBC 6.1, Hgb 16.2, TSH 5.431, BNP 268>>251, LDL 131. COVID positive. CT head was negative. EKG showed Afib RVR 166bpm. He was started on Diltiazem drip with bolus. He was admitted to [facility]. Started on metoprolol 50mg  BID along with Eliquis 5mg  BID on admission.   Echo showed normal EF of 55-60% with no rWMA noted. His HR slowed while on Dilt drip and was eventually stopped. Review of telemetry shows HR in the 90-100 range.   Past Medical History:  Diagnosis Date  . Anxiety   . Cataract   . Chronic headaches   . Helicobacter pylori gastritis 2009   EGD  . Hyperlipemia   . Personal history of colonic polyps      Past Surgical History:  Procedure Laterality Date  . COLONOSCOPY  , 06/08/2011   2009: two 3 mm hyperplastic polyps, lipoma, internal hemorrjhoids 2012: 68mm cecal serrated adenoma, lipoma, internal hemorrhoids  . HERNIA REPAIR  05/04  . ROTATOR CUFF REPAIR  05/06  . UPPER GASTROINTESTINAL ENDOSCOPY  02/02/2008   erosive gastropathy     Home Medications:  Prior to Admission medications   Medication Sig Start Date End Date Taking? Authorizing Provider  Ascorbic Acid (VITAMIN C WITH ROSE HIPS) 500 MG tablet Take 500 mg by mouth daily.   Yes [provider]  aspirin 325 MG EC tablet Take 325 mg by mouth daily as needed for pain.   Yes [provider]  benzonatate (TESSALON) 100 MG capsule Take 1-2 capsules (100-200 mg total) by mouth 3 (three) times daily as needed. Patient taking differently: Take 100-200 mg by mouth 3 (three) times daily as needed for cough. 09/07/20  Yes Elby Beck, FNP  calcium carbonate (OS-CAL - DOSED IN MG OF ELEMENTAL CALCIUM) 1250 (500 Ca) MG tablet Take 1 tablet by mouth daily with breakfast.   Yes [provider]  cholecalciferol (VITAMIN D3) 25 MCG (1000 UNIT) tablet Take 1,000 Units by mouth daily.   Yes [provider]  guaiFENesin-codeine (ROBITUSSIN AC) 100-10 MG/5ML syrup Take 5-10 mLs by mouth 3 (three) times  daily as needed for cough. 09/09/20  Yes Elby Beck, FNP  levothyroxine (SYNTHROID) 25 MCG tablet Take 1 tablet by mouth once daily Patient taking differently: Take 25 mcg by mouth daily before breakfast. 08/12/20  Yes Tonia Ghent, MD  Lutein 6 MG TABS Take 6 mg by mouth daily at 12 noon.   Yes [provider]  Multiple Vitamin (MULTIVITAMIN) capsule Take 1 capsule by mouth daily.   Yes [provider]  naproxen sodium (ALEVE) 220 MG tablet Take 220 mg by mouth daily as needed (pain).   Yes [provider]  OVER THE COUNTER MEDICATION Take 300 mg by mouth in the morning,  at noon, and at bedtime. Kyolic aged garlic extract   Yes [provider]  VITAMIN E PO Take 1 capsule by mouth daily. 10/15/12  Yes [provider]  famotidine (PEPCID) 20 MG tablet Take 1 tablet (20 mg total) by mouth 2 (two) times daily. 08/31/19   Elby Beck, FNP    Inpatient Medications: Scheduled Meds: . apixaban  5 mg Oral BID  . vitamin C with rose hips  500 mg Oral Daily  . calcium carbonate  1 tablet Oral Q breakfast  . famotidine  20 mg Oral BID  . levothyroxine  25 mcg Oral Q0600  . metoprolol tartrate  50 mg Oral BID  . multivitamin with minerals  1 tablet Oral Daily  . pantoprazole  40 mg Oral Daily  . vitamin E  100 Units Oral Daily   Continuous Infusions: . lactated ringers 125 mL/hr at 10/19/20 1308   PRN Meds: acetaminophen, benzonatate, guaiFENesin-codeine, ondansetron (ZOFRAN) IV  Allergies:    Allergies  Allergen Reactions  . Lidocaine Hypertension  . Mepivacaine Hypertension  . Typhoid Vi Polysaccharide Vaccine Anaphylaxis and Other (See Comments)    Comatose, almost died   . Amlodipine Other (See Comments)    Leg pain and fatigue  . Doxycycline Other (See Comments)    Unknown reaction  . Nitrofurantoin Other (See Comments) and Nausea And Vomiting  . Sulfa Antibiotics Hives    Social History:   Social History   Socioeconomic History  . Marital status: Married    Spouse name: Not on file  . Number of children: 3  . Years of education: Not on file  . Highest education level: Not on file  Occupational History  . Occupation: Retired  Tobacco Use  . Smoking status: Never Smoker  . Smokeless tobacco: Never Used  Vaping Use  . Vaping Use: Never used  Substance and Sexual Activity  . Alcohol use: Not Currently    Comment: Occasional beer  . Drug use: No  . Sexual activity: Yes    Partners: Female    Birth control/protection: None  Other Topics Concern  . Not on file  Social History Narrative  . Not on file    Social Determinants of Health   Financial Resource Strain: Low Risk   . Difficulty of Paying Living Expenses: Not hard at all  Food Insecurity: No Food Insecurity  . Worried About Charity fundraiser in the Last Year: Never true  . Ran Out of Food in the Last Year: Never true  Transportation Needs: No Transportation Needs  . Lack of Transportation (Medical): No  . Lack of Transportation (Non-Medical): No  Physical Activity: Inactive  . Days of Exercise per Week: 0 days  . Minutes of Exercise per Session: 0 min  Stress: No Stress Concern Present  . Feeling  of Stress : Not at all  Social Connections: Not on file  Intimate Partner Violence: Not At Risk  . Fear of Current or Ex-Partner: No  . Emotionally Abused: No  . Physically Abused: No  . Sexually Abused: No    Family History:    Family History  Problem Relation Age of Onset  . Heart disease Father   . Celiac disease Mother   . Colon cancer Neg Hx   . Esophageal cancer Neg Hx   . Liver cancer Neg Hx   . Pancreatic cancer Neg Hx   . Rectal cancer Neg Hx   . Stomach cancer Neg Hx   . Allergic rhinitis Neg Hx      ROS:  Please see the history of present illness.   All other ROS reviewed and negative.     Physical Exam/Data:   Vitals:   10/19/20 1145 10/19/20 1315 10/19/20 1400 10/19/20 1445  BP: 114/89 124/82 99/80 116/88  Pulse: (!) 42 91 99 92  Resp: 17 (!) 21 (!) 22 19  Temp:      TempSrc:      SpO2: 97% 99% 97% 97%  Weight:      Height:       No intake or output data in the 24 hours ending 10/19/20 1553 Last 3 Weights 10/18/2020 10/11/2020 09/07/2020  Weight (lbs) 179 lb (No Data) 180 lb  Weight (kg) 81.194 kg (No Data) 81.647 kg     Body mass index is 28.04 kg/m.   Exam per MD  EKG:  The EKG was personally reviewed and demonstrates:  Afib RVR 166bpm Telemetry:  Telemetry was personally reviewed and demonstrates: Atrial fibrillation HR 90-100s range  Relevant CV Studies:  Echo:  10/19/20  IMPRESSIONS    1. Left ventricular ejection fraction, by estimation, is 55 to 60%. The  left ventricle has normal function. The left ventricle has no regional  wall motion abnormalities. There is moderate left ventricular hypertrophy.  Left ventricular diastolic  parameters are indeterminate.  2. Right ventricular systolic function is normal. The right ventricular  size is normal.  3. The mitral valve is abnormal. No evidence of mitral valve  regurgitation. No evidence of mitral stenosis. Moderate mitral annular  calcification.  4. The aortic valve is tricuspid. Aortic valve regurgitation is not  visualized. Mild to moderate aortic valve sclerosis/calcification is  present, without any evidence of aortic stenosis.  5. The inferior vena cava is normal in size with greater than 50%  respiratory variability, suggesting right atrial pressure of 3 mmHg.   Laboratory Data:  High Sensitivity Troponin:   Recent Labs  Lab 10/18/20 1313 10/18/20 1825 10/18/20 2110  TROPONINIHS 8 8 9      Chemistry Recent Labs  Lab 10/18/20 1313 10/19/20 0428  NA 138 137  K 4.1 4.6  CL 104 102  CO2 22 25  GLUCOSE 129* 142*  BUN 24* 23  CREATININE 1.24 1.22  CALCIUM 9.4 9.3  GFRNONAA >60 >60  ANIONGAP 12 10    Recent Labs  Lab 10/19/20 0428  PROT 6.4*  ALBUMIN 3.6  AST 21  ALT 33  ALKPHOS 59  BILITOT 0.7   Hematology Recent Labs  Lab 10/18/20 1313 10/19/20 0428  WBC 6.1 7.8  RBC 5.65 5.26  HGB 16.2 15.5  HCT 50.8 46.6  MCV 89.9 88.6  MCH 28.7 29.5  MCHC 31.9 33.3  RDW 12.0 12.6  PLT 219 229   BNP Recent Labs  Lab 10/18/20 2110 10/19/20 0428  BNP 268.3* 251.7*    DDimer  Recent Labs  Lab 10/18/20 2110 10/19/20 0428  DDIMER 0.54* 0.43     Radiology/Studies:  CT Head Wo Contrast  Result Date: 10/18/2020 CLINICAL DATA:  64 year old male with neurologic deficit. EXAM: CT HEAD WITHOUT CONTRAST TECHNIQUE: Contiguous axial images were obtained from  the base of the skull through the vertex without intravenous contrast. COMPARISON:  Head CT dated 11/12/2005. FINDINGS: Brain: The ventricles and sulci appropriate size for patient's age. Mild periventricular and deep white matter chronic microvascular ischemic changes noted. There is no acute intracranial hemorrhage. No mass effect or midline shift. No extra-axial fluid collection. Vascular: No hyperdense vessel or unexpected calcification. Skull: Normal. Negative for fracture or focal lesion. Sinuses/Orbits: There is diffuse mucoperiosteal thickening of paranasal sinuses. Mastoid air cells are clear. Other: None IMPRESSION: 1. No acute intracranial pathology. 2. Mild chronic microvascular ischemic changes. 3. Paranasal sinus disease. Electronically Signed   By: Anner Crete M.D.   On: 10/18/2020 16:23   DG Chest Port 1 View  Result Date: 10/18/2020 CLINICAL DATA:  65 year old male with increasing shortness of breath. EXAM: PORTABLE CHEST 1 VIEW COMPARISON:  None. FINDINGS: The heart size and mediastinal contours are within normal limits. Both lungs are clear. The visualized skeletal structures are unremarkable. IMPRESSION: No acute cardiopulmonary process. Electronically Signed   By: Ruthann Cancer MD   On: 10/18/2020 13:45   ECHOCARDIOGRAM COMPLETE  Result Date: 10/19/2020    ECHOCARDIOGRAM REPORT   Patient Name:   Alex Huffman Date of Exam: 10/19/2020 Medical Rec #:  [id number]       Height:       67.0 in Accession #:    [id number]      Weight:       179.0 lb Date of Birth:  1956/09/10       BSA:          1.929 m Patient Age:    78 years        BP:           157/129 mmHg Patient Gender: M               HR:           99 bpm. Exam Location:  Inpatient Procedure: 2D Echo, Cardiac Doppler and Color Doppler Indications:    I48.0 Paroxysmal atrial fibrillation  History:        Patient has no prior history of Echocardiogram examinations.                 Risk Factors:Dyslipidemia. COVID-19 Positive.   Sonographer:    Jonelle Sidle Dance Referring Phys: [id number] [place]  1. Left ventricular ejection fraction, by estimation, is 55 to 60%. The left ventricle has normal function. The left ventricle has no regional wall motion abnormalities. There is moderate left ventricular hypertrophy. Left ventricular diastolic parameters are indeterminate.  2. Right ventricular systolic function is normal. The right ventricular size is normal.  3. The mitral valve is abnormal. No evidence of mitral valve regurgitation. No evidence of mitral stenosis. Moderate mitral annular calcification.  4. The aortic valve is tricuspid. Aortic valve regurgitation is not visualized. Mild to moderate aortic valve sclerosis/calcification is present, without any evidence of aortic stenosis.  5. The inferior vena cava is normal in size with greater than 50% respiratory variability, suggesting right atrial pressure of 3 mmHg. FINDINGS  Left Ventricle: Left ventricular ejection fraction, by estimation, is 55 to 60%. The left ventricle  has normal function. The left ventricle has no regional wall motion abnormalities. The left ventricular internal cavity size was normal in size. There is  moderate left ventricular hypertrophy. Left ventricular diastolic parameters are indeterminate. Right Ventricle: The right ventricular size is normal. No increase in right ventricular wall thickness. Right ventricular systolic function is normal. Left Atrium: Left atrial size was normal in size. Right Atrium: Right atrial size was normal in size. Pericardium: There is no evidence of pericardial effusion. Mitral Valve: The mitral valve is abnormal. There is mild thickening of the mitral valve leaflet(s). There is mild calcification of the mitral valve leaflet(s). Moderate mitral annular calcification. No evidence of mitral valve regurgitation. No evidence  of mitral valve stenosis. Tricuspid Valve: The tricuspid valve is normal in structure. Tricuspid valve  regurgitation is not demonstrated. No evidence of tricuspid stenosis. Aortic Valve: The aortic valve is tricuspid. Aortic valve regurgitation is not visualized. Mild to moderate aortic valve sclerosis/calcification is present, without any evidence of aortic stenosis. Pulmonic Valve: The pulmonic valve was normal in structure. Pulmonic valve regurgitation is not visualized. No evidence of pulmonic stenosis. Aorta: The aortic root is normal in size and structure. Venous: The inferior vena cava is normal in size with greater than 50% respiratory variability, suggesting right atrial pressure of 3 mmHg. IAS/Shunts: No atrial level shunt detected by color flow Doppler.  LEFT VENTRICLE PLAX 2D LVIDd:         3.60 cm LVIDs:         3.10 cm LV PW:         1.20 cm LV IVS:        1.40 cm LVOT diam:     2.20 cm LV SV:         42 LV SV Index:   22 LVOT Area:     3.80 cm  RIGHT VENTRICLE          IVC RV Basal diam:  3.10 cm  IVC diam: 1.70 cm RV Mid diam:    2.20 cm TAPSE (M-mode): 2.2 cm LEFT ATRIUM             Index       RIGHT ATRIUM           Index LA diam:        3.70 cm 1.92 cm/m  RA Area:     14.90 cm LA Vol (A2C):   51.6 ml 26.75 ml/m RA Volume:   33.40 ml  17.31 ml/m LA Vol (A4C):   29.8 ml 15.45 ml/m LA Biplane Vol: 38.6 ml 20.01 ml/m  AORTIC VALVE LVOT Vmax:   70.45 cm/s LVOT Vmean:  50.000 cm/s LVOT VTI:    0.112 m  AORTA Ao Root diam: 3.70 cm Ao Asc diam:  3.50 cm MITRAL VALVE MV Area (PHT): 2.54 cm    SHUNTS MV Decel Time: 299 msec    Systemic VTI:  0.11 m MV E velocity: 54.60 cm/s  Systemic Diam: 2.20 cm Jenkins Rouge MD Electronically signed by Jenkins Rouge MD Signature Date/Time: 10/19/2020/3:10:23 PM    Final      Assessment and Plan:   Alex Huffman is a 65 y.o. male with a hx of HTN, TIA, HLD, retinal detachment, hypothyroidism who is being seen today for the evaluation of afib RVR at the request of Dr. Candiss Norse.  1. New onset Afib RVR: presented with palpitations for the past 2-3 days prior to  admission. Placed on Dilt drip on admission along with metoprolol  50mg  BID. HR improved, now only on metoprolol. Has been started on Eliquis 5mg  BID. Echo showed normal EF with no rWMA. HRs have improved. CHA2DS2-VASc Score of 4.  -- would continue on metoprolol and Eliquis. If he does not convert prior to discharge, will plan for outpatient follow up in several weeks to discuss cardioversion if remains in Afib at that time  2. Acute COVID-19 infection: stable on RA. He has not required any specific treatment at this time. Management per primary  3. TIA: had been on ASA as an outpatient. Now transitioned to Eliquis.   For questions or updates, please contact [place] Please consult [email or web address] for contact info under    Signed, Reino Bellis, NP  10/19/2020 3:53 PM   Attending Note:   The patient was seen and examined.  Agree with assessment and plan as noted above.  Changes made to the above note as needed.  Patient seen and independently examined with  Reino Bellis, NP .   We discussed all aspects of the encounter. I agree with the assessment and plan as stated above.  1.   Atrial fibrillation: Loveless presents with rapid atrial fibrillation.  He was actually doing fairly well on metoprolol.  They held his metoprolol this morning and is heart rate is gotten little bit fast.  He cannot really tell that his heart rate is regular but can feel some shortness of breath.  Is overall not uncomfortable.  His CHA2DS2-VASc score is between 1 and 2.  He is 65 years old.  He may have some hypertension although he is not on any medications.  For now we will continue Eliquis 5 mg twice a day.  I will reduce his metoprolol to 25 mg a day.  We will plan on following up with him in the next 2 to 3 weeks.  We will schedule a cardioversion for him at that time.  I have stressed the importance of continuing Eliquis through that time.  2.  Chest pressure: He describes some chest pressure.  Some  aspects of this chest pressure are atypical.  He describes the pressure with exertion but also at rest.  He does not necessarily get this chest pressure all the time.  For now he seems like a low risk patient.  His presenting symptoms are not consistent with ACS.  We will schedule him for exercise test once he is over this COVID infection.   I have spent a total of 40 minutes with patient reviewing hospital  notes , telemetry, EKGs, labs and examining patient as well as establishing an assessment and plan that was discussed with the patient. > 50% of time was spent in direct patient care.   Thayer Headings, Brooke Bonito., MD, [facility] 10/19/2020, 4:47 PM [address],  [place] Pager [phone]

## 2020-10-19 NOTE — Evaluation (Signed)
Physical Therapy Evaluation Patient Details Name: Alex Huffman MRN: [id number] DOB: 1955/11/16 Today's Date: 10/19/2020   History of Present Illness  65yo male c/o new palpatations and SOB. Found to be in rapid A-fib pattern and Covid positive. PMH HLD, rotator cuff repair  Clinical Impression   Patient received transferring from chair to bed, very pleasant and cooperative today. Able to mobilize on an independent basis with no difficulty, HR in A-fib pattern from 90-110 BPM and SpO2 no lower than 93% on room air. Had lots of questions regarding covid, vaccines, A-fib, and prognosis for returning to his regular exercise programming- answered what I could within PT scope, and encouraged him to ask remaining questions to his attending MD and cardiologist. Left in chair with all needs met. No indication for skilled PT services at this time as he is fully independent with mobility- signing off, thank you for the referral!     Follow Up Recommendations No PT follow up    Equipment Recommendations  None recommended by PT    Recommendations for Other Services       Precautions / Restrictions Precautions Precautions: Other (comment) Precaution Comments: covid positive Restrictions Weight Bearing Restrictions: No      Mobility  Bed Mobility Overal bed mobility: Independent                  Transfers Overall transfer level: Independent Equipment used: None                Ambulation/Gait   Gait Distance (Feet): 80 Feet Assistive device: None Gait Pattern/deviations: WFL(Within Functional Limits);Step-through pattern Gait velocity: WNL   General Gait Details: no significant gait deviations noted, balance WNL  Stairs            Wheelchair Mobility    Modified Rankin (Stroke Patients Only)       Balance Overall balance assessment: Independent                                           Pertinent Vitals/Pain Pain Assessment:  No/denies pain    Home Living Family/patient expects to be discharged to:: Private residence Living Arrangements: Spouse/significant other Available Help at Discharge: Family;Available 24 hours/day;Other (Comment) (wife has not taken covid test) Type of Home: House Home Access: Stairs to enter   [employer] of Steps: front has 4 steps with U rail, garage has 3 steps and a shoe rack he can hold going in the garage steps Home Layout: Two level Home Equipment: Crutches      Prior Function Level of Independence: Independent               Hand Dominance        Extremity/Trunk Assessment   Upper Extremity Assessment Upper Extremity Assessment: Overall WFL for tasks assessed    Lower Extremity Assessment Lower Extremity Assessment: Overall WFL for tasks assessed    Cervical / Trunk Assessment Cervical / Trunk Assessment: Normal  Communication   Communication: No difficulties  Cognition Arousal/Alertness: Awake/alert Behavior During Therapy: WFL for tasks assessed/performed Overall Cognitive Status: Within Functional Limits for tasks assessed                                        General Comments      Exercises  Assessment/Plan    PT Assessment Patent does not need any further PT services  PT Problem List         PT Treatment Interventions      PT Goals (Current goals can be found in the Care Plan section)  Acute Rehab PT Goals Patient Stated Goal: go home PT Goal Formulation: With patient Time For Goal Achievement: 11/02/20 Potential to Achieve Goals: Good    Frequency     Barriers to discharge        Co-evaluation               AM-PAC PT "6 Clicks" Mobility  Outcome Measure Help needed turning from your back to your side while in a flat bed without using bedrails?: None Help needed moving from lying on your back to sitting on the side of a flat bed without using bedrails?: None Help needed moving to and  from a bed to a chair (including a wheelchair)?: None Help needed standing up from a chair using your arms (e.g., wheelchair or bedside chair)?: None Help needed to walk in hospital room?: None Help needed climbing 3-5 steps with a railing? : A Little 6 Click Score: 23    End of Session   Activity Tolerance: Patient tolerated treatment well Patient left: with call bell/phone within reach;in chair Nurse Communication: Mobility status PT Visit Diagnosis: Muscle weakness (generalized) (M62.81)    Time: 1740-9927 PT Time Calculation (min) (ACUTE ONLY): 28 min   Charges:   PT Evaluation $PT Eval Low Complexity: 1 Low PT Treatments $Gait Training: 8-22 mins        Windell Norfolk, DPT, PN1   Supplemental Physical Therapist [place]    Pager [phone] Acute Rehab Office [phone]

## 2020-10-19 NOTE — Discharge Instructions (Signed)

## 2020-10-20 ENCOUNTER — Inpatient Hospital Stay (HOSPITAL_COMMUNITY): Payer: Medicare Other

## 2020-10-20 DIAGNOSIS — U071 COVID-19: Secondary | ICD-10-CM

## 2020-10-20 DIAGNOSIS — I10 Essential (primary) hypertension: Secondary | ICD-10-CM

## 2020-10-20 DIAGNOSIS — I4891 Unspecified atrial fibrillation: Secondary | ICD-10-CM | POA: Diagnosis not present

## 2020-10-20 LAB — CBC WITH DIFFERENTIAL/PLATELET
Abs Immature Granulocytes: 0.01 10*3/uL (ref 0.00–0.07)
Basophils Absolute: 0 10*3/uL (ref 0.0–0.1)
Basophils Relative: 0 %
Eosinophils Absolute: 0.4 10*3/uL (ref 0.0–0.5)
Eosinophils Relative: 5 %
HCT: 47.1 % (ref 39.0–52.0)
Hemoglobin: 15 g/dL (ref 13.0–17.0)
Immature Granulocytes: 0 %
Lymphocytes Relative: 39 %
Lymphs Abs: 2.9 10*3/uL (ref 0.7–4.0)
MCH: 28.7 pg (ref 26.0–34.0)
MCHC: 31.8 g/dL (ref 30.0–36.0)
MCV: 90.2 fL (ref 80.0–100.0)
Monocytes Absolute: 0.6 10*3/uL (ref 0.1–1.0)
Monocytes Relative: 8 %
Neutro Abs: 3.5 10*3/uL (ref 1.7–7.7)
Neutrophils Relative %: 48 %
Platelets: 223 10*3/uL (ref 150–400)
RBC: 5.22 MIL/uL (ref 4.22–5.81)
RDW: 12.3 % (ref 11.5–15.5)
WBC: 7.4 10*3/uL (ref 4.0–10.5)
nRBC: 0 % (ref 0.0–0.2)

## 2020-10-20 LAB — D-DIMER, QUANTITATIVE: D-Dimer, Quant: 0.27 ug/mL-FEU (ref 0.00–0.50)

## 2020-10-20 LAB — COMPREHENSIVE METABOLIC PANEL
ALT: 28 U/L (ref 0–44)
AST: 18 U/L (ref 15–41)
Albumin: 3.5 g/dL (ref 3.5–5.0)
Alkaline Phosphatase: 53 U/L (ref 38–126)
Anion gap: 9 (ref 5–15)
BUN: 28 mg/dL — ABNORMAL HIGH (ref 8–23)
CO2: 29 mmol/L (ref 22–32)
Calcium: 8.9 mg/dL (ref 8.9–10.3)
Chloride: 105 mmol/L (ref 98–111)
Creatinine, Ser: 1.48 mg/dL — ABNORMAL HIGH (ref 0.61–1.24)
GFR, Estimated: 52 mL/min — ABNORMAL LOW (ref >60)
Glucose, Bld: 145 mg/dL — ABNORMAL HIGH (ref 70–99)
Potassium: 4.2 mmol/L (ref 3.5–5.1)
Sodium: 143 mmol/L (ref 135–145)
Total Bilirubin: 0.7 mg/dL (ref 0.3–1.2)
Total Protein: 6.3 g/dL — ABNORMAL LOW (ref 6.5–8.1)

## 2020-10-20 LAB — URINALYSIS, ROUTINE W REFLEX MICROSCOPIC
Bilirubin Urine: NEGATIVE
Glucose, UA: NEGATIVE mg/dL
Hgb urine dipstick: NEGATIVE
Ketones, ur: NEGATIVE mg/dL
Leukocytes,Ua: NEGATIVE
Nitrite: NEGATIVE
Protein, ur: NEGATIVE mg/dL
Specific Gravity, Urine: 1.01 (ref 1.005–1.030)
pH: 7 (ref 5.0–8.0)

## 2020-10-20 LAB — MAGNESIUM: Magnesium: 2.1 mg/dL (ref 1.7–2.4)

## 2020-10-20 LAB — C-REACTIVE PROTEIN: CRP: 0.7 mg/dL (ref <1.0)

## 2020-10-20 LAB — MRSA PCR SCREENING: MRSA by PCR: NEGATIVE

## 2020-10-20 LAB — BRAIN NATRIURETIC PEPTIDE: B Natriuretic Peptide: 226.8 pg/mL — ABNORMAL HIGH (ref 0.0–100.0)

## 2020-10-20 MED ORDER — LACTATED RINGERS IV SOLN: INTRAVENOUS | Status: AC

## 2020-10-20 MED ORDER — ATORVASTATIN CALCIUM 40 MG PO TABS: 40.0000 mg | ORAL_TABLET | Freq: Every day | ORAL | Status: DC

## 2020-10-20 NOTE — Progress Notes (Signed)
PROGRESS NOTE                                                                                                                                                                                                             Patient Demographics:    Alex Huffman, is a 65 y.o. male, DOB - November 01, 1955, [id number]  Outpatient Primary MD for the patient is Jinny Sanders, MD    LOS - 2  Admit date - 10/18/2020    Chief Complaint  Patient presents with  . Dizziness  . Irregular Heart Beat       Brief Narrative (HPI from H&P) Alex Huffman is a 65 y.o. male with medical history significant of hypertension not on medications, Question of TIA, HLD, retinal detachment, hypothyroidism, presented with new onset of palpitations and shortness of breath, in the ER he was diagnosed with COVID-19 infection along with new onset A. fib RVR.   Subjective:   Patient in bed, appears comfortable, denies any headache, no fever, no chest pain or pressure, no shortness of breath , no abdominal pain. No focal weakness.   Assessment  & Plan :      1. Acute COVID-19 infection - currently emptying free on room air, stable inflammatory markers, this likely is incidental finding.  He has taken 1 shot of J&J vaccine in the past.  Will monitor closely.  He does not need any teen specific treatment for now.  Encouraged the patient to sit up in chair in the daytime use I-S and flutter valve for pulmonary toiletry and then prone in bed when at night.  Will advance activity and titrate down oxygen as possible.  SpO2: 92 %  Recent Labs  Lab 10/18/20 1313 10/18/20 1319 10/18/20 2110 10/19/20 0428 10/20/20 0030  WBC 6.1  --   --  7.8 7.4  HGB 16.2  --   --  15.5 15.0  HCT 50.8  --   --  46.6 47.1  PLT 219  --   --  229 223  CRP  --   --  <0.5 <0.5 0.7  BNP  --   --  268.3* 251.7* 226.8*  DDIMER  --   --  0.54* 0.43 0.27  PROCALCITON  --   --   <0.10  --   --   AST  --   --   --  21 18  ALT  --   --   --  33 28  ALKPHOS  --   --   --  59 53  BILITOT  --   --   --  0.7 0.7  ALBUMIN  --   --   --  3.6 3.5  SARSCOV2NAA  --  POSITIVE*  --   --   --      2.  New onset A. fib RVR with some shortness of breath due to acute on chronic diastolic CHF.  S2 score of greater than 2 at the very least,  initially on Cardizem drip, now on oral Lopressor and Eliquis, TSH & echocardiogram stable.  CHF has resolved.  Appreciate cardiology input.   3.  History of TIA.  Aspirin has been switched to Eliquis.  4.  Hypothyroidism.  On Synthroid.  Stable TSH.  5.  AKI.  Mild likely due to dehydration and hypotension, hydrate, check UA and renal ultrasound and monitor.  6. Dyslipidemia.  Placed on statin.       Condition - Extremely Guarded  Family Communication  : Wife Alex Huffman [phone] on 10/19/2020 at 9:03 AM full mailbox, updated 10/20/20  Code Status :  Full  Consults  :  Cards  Procedures  :    CT Head - non acute  TTE  - 1. Left ventricular ejection fraction, by estimation, is 55 to 60%. The left ventricle has normal function. The left ventricle has no regional wall motion abnormalities. There is moderate left ventricular hypertrophy. Left ventricular diastolic parameters are indeterminate.  2. Right ventricular systolic function is normal. The right ventricular size is normal.  3. The mitral valve is abnormal. No evidence of mitral valve regurgitation. No evidence of mitral stenosis. Moderate mitral annular calcification.  4. The aortic valve is tricuspid. Aortic valve regurgitation is not visualized. Mild to moderate aortic valve sclerosis/calcification is present, without any evidence of aortic stenosis.  5. The inferior vena cava is normal in size with greater than 50% respiratory variability, suggesting right atrial pressure of 3 mmHg.   PUD Prophylaxis : PPI  Disposition Plan  :    Status is: Inpatient  Remains inpatient  appropriate because:IV treatments appropriate due to intensity of illness or inability to take PO   Dispo: The patient is from: Home              Anticipated d/c is to: Home              Anticipated d/c date is: 2 days              Patient currently is not medically stable to d/c.   DVT Prophylaxis  :  Eliquis  Lab Results  Component Value Date   PLT 223 10/20/2020    Diet :  Diet Order            Diet Heart Room service appropriate? Yes; Fluid consistency: Thin  Diet effective now                  Inpatient Medications  Scheduled Meds: . apixaban  5 mg Oral BID  . vitamin C with rose hips  500 mg Oral Daily  . calcium carbonate  1 tablet Oral Q breakfast  . famotidine  20 mg Oral BID  . levothyroxine  25 mcg Oral Q0600  . metoprolol tartrate  50 mg Oral BID  . multivitamin with minerals  1 tablet Oral Daily  . vitamin E  100 Units Oral Daily   Continuous Infusions: . lactated ringers     PRN Meds:.acetaminophen, benzonatate, guaiFENesin-codeine, metoprolol tartrate, ondansetron (ZOFRAN) IV  Antibiotics  :    Anti-infectives (From admission, onward)   None       Time Spent in minutes  30   Lala Lund M.D on 10/20/2020 at 8:46 AM  To page go to [email or web address]   [facility] -  Office  [phone]     See all Orders from today for further details    Objective:   Vitals:   10/19/20 2335 10/20/20 0300 10/20/20 0400 10/20/20 0800  BP: 121/80 101/83 104/69 (!) 124/96  Pulse: 78 82 81 69  Resp: 20 18 18 18   Temp: 98.2 F (36.8 C)  98.6 F (37 C) 97.6 F (36.4 C)  TempSrc: Oral  Oral Oral  SpO2: 94% 93% 93% 92%  Weight:      Height:        Wt Readings from Last 3 Encounters:  10/19/20 83.2 kg  09/07/20 81.6 kg  12/10/19 88 kg     Intake/Output Summary (Last 24 hours) at 10/20/2020 0846 Last data filed at 10/19/2020 2200 Gross per 24 hour  Intake 640 ml  Output 1 ml  Net 639 ml     Physical Exam  Awake Alert, No new F.N  deficits, Normal affect Alex Huffman,PERRAL Supple Neck,No JVD, No cervical lymphadenopathy appriciated.  Symmetrical Chest wall movement, Good air movement bilaterally, CTAB RRR,No Gallops, Rubs or new Murmurs, No Parasternal Heave +ve B.Sounds, Abd Soft, No tenderness, No organomegaly appriciated, No rebound - guarding or rigidity. No Cyanosis, Clubbing or edema, No new Rash or bruise       Data Review:    CBC Recent Labs  Lab 10/18/20 1313 10/19/20 0428 10/20/20 0030  WBC 6.1 7.8 7.4  HGB 16.2 15.5 15.0  HCT 50.8 46.6 47.1  PLT 219 229 223  MCV 89.9 88.6 90.2  MCH 28.7 29.5 28.7  MCHC 31.9 33.3 31.8  RDW 12.0 12.6 12.3  LYMPHSABS  --  1.6 2.9  MONOABS  --  0.4 0.6  EOSABS  --  0.1 0.4  BASOSABS  --  0.0 0.0    Recent Labs  Lab 10/18/20 1313 10/18/20 1551 10/18/20 2110 10/19/20 0428 10/20/20 0030  NA 138  --   --  137 143  K 4.1  --   --  4.6 4.2  CL 104  --   --  102 105  CO2 22  --   --  25 29  GLUCOSE 129*  --   --  142* 145*  BUN 24*  --   --  23 28*  CREATININE 1.24  --   --  1.22 1.48*  CALCIUM 9.4  --   --  9.3 8.9  AST  --   --   --  21 18  ALT  --   --   --  33 28  ALKPHOS  --   --   --  59 53  BILITOT  --   --   --  0.7 0.7  ALBUMIN  --   --   --  3.6 3.5  MG  --   --   --  2.3 2.1  CRP  --   --  <0.5 <0.5 0.7  DDIMER  --   --  0.54* 0.43 0.27  PROCALCITON  --   --  <0.10  --   --   TSH  --  5.431* 3.387  --   --   HGBA1C 6.0*  --   --   --   --   BNP  --   --  268.3* 251.7* 226.8*    ------------------------------------------------------------------------------------------------------------------ Recent Labs    10/19/20 0428  CHOL 226*  HDL 36*  LDLCALC 131*  TRIG 293*  CHOLHDL 6.3    Lab Results  Component Value Date   HGBA1C 6.0 (H) 10/18/2020   ------------------------------------------------------------------------------------------------------------------ Recent Labs    10/18/20 2110  TSH 3.387    Cardiac Enzymes No  results for input(s): CKMB, TROPONINI, MYOGLOBIN in the last 168 hours.  Invalid input(s): CK ------------------------------------------------------------------------------------------------------------------    Component Value Date/Time   BNP 226.8 (H) 10/20/2020 0030    Micro Results Recent Results (from the past 240 hour(s))  Resp Panel by RT-PCR (Flu A&B, Covid) Nasopharyngeal Swab     Status: Abnormal   Collection Time: 10/18/20  1:19 PM   Specimen: Nasopharyngeal Swab; Nasopharyngeal(NP) swabs in vial transport medium  Result Value Ref Range Status   SARS Coronavirus 2 by RT PCR POSITIVE (A) NEGATIVE Final    Comment: emailed L. Berdik RN 15:15 10/18/20 ([email or web address]) (NOTE) SARS-CoV-2 target nucleic acids are DETECTED.  The SARS-CoV-2 RNA is generally detectable in upper respiratory specimens during the acute phase of infection. Positive results are indicative of the presence of the identified virus, but do not rule out bacterial infection or co-infection with other pathogens not detected by the test. Clinical correlation with patient history and other diagnostic information is necessary to determine patient infection status. The expected result is Negative.  Fact Sheet for Patients: EntrepreneurPulse.com.au  Fact Sheet for Healthcare Providers: IncredibleEmployment.be  This test is not yet approved or cleared by the [place] FDA and  has been authorized for detection and/or diagnosis of SARS-CoV-2 by FDA under an Emergency Use Authorization (EUA).  This EUA will remain in effect (meaning this test can be used) for the duration of  the COVID-1 9 declaration under Section 564(b)(1) of the Act, 21 U.S.C. section 360bbb-3(b)(1), unless the authorization is terminated or revoked sooner.     Influenza A by PCR NEGATIVE NEGATIVE Final   Influenza B by PCR NEGATIVE NEGATIVE Final    Comment: (NOTE) The Xpert Xpress SARS-CoV-2/FLU/RSV  plus assay is intended as an aid in the diagnosis of influenza from Nasopharyngeal swab specimens and should not be used as a sole basis for treatment. Nasal washings and aspirates are unacceptable for Xpert Xpress SARS-CoV-2/FLU/RSV testing.  Fact Sheet for Patients: EntrepreneurPulse.com.au  Fact Sheet for Healthcare Providers: IncredibleEmployment.be  This test is not yet approved or cleared by the [place] FDA and has been authorized for detection and/or diagnosis of SARS-CoV-2 by FDA under an Emergency Use Authorization (EUA). This EUA will remain in effect (meaning this test can be used) for the duration of the COVID-19 declaration under Section 564(b)(1) of the Act, 21 U.S.C. section 360bbb-3(b)(1), unless the authorization is terminated or revoked.  Performed at [facility], [place] [address] [place],  [address]   MRSA PCR Screening     Status: None   Collection Time: 10/19/20 11:39 PM   Specimen: Nasopharyngeal  Result Value Ref Range Status   MRSA by PCR NEGATIVE NEGATIVE Final    Comment:        The GeneXpert MRSA Assay (FDA approved for NASAL specimens only), is one component of a comprehensive MRSA colonization surveillance program. It is not intended to diagnose MRSA infection nor to guide  or monitor treatment for MRSA infections. Performed at [facility], [place] [address] [place], [place] [address]     Radiology Reports CT Head Wo Contrast  Result Date: 10/18/2020 CLINICAL DATA:  65 year old male with neurologic deficit. EXAM: CT HEAD WITHOUT CONTRAST TECHNIQUE: Contiguous axial images were obtained from the base of the skull through the vertex without intravenous contrast. COMPARISON:  Head CT dated 11/12/2005. FINDINGS: Brain: The ventricles and sulci appropriate size for patient's age. Mild periventricular and deep white matter chronic microvascular ischemic changes noted. There is no acute  intracranial hemorrhage. No mass effect or midline shift. No extra-axial fluid collection. Vascular: No hyperdense vessel or unexpected calcification. Skull: Normal. Negative for fracture or focal lesion. Sinuses/Orbits: There is diffuse mucoperiosteal thickening of paranasal sinuses. Mastoid air cells are clear. Other: None IMPRESSION: 1. No acute intracranial pathology. 2. Mild chronic microvascular ischemic changes. 3. Paranasal sinus disease. Electronically Signed   By: Anner Crete M.D.   On: 10/18/2020 16:23   DG Chest Port 1 View  Result Date: 10/18/2020 CLINICAL DATA:  65 year old male with increasing shortness of breath. EXAM: PORTABLE CHEST 1 VIEW COMPARISON:  None. FINDINGS: The heart size and mediastinal contours are within normal limits. Both lungs are clear. The visualized skeletal structures are unremarkable. IMPRESSION: No acute cardiopulmonary process. Electronically Signed   By: Ruthann Cancer MD   On: 10/18/2020 13:45   ECHOCARDIOGRAM COMPLETE  Result Date: 10/19/2020    ECHOCARDIOGRAM REPORT   Patient Name:   YICHEN GILARDI Date of Exam: 10/19/2020 Medical Rec #:  [id number]       Height:       67.0 in Accession #:    [id number]      Weight:       179.0 lb Date of Birth:  1956/04/21       BSA:          1.929 m Patient Age:    59 years        BP:           157/129 mmHg Patient Gender: M               HR:           99 bpm. Exam Location:  Inpatient Procedure: 2D Echo, Cardiac Doppler and Color Doppler Indications:    I48.0 Paroxysmal atrial fibrillation  History:        Patient has no prior history of Echocardiogram examinations.                 Risk Factors:Dyslipidemia. COVID-19 Positive.  Sonographer:    Jonelle Sidle Dance Referring Phys: [id number] [place]  1. Left ventricular ejection fraction, by estimation, is 55 to 60%. The left ventricle has normal function. The left ventricle has no regional wall motion abnormalities. There is moderate left ventricular hypertrophy. Left  ventricular diastolic parameters are indeterminate.  2. Right ventricular systolic function is normal. The right ventricular size is normal.  3. The mitral valve is abnormal. No evidence of mitral valve regurgitation. No evidence of mitral stenosis. Moderate mitral annular calcification.  4. The aortic valve is tricuspid. Aortic valve regurgitation is not visualized. Mild to moderate aortic valve sclerosis/calcification is present, without any evidence of aortic stenosis.  5. The inferior vena cava is normal in size with greater than 50% respiratory variability, suggesting right atrial pressure of 3 mmHg. FINDINGS  Left Ventricle: Left ventricular ejection fraction, by estimation, is 55 to 60%. The left ventricle has  normal function. The left ventricle has no regional wall motion abnormalities. The left ventricular internal cavity size was normal in size. There is  moderate left ventricular hypertrophy. Left ventricular diastolic parameters are indeterminate. Right Ventricle: The right ventricular size is normal. No increase in right ventricular wall thickness. Right ventricular systolic function is normal. Left Atrium: Left atrial size was normal in size. Right Atrium: Right atrial size was normal in size. Pericardium: There is no evidence of pericardial effusion. Mitral Valve: The mitral valve is abnormal. There is mild thickening of the mitral valve leaflet(s). There is mild calcification of the mitral valve leaflet(s). Moderate mitral annular calcification. No evidence of mitral valve regurgitation. No evidence  of mitral valve stenosis. Tricuspid Valve: The tricuspid valve is normal in structure. Tricuspid valve regurgitation is not demonstrated. No evidence of tricuspid stenosis. Aortic Valve: The aortic valve is tricuspid. Aortic valve regurgitation is not visualized. Mild to moderate aortic valve sclerosis/calcification is present, without any evidence of aortic stenosis. Pulmonic Valve: The pulmonic valve  was normal in structure. Pulmonic valve regurgitation is not visualized. No evidence of pulmonic stenosis. Aorta: The aortic root is normal in size and structure. Venous: The inferior vena cava is normal in size with greater than 50% respiratory variability, suggesting right atrial pressure of 3 mmHg. IAS/Shunts: No atrial level shunt detected by color flow Doppler.  LEFT VENTRICLE PLAX 2D LVIDd:         3.60 cm LVIDs:         3.10 cm LV PW:         1.20 cm LV IVS:        1.40 cm LVOT diam:     2.20 cm LV SV:         42 LV SV Index:   22 LVOT Area:     3.80 cm  RIGHT VENTRICLE          IVC RV Basal diam:  3.10 cm  IVC diam: 1.70 cm RV Mid diam:    2.20 cm TAPSE (M-mode): 2.2 cm LEFT ATRIUM             Index       RIGHT ATRIUM           Index LA diam:        3.70 cm 1.92 cm/m  RA Area:     14.90 cm LA Vol (A2C):   51.6 ml 26.75 ml/m RA Volume:   33.40 ml  17.31 ml/m LA Vol (A4C):   29.8 ml 15.45 ml/m LA Biplane Vol: 38.6 ml 20.01 ml/m  AORTIC VALVE LVOT Vmax:   70.45 cm/s LVOT Vmean:  50.000 cm/s LVOT VTI:    0.112 m  AORTA Ao Root diam: 3.70 cm Ao Asc diam:  3.50 cm MITRAL VALVE MV Area (PHT): 2.54 cm    SHUNTS MV Decel Time: 299 msec    Systemic VTI:  0.11 m MV E velocity: 54.60 cm/s  Systemic Diam: 2.20 cm Jenkins Rouge MD Electronically signed by Jenkins Rouge MD Signature Date/Time: 10/19/2020/3:10:23 PM    Final

## 2020-10-20 NOTE — Progress Notes (Signed)
Progress Note  Patient Name: Alex Huffman Date of Encounter: 10/20/2020  [facility] Cardiologist: Mertie Moores, MD   Subjective   65 year old gentleman with a history of hypertension, TIA, hyperlipidemia, retinal detachment, hypothyroidism who was seen yesterday for an episode of atrial fibrillation with rapid ventricular response.  He also was found to have COVID.  CHADS2VASC is 107 ( age 38, HTN, TIA)  He is on eliquis 5 BID  His HR is well controlled  Inpatient Medications    Scheduled Meds: . apixaban  5 mg Oral BID  . vitamin C with rose hips  500 mg Oral Daily  . calcium carbonate  1 tablet Oral Q breakfast  . famotidine  20 mg Oral BID  . levothyroxine  25 mcg Oral Q0600  . metoprolol tartrate  25 mg Oral Once  . metoprolol tartrate  50 mg Oral BID  . multivitamin with minerals  1 tablet Oral Daily  . vitamin E  100 Units Oral Daily   Continuous Infusions: . lactated ringers     PRN Meds: acetaminophen, benzonatate, guaiFENesin-codeine, metoprolol tartrate, ondansetron (ZOFRAN) IV   Vital Signs    Vitals:   10/19/20 2131 10/19/20 2335 10/20/20 0300 10/20/20 0400  BP:  121/80 101/83 104/69  Pulse: 94 78 82 81  Resp:  20 18 18   Temp:  98.2 F (36.8 C)  98.6 F (37 C)  TempSrc:  Oral  Oral  SpO2:  94% 93% 93%  Weight:      Height:        Intake/Output Summary (Last 24 hours) at 10/20/2020 0837 Last data filed at 10/19/2020 2200 Gross per 24 hour  Intake 640 ml  Output 1 ml  Net 639 ml   Last 3 Weights 10/19/2020 10/18/2020 10/11/2020  Weight (lbs) 183 lb 6.8 oz 179 lb (No Data)  Weight (kg) 83.2 kg 81.194 kg (No Data)      Telemetry    afib with HR of 80s- Personally Reviewed  ECG     - Personally Reviewed  Physical Exam   GEN: No acute distress.   Neck: No JVD Cardiac:  irreg. Irreg.   Respiratory: Clear to auscultation bilaterally. GI: Soft, nontender, non-distended  MS: No edema; No deformity. Neuro:  Nonfocal  Psych: Normal  affect   Labs    High Sensitivity Troponin:   Recent Labs  Lab 10/18/20 1313 10/18/20 1825 10/18/20 2110  TROPONINIHS 8 8 9       Chemistry Recent Labs  Lab 10/18/20 1313 10/19/20 0428 10/20/20 0030  NA 138 137 143  K 4.1 4.6 4.2  CL 104 102 105  CO2 22 25 29   GLUCOSE 129* 142* 145*  BUN 24* 23 28*  CREATININE 1.24 1.22 1.48*  CALCIUM 9.4 9.3 8.9  PROT  --  6.4* 6.3*  ALBUMIN  --  3.6 3.5  AST  --  21 18  ALT  --  33 28  ALKPHOS  --  59 53  BILITOT  --  0.7 0.7  GFRNONAA >60 >60 52*  ANIONGAP 12 10 9      Hematology Recent Labs  Lab 10/18/20 1313 10/19/20 0428 10/20/20 0030  WBC 6.1 7.8 7.4  RBC 5.65 5.26 5.22  HGB 16.2 15.5 15.0  HCT 50.8 46.6 47.1  MCV 89.9 88.6 90.2  MCH 28.7 29.5 28.7  MCHC 31.9 33.3 31.8  RDW 12.0 12.6 12.3  PLT 219 229 223    BNP Recent Labs  Lab 10/18/20 2110 10/19/20 0428 10/20/20  0030  BNP 268.3* 251.7* 226.8*     DDimer  Recent Labs  Lab 10/18/20 2110 10/19/20 0428 10/20/20 0030  DDIMER 0.54* 0.43 0.27     Radiology    CT Head Wo Contrast  Result Date: 10/18/2020 CLINICAL DATA:  65 year old male with neurologic deficit. EXAM: CT HEAD WITHOUT CONTRAST TECHNIQUE: Contiguous axial images were obtained from the base of the skull through the vertex without intravenous contrast. COMPARISON:  Head CT dated 11/12/2005. FINDINGS: Brain: The ventricles and sulci appropriate size for patient's age. Mild periventricular and deep white matter chronic microvascular ischemic changes noted. There is no acute intracranial hemorrhage. No mass effect or midline shift. No extra-axial fluid collection. Vascular: No hyperdense vessel or unexpected calcification. Skull: Normal. Negative for fracture or focal lesion. Sinuses/Orbits: There is diffuse mucoperiosteal thickening of paranasal sinuses. Mastoid air cells are clear. Other: None IMPRESSION: 1. No acute intracranial pathology. 2. Mild chronic microvascular ischemic changes. 3.  Paranasal sinus disease. Electronically Signed   By: Anner Crete M.D.   On: 10/18/2020 16:23   DG Chest Port 1 View  Result Date: 10/18/2020 CLINICAL DATA:  65 year old male with increasing shortness of breath. EXAM: PORTABLE CHEST 1 VIEW COMPARISON:  None. FINDINGS: The heart size and mediastinal contours are within normal limits. Both lungs are clear. The visualized skeletal structures are unremarkable. IMPRESSION: No acute cardiopulmonary process. Electronically Signed   By: Ruthann Cancer MD   On: 10/18/2020 13:45   ECHOCARDIOGRAM COMPLETE  Result Date: 10/19/2020    ECHOCARDIOGRAM REPORT   Patient Name:   Alex Huffman Date of Exam: 10/19/2020 Medical Rec #:  [id number]       Height:       67.0 in Accession #:    [id number]      Weight:       179.0 lb Date of Birth:  [birth year]       BSA:          1.929 m Patient Age:    53 years        BP:           157/129 mmHg Patient Gender: M               HR:           99 bpm. Exam Location:  Inpatient Procedure: 2D Echo, Cardiac Doppler and Color Doppler Indications:    I48.0 Paroxysmal atrial fibrillation  History:        Patient has no prior history of Echocardiogram examinations.                 Risk Factors:Dyslipidemia. COVID-19 Positive.  Sonographer:    Jonelle Sidle Dance Referring Phys: [id number] [place]  1. Left ventricular ejection fraction, by estimation, is 55 to 60%. The left ventricle has normal function. The left ventricle has no regional wall motion abnormalities. There is moderate left ventricular hypertrophy. Left ventricular diastolic parameters are indeterminate.  2. Right ventricular systolic function is normal. The right ventricular size is normal.  3. The mitral valve is abnormal. No evidence of mitral valve regurgitation. No evidence of mitral stenosis. Moderate mitral annular calcification.  4. The aortic valve is tricuspid. Aortic valve regurgitation is not visualized. Mild to moderate aortic valve sclerosis/calcification  is present, without any evidence of aortic stenosis.  5. The inferior vena cava is normal in size with greater than 50% respiratory variability, suggesting right atrial pressure of 3 mmHg. FINDINGS  Left Ventricle: Left ventricular ejection fraction,  by estimation, is 55 to 60%. The left ventricle has normal function. The left ventricle has no regional wall motion abnormalities. The left ventricular internal cavity size was normal in size. There is  moderate left ventricular hypertrophy. Left ventricular diastolic parameters are indeterminate. Right Ventricle: The right ventricular size is normal. No increase in right ventricular wall thickness. Right ventricular systolic function is normal. Left Atrium: Left atrial size was normal in size. Right Atrium: Right atrial size was normal in size. Pericardium: There is no evidence of pericardial effusion. Mitral Valve: The mitral valve is abnormal. There is mild thickening of the mitral valve leaflet(s). There is mild calcification of the mitral valve leaflet(s). Moderate mitral annular calcification. No evidence of mitral valve regurgitation. No evidence  of mitral valve stenosis. Tricuspid Valve: The tricuspid valve is normal in structure. Tricuspid valve regurgitation is not demonstrated. No evidence of tricuspid stenosis. Aortic Valve: The aortic valve is tricuspid. Aortic valve regurgitation is not visualized. Mild to moderate aortic valve sclerosis/calcification is present, without any evidence of aortic stenosis. Pulmonic Valve: The pulmonic valve was normal in structure. Pulmonic valve regurgitation is not visualized. No evidence of pulmonic stenosis. Aorta: The aortic root is normal in size and structure. Venous: The inferior vena cava is normal in size with greater than 50% respiratory variability, suggesting right atrial pressure of 3 mmHg. IAS/Shunts: No atrial level shunt detected by color flow Doppler.  LEFT VENTRICLE PLAX 2D LVIDd:         3.60 cm LVIDs:          3.10 cm LV PW:         1.20 cm LV IVS:        1.40 cm LVOT diam:     2.20 cm LV SV:         42 LV SV Index:   22 LVOT Area:     3.80 cm  RIGHT VENTRICLE          IVC RV Basal diam:  3.10 cm  IVC diam: 1.70 cm RV Mid diam:    2.20 cm TAPSE (M-mode): 2.2 cm LEFT ATRIUM             Index       RIGHT ATRIUM           Index LA diam:        3.70 cm 1.92 cm/m  RA Area:     14.90 cm LA Vol (A2C):   51.6 ml 26.75 ml/m RA Volume:   33.40 ml  17.31 ml/m LA Vol (A4C):   29.8 ml 15.45 ml/m LA Biplane Vol: 38.6 ml 20.01 ml/m  AORTIC VALVE LVOT Vmax:   70.45 cm/s LVOT Vmean:  50.000 cm/s LVOT VTI:    0.112 m  AORTA Ao Root diam: 3.70 cm Ao Asc diam:  3.50 cm MITRAL VALVE MV Area (PHT): 2.54 cm    SHUNTS MV Decel Time: 299 msec    Systemic VTI:  0.11 m MV E velocity: 54.60 cm/s  Systemic Diam: 2.20 cm Jenkins Rouge MD Electronically signed by Jenkins Rouge MD Signature Date/Time: 10/19/2020/3:10:23 PM    Final     Cardiac Studies     Patient Profile     65 y.o. male with newly diagnosed afib   Assessment & Plan    1.  Atrial fibrillation: [place] presents with rapid atrial fibrillation in the setting of a COVID infection.  His CHA2DS2-VASc score is 19 (age, TIA, hypertension)  Is currently on Eliquis.  His heart rate and blood pressure are currently stable on metoprolol.  We will continue metoprolol and Eliquis.  We will see him back in the office in approximately 3 weeks and schedule him for a cardioversion.  It is imperative that he takes his Eliquis every day until that time.  2.  Chest pressure: He describes some episodes of chest pressure.  We will plan on doing a stress test once he is over this COVID infection.  3.  Palpitations: He describes palpitations that are consistent with premature ventricular contractions.  We can place a monitor on him as an outpatient if needed.   For questions or updates, please contact [place] Please consult [email or web address] for contact info under         Signed, Mertie Moores, MD  10/20/2020, 8:37 AM

## 2020-10-20 NOTE — Plan of Care (Signed)

## 2020-10-20 NOTE — Evaluation (Signed)
Occupational Therapy Evaluation Patient Details Name: Alex Huffman MRN: [id number] DOB: Feb 20, 1956 Today's Date: 10/20/2020    History of Present Illness 65yo male c/o new palpatations and SOB. Found to be in rapid A-fib pattern and Covid positive. PMH HLD, rotator cuff repair   Clinical Impression   Pt admitted with the above diagnoses and presents with below problem list. Pt will benefit from continued acute OT to address the below listed deficits and maximize independence with basic ADLs prior to d/c home. At baseline, pt is independent with ADLs. Pt is currently setup to supervision with UB ADLs, supervision to min guard assist with LB ADLs and functional transfers. Pt limited by lightheadedness which worsened in standing, unable to stand for full bp reading. Pt reports he feels a bit worse than yesterday. VSS. Began energy conservation education. Plan to follow acutely.     Follow Up Recommendations  No OT follow up;Supervision - Intermittent    Equipment Recommendations  None recommended by OT    Recommendations for Other Services       Precautions / Restrictions Precautions Precautions: Other (comment) Precaution Comments: covid positive Restrictions Weight Bearing Restrictions: No      Mobility Bed Mobility Overal bed mobility: Independent                  Transfers Overall transfer level: Needs assistance Equipment used: None Transfers: Sit to/from Stand Sit to Stand: Supervision;Min guard         General transfer comment: worsening lightheadedness in standing.    Balance Overall balance assessment: Mild deficits observed, not formally tested                                         ADL either performed or assessed with clinical judgement   ADL Overall ADL's : Needs assistance/impaired Eating/Feeding: Independent   Grooming: Set up   Upper Body Bathing: Set up;Sitting   Lower Body Bathing: Min guard;Sit to/from stand    Upper Body Dressing : Set up;Sitting   Lower Body Dressing: Min guard;Sit to/from stand   Toilet Transfer: Min guard;Ambulation   Toileting- Clothing Manipulation and Hygiene: Min guard;Sit to/from stand   Tub/ Shower Transfer: Min guard   Functional mobility during ADLs: Min guard General ADL Comments: Pt reports feeling internally flushed, lightheaded "feel funny" worse in standing. Stood a minute then need to sit back down.     Vision         Perception     Praxis      Pertinent Vitals/Pain Pain Assessment: No/denies pain     Hand Dominance     Extremity/Trunk Assessment Upper Extremity Assessment Upper Extremity Assessment: Overall WFL for tasks assessed   Lower Extremity Assessment Lower Extremity Assessment: Overall WFL for tasks assessed   Cervical / Trunk Assessment Cervical / Trunk Assessment: Normal   Communication Communication Communication: No difficulties   Cognition Arousal/Alertness: Awake/alert Behavior During Therapy: WFL for tasks assessed/performed Overall Cognitive Status: Within Functional Limits for tasks assessed                                     General Comments  pt unable to stand for full bp reading in standing    Exercises     Shoulder Instructions      Home Living Family/patient expects to  be discharged to:: Private residence Living Arrangements: Spouse/significant other Available Help at Discharge: Family;Available 24 hours/day;Other (Comment) Type of Home: House Home Access: Stairs to enter [employer] of Steps: front has 4 steps with U rail, garage has 3 steps and a shoe rack he can hold going in the garage steps   Home Layout: Two level Alternate Level Stairs-Number of Steps: flight with U rail   Bathroom Shower/Tub: Walk-in shower;Tub/shower unit   [employer]: Standard     Home Equipment: Crutches          Prior Functioning/Environment Level of Independence: Independent                  OT Problem List: Impaired balance (sitting and/or standing);Decreased activity tolerance;Decreased knowledge of use of DME or AE;Decreased knowledge of precautions;Cardiopulmonary status limiting activity      OT Treatment/Interventions: Self-care/ADL training;Energy conservation;DME and/or AE instruction;Therapeutic activities;Patient/family education;Balance training    OT Goals(Current goals can be found in the care plan section) Acute Rehab OT Goals Patient Stated Goal: feel better, go home OT Goal Formulation: With patient Time For Goal Achievement: 11/03/20 Potential to Achieve Goals: Good ADL Goals Pt Will Perform Grooming: Independently;standing Pt Will Transfer to Toilet: Independently;ambulating Pt Will Perform Toileting - Clothing Manipulation and hygiene: Independently;sit to/from stand Pt Will Perform Tub/Shower Transfer: Independently;ambulating  OT Frequency: Min 2X/week   Barriers to D/C:            Co-evaluation              AM-PAC OT "6 Clicks" Daily Activity     Outcome Measure Help from another person eating meals?: None Help from another person taking care of personal grooming?: None Help from another person toileting, which includes using toliet, bedpan, or urinal?: A Little Help from another person bathing (including washing, rinsing, drying)?: A Little Help from another person to put on and taking off regular upper body clothing?: None Help from another person to put on and taking off regular lower body clothing?: A Little 6 Click Score: 21   End of Session Nurse Communication: Mobility status;Other (comment) (lightheaded, worse in standing)  Activity Tolerance: Patient limited by fatigue;Other (comment) ("feel funny"/lightheaded, worse in standing) Patient left: in bed;with call bell/phone within reach  OT Visit Diagnosis: Unsteadiness on feet (R26.81)                Time: 1255-1320 OT Time Calculation (min): 25  min Charges:  OT General Charges $OT Visit: 1 Visit OT Evaluation $OT Eval Low Complexity: 1 Low OT Treatments $Self Care/Home Management : 8-22 mins  Tyrone Schimke, OT Acute Rehabilitation Services Pager: [phone] Office: [phone]   Hortencia Pilar 10/20/2020, 1:34 PM

## 2020-10-20 NOTE — Progress Notes (Signed)
Patient refused Lipitor this morning, patient states "I can control my cholesterol with diet and don't want to start any more new medications". He would like to follow up outpatient for Lipitor.

## 2020-10-21 ENCOUNTER — Ambulatory Visit: Payer: Medicare Other | Admitting: Family Medicine

## 2020-10-21 ENCOUNTER — Other Ambulatory Visit (HOSPITAL_COMMUNITY): Payer: Self-pay | Admitting: Internal Medicine

## 2020-10-21 DIAGNOSIS — N179 Acute kidney failure, unspecified: Secondary | ICD-10-CM

## 2020-10-21 DIAGNOSIS — U071 COVID-19: Secondary | ICD-10-CM | POA: Diagnosis not present

## 2020-10-21 DIAGNOSIS — I4891 Unspecified atrial fibrillation: Secondary | ICD-10-CM | POA: Diagnosis not present

## 2020-10-21 LAB — CBC WITH DIFFERENTIAL/PLATELET
Abs Immature Granulocytes: 0.03 10*3/uL (ref 0.00–0.07)
Basophils Absolute: 0 10*3/uL (ref 0.0–0.1)
Basophils Relative: 0 %
Eosinophils Absolute: 0.4 10*3/uL (ref 0.0–0.5)
Eosinophils Relative: 4 %
HCT: 45.8 % (ref 39.0–52.0)
Hemoglobin: 15.2 g/dL (ref 13.0–17.0)
Immature Granulocytes: 0 %
Lymphocytes Relative: 31 %
Lymphs Abs: 3 10*3/uL (ref 0.7–4.0)
MCH: 29.6 pg (ref 26.0–34.0)
MCHC: 33.2 g/dL (ref 30.0–36.0)
MCV: 89.3 fL (ref 80.0–100.0)
Monocytes Absolute: 0.6 10*3/uL (ref 0.1–1.0)
Monocytes Relative: 6 %
Neutro Abs: 5.6 10*3/uL (ref 1.7–7.7)
Neutrophils Relative %: 59 %
Platelets: 253 10*3/uL (ref 150–400)
RBC: 5.13 MIL/uL (ref 4.22–5.81)
RDW: 12.4 % (ref 11.5–15.5)
WBC: 9.7 10*3/uL (ref 4.0–10.5)
nRBC: 0 % (ref 0.0–0.2)

## 2020-10-21 LAB — COMPREHENSIVE METABOLIC PANEL
ALT: 28 U/L (ref 0–44)
AST: 19 U/L (ref 15–41)
Albumin: 3.4 g/dL — ABNORMAL LOW (ref 3.5–5.0)
Alkaline Phosphatase: 51 U/L (ref 38–126)
Anion gap: 9 (ref 5–15)
BUN: 24 mg/dL — ABNORMAL HIGH (ref 8–23)
CO2: 27 mmol/L (ref 22–32)
Calcium: 9.7 mg/dL (ref 8.9–10.3)
Chloride: 106 mmol/L (ref 98–111)
Creatinine, Ser: 1.25 mg/dL — ABNORMAL HIGH (ref 0.61–1.24)
GFR, Estimated: >60 mL/min (ref >60)
Glucose, Bld: 141 mg/dL — ABNORMAL HIGH (ref 70–99)
Potassium: 4.3 mmol/L (ref 3.5–5.1)
Sodium: 142 mmol/L (ref 135–145)
Total Bilirubin: 0.7 mg/dL (ref 0.3–1.2)
Total Protein: 5.9 g/dL — ABNORMAL LOW (ref 6.5–8.1)

## 2020-10-21 LAB — C-REACTIVE PROTEIN: CRP: 1 mg/dL — ABNORMAL HIGH (ref <1.0)

## 2020-10-21 LAB — BRAIN NATRIURETIC PEPTIDE: B Natriuretic Peptide: 215.6 pg/mL — ABNORMAL HIGH (ref 0.0–100.0)

## 2020-10-21 LAB — MAGNESIUM: Magnesium: 2.1 mg/dL (ref 1.7–2.4)

## 2020-10-21 LAB — D-DIMER, QUANTITATIVE: D-Dimer, Quant: <0.27 ug/mL-FEU (ref 0.00–0.50)

## 2020-10-21 MED ORDER — METOPROLOL TARTRATE 50 MG PO TABS: 50.0000 mg | ORAL_TABLET | Freq: Two times a day (BID) | ORAL | 0 refills | Status: DC

## 2020-10-21 MED ORDER — APIXABAN 5 MG PO TABS: 5.0000 mg | ORAL_TABLET | Freq: Two times a day (BID) | ORAL | 0 refills | Status: DC

## 2020-10-21 MED ORDER — ATORVASTATIN CALCIUM 40 MG PO TABS: 40.0000 mg | ORAL_TABLET | Freq: Every day | ORAL | 0 refills | Status: DC

## 2020-10-21 MED FILL — ELIQUIS 5 MG TABLET: 5 | 30 days supply | Qty: 60 | Fill #0

## 2020-10-21 MED FILL — METOPROLOL TARTRATE 50 MG T: 50 | 30 days supply | Qty: 60 | Fill #0

## 2020-10-21 MED FILL — ATORVASTATIN CALCIUM 40 MG: 40 | 30 days supply | Qty: 30 | Fill #0

## 2020-10-21 NOTE — Discharge Summary (Signed)
Alex Huffman, is a 65 y.o. male  DOB October 25, 1955  MRN [id number].  Admission date:  10/18/2020  Admitting Physician  Lequita Halt, MD  Discharge Date:  10/21/2020   Primary MD  Jinny Sanders, MD  Recommendations for primary care physician for things to follow:  - Please check CBC, CMP during next visit. - to follow with cardiology on 11/18/2020, and to remain on Eliquis till seen by cardiology.   Admission Diagnosis  Cough [R05.9] A-fib ([place]) [I48.91] Atrial fibrillation with RVR ([facility]) [I48.91] COVID [U07.1] COVID-19 [U07.1]   Discharge Diagnosis  Cough [R05.9] A-fib ([place]) [I48.91] Atrial fibrillation with RVR (H[facility]) [I48.91] COVID [U07.1] COVID-19 [U07.1]   Active Problems:   Atrial fibrillation with RVR ([place])   COVID-19   Primary hypertension      Past Medical History:  Diagnosis Date  . Anxiety   . Cataract   . Chronic headaches   . Helicobacter pylori gastritis 2009   EGD  . Hyperlipemia   . Personal history of colonic polyps     Past Surgical History:  Procedure Laterality Date  . COLONOSCOPY  , 06/08/2011   2009: two 3 mm hyperplastic polyps, lipoma, internal hemorrjhoids 2012: 2mm cecal serrated adenoma, lipoma, internal hemorrhoids  . HERNIA REPAIR  05/04  . ROTATOR CUFF REPAIR  05/06  . UPPER GASTROINTESTINAL ENDOSCOPY  02/02/2008   erosive gastropathy       History of present illness and  Hospital Course:     Kindly see H&P for history of present illness and admission details, please review complete Labs, Consult reports and Test reports for all details in brief  HPI  from the history and physical done on the day of admission 10/19/2019  HPI: Alex Huffman is a 65 y.o. male with medical history significant of hypertension not on medications, Question of TIA, HLD, retinal detachment, hypothyroidism, presented with new onset of palpitations and shortness of  breath.  Symptoms started 2 to 3 days ago, episodes of palpitations and shortness of breath, initially shortness of breath only happens with activity but last night patient found only short distance walk from bathroom to bedroom because of significant shortness of breath.  No chest pain, no cough no fever chills.  Patient received J&J COVID vaccination x1 in summer 2021. He went to family gather-together on New Year's Day and several people became sick afterwards.   Hospital Course    1. Acute COVID-19 infection -currently with no oxygen requirement, on room air, stable inflammatory markers, this likely is incidental finding.  He has taken 1 shot of J&J vaccine in the past.  He does not need any  specific treatment for now. -I have discussed with the patient to come back to ED if he develops any shortness of breath, and to monitor his oxygen saturation closely, report he has a pulse ox at home.  2.  New onset A. fib RVR with some shortness of breath due to acute on chronic diastolic CHF. CHA2DS2-VASc score is 7 (age, TIA,  hypertension)  initially on Cardizem drip, now on oral Lopressor and Eliquis, TSH & echocardiogram stable.  CHF has resolved.  Appreciate cardiology input.   3.  History of TIA.  Aspirin has been switched to Eliquis.  4.  Hypothyroidism.  On Synthroid.  Stable TSH.  5.  AKI.  Mild likely due to dehydration and hypotension  6. Dyslipidemia.  Placed on statin.  Discharge Condition:  stable   Follow UP   Follow-up Information    Slaughters, Bhavinkumar, PA Follow up.   Specialty: Cardiology Why: [place] office - Friday November 18, 2020 at 10:45 AM (Arrive by 10:30 AM). Vin is one of the PAs that works with our cardiology team. Contact information: [address] [place] [address] [phone]                 Discharge Instructions  and  Discharge Medications     Discharge Instructions    Amb referral to AFIB Clinic    Complete by: As directed    Diet - low sodium heart healthy   Complete by: As directed    Discharge instructions   Complete by: As directed    Follow with Primary MD Jinny Sanders, MD in 10 days   Get CBC, CMP,  checked  by Primary MD next visit.    Activity: As tolerated with Full fall precautions use walker/cane & assistance as needed   Disposition Home    Diet: Heart Healthy  , with feeding assistance and aspiration precautions.   On your next visit with your primary care physician please Get Medicines reviewed and adjusted.   Please request your Prim.MD to go over all Hospital Tests and Procedure/Radiological results at the follow up, please get all Hospital records sent to your Prim MD by signing hospital release before you go home.   If you experience worsening of your admission symptoms, develop shortness of breath, life threatening emergency, suicidal or homicidal thoughts you must seek medical attention immediately by calling 911 or calling your MD immediately  if symptoms less severe.  You Must read complete instructions/literature along with all the possible adverse reactions/side effects for all the Medicines you take and that have been prescribed to you. Take any new Medicines after you have completely understood and accpet all the possible adverse reactions/side effects.   Do not drive, operating heavy machinery, perform activities at heights, swimming or participation in water activities or provide baby sitting services if your were admitted for syncope or siezures until you have seen by Primary MD or a Neurologist and advised to do so again.  Do not drive when taking Pain medications.    Do not take more than prescribed Pain, Sleep and Anxiety Medications  Special Instructions: If you have smoked or chewed Tobacco  in the last 2 yrs please stop smoking, stop any regular Alcohol  and or any Recreational drug use.  Wear Seat belts while driving.   Please  note  You were cared for by a hospitalist during your hospital stay. If you have any questions about your discharge medications or the care you received while you were in the hospital after you are discharged, you can call the unit and asked to speak with the hospitalist on call if the hospitalist that took care of you is not available. Once you are discharged, your primary care physician will handle any further medical issues. Please note that NO REFILLS for any discharge medications will be  authorized once you are discharged, as it is imperative that you return to your primary care physician (or establish a relationship with a primary care physician if you do not have one) for your aftercare needs so that they can reassess your need for medications and monitor your lab values.   Increase activity slowly   Complete by: As directed      Allergies as of 10/21/2020      Reactions   Lidocaine Hypertension   Mepivacaine Hypertension   Typhoid Vi Polysaccharide Vaccine Anaphylaxis, Other (See Comments)   Comatose, almost died   Amlodipine Other (See Comments)   Leg pain and fatigue   Doxycycline Other (See Comments)   Unknown reaction   Nitrofurantoin Other (See Comments), Nausea And Vomiting   Sulfa Antibiotics Hives      Medication List    STOP taking these medications   aspirin 325 MG EC tablet   naproxen sodium 220 MG tablet Commonly known as: ALEVE     TAKE these medications   apixaban 5 MG Tabs tablet Commonly known as: ELIQUIS Take 1 tablet (5 mg total) by mouth 2 (two) times daily.   atorvastatin 40 MG tablet Commonly known as: LIPITOR Take 1 tablet (40 mg total) by mouth daily. Start taking on: October 22, 2020   benzonatate 100 MG capsule Commonly known as: TESSALON Take 1-2 capsules (100-200 mg total) by mouth 3 (three) times daily as needed. What changed: reasons to take this   calcium carbonate 1250 (500 Ca) MG tablet Commonly known as: OS-CAL - dosed in mg of  elemental calcium Take 1 tablet by mouth daily with breakfast.   cholecalciferol 25 MCG (1000 UNIT) tablet Commonly known as: VITAMIN D3 Take 1,000 Units by mouth daily.   famotidine 20 MG tablet Commonly known as: PEPCID Take 1 tablet (20 mg total) by mouth 2 (two) times daily.   guaiFENesin-codeine 100-10 MG/5ML syrup Commonly known as: ROBITUSSIN AC Take 5-10 mLs by mouth 3 (three) times daily as needed for cough.   levothyroxine 25 MCG tablet Commonly known as: SYNTHROID Take 1 tablet by mouth once daily What changed: when to take this   Lutein 6 MG Tabs Take 6 mg by mouth daily at 12 noon.   metoprolol tartrate 50 MG tablet Commonly known as: LOPRESSOR Take 1 tablet (50 mg total) by mouth 2 (two) times daily.   multivitamin capsule Take 1 capsule by mouth daily.   OVER THE COUNTER MEDICATION Take 300 mg by mouth in the morning, at noon, and at bedtime. Kyolic aged garlic extract   vitamin C with rose hips 500 MG tablet Take 500 mg by mouth daily.   VITAMIN E PO Take 1 capsule by mouth daily.         Diet and Activity recommendation: See Discharge Instructions above   Consults obtained - cardiology   Major procedures and Radiology Reports - PLEASE review detailed and final reports for all details, in brief -     CT Head Wo Contrast  Result Date: 10/18/2020 CLINICAL DATA:  65 year old male with neurologic deficit. EXAM: CT HEAD WITHOUT CONTRAST TECHNIQUE: Contiguous axial images were obtained from the base of the skull through the vertex without intravenous contrast. COMPARISON:  Head CT dated 11/12/2005. FINDINGS: Brain: The ventricles and sulci appropriate size for patient's age. Mild periventricular and deep white matter chronic microvascular ischemic changes noted. There is no acute intracranial hemorrhage. No mass effect or midline shift. No extra-axial fluid collection. Vascular: No hyperdense vessel  or unexpected calcification. Skull: Normal. Negative  for fracture or focal lesion. Sinuses/Orbits: There is diffuse mucoperiosteal thickening of paranasal sinuses. Mastoid air cells are clear. Other: None IMPRESSION: 1. No acute intracranial pathology. 2. Mild chronic microvascular ischemic changes. 3. Paranasal sinus disease. Electronically Signed   By: Anner Crete M.D.   On: 10/18/2020 16:23   US RENAL  Result Date: 10/20/2020 CLINICAL DATA:  Acute kidney injury EXAM: RENAL / URINARY TRACT ULTRASOUND COMPLETE COMPARISON:  None. FINDINGS: Right Kidney: Renal measurements: 11.0 x 4.5 x 6.2 cm = volume: 159 mL. Echogenicity within normal limits. No mass or hydronephrosis visualized. Left Kidney: Renal measurements: 11.1 x 5.9 x 4.6 cm = volume: 158 mL. Echogenicity within normal limits. No mass or hydronephrosis visualized. Bladder: Appears normal for degree of bladder distention. Other: Enlarged prostate gland measuring 7.1 x 4.6 x 5.6 cm, volume 94 mL. IMPRESSION: 1. Unremarkable renal ultrasound without evidence of obstructive uropathy. 2. Prostatomegaly. Electronically Signed   By: Davina Poke D.O.   On: 10/20/2020 11:43   DG Chest Port 1 View  Result Date: 10/18/2020 CLINICAL DATA:  65 year old male with increasing shortness of breath. EXAM: PORTABLE CHEST 1 VIEW COMPARISON:  None. FINDINGS: The heart size and mediastinal contours are within normal limits. Both lungs are clear. The visualized skeletal structures are unremarkable. IMPRESSION: No acute cardiopulmonary process. Electronically Signed   By: Ruthann Cancer MD   On: 10/18/2020 13:45   ECHOCARDIOGRAM COMPLETE  Result Date: 10/19/2020    ECHOCARDIOGRAM REPORT   Patient Name:   CONO LIVENGOOD Date of Exam: 10/19/2020 Medical Rec #:  [id number]       Height:       67.0 in Accession #:    [id number]      Weight:       179.0 lb Date of Birth:  1956/04/25       BSA:          1.929 m Patient Age:    35 years        BP:           157/129 mmHg Patient Gender: M               HR:           99  bpm. Exam Location:  Inpatient Procedure: 2D Echo, Cardiac Doppler and Color Doppler Indications:    I48.0 Paroxysmal atrial fibrillation  History:        Patient has no prior history of Echocardiogram examinations.                 Risk Factors:Dyslipidemia. COVID-19 Positive.  Sonographer:    Jonelle Sidle Dance Referring Phys: [id number] Story  1. Left ventricular ejection fraction, by estimation, is 55 to 60%. The left ventricle has normal function. The left ventricle has no regional wall motion abnormalities. There is moderate left ventricular hypertrophy. Left ventricular diastolic parameters are indeterminate.  2. Right ventricular systolic function is normal. The right ventricular size is normal.  3. The mitral valve is abnormal. No evidence of mitral valve regurgitation. No evidence of mitral stenosis. Moderate mitral annular calcification.  4. The aortic valve is tricuspid. Aortic valve regurgitation is not visualized. Mild to moderate aortic valve sclerosis/calcification is present, without any evidence of aortic stenosis.  5. The inferior vena cava is normal in size with greater than 50% respiratory variability, suggesting right atrial pressure of 3 mmHg. FINDINGS  Left Ventricle: Left ventricular ejection fraction, by estimation, is  55 to 60%. The left ventricle has normal function. The left ventricle has no regional wall motion abnormalities. The left ventricular internal cavity size was normal in size. There is  moderate left ventricular hypertrophy. Left ventricular diastolic parameters are indeterminate. Right Ventricle: The right ventricular size is normal. No increase in right ventricular wall thickness. Right ventricular systolic function is normal. Left Atrium: Left atrial size was normal in size. Right Atrium: Right atrial size was normal in size. Pericardium: There is no evidence of pericardial effusion. Mitral Valve: The mitral valve is abnormal. There is mild thickening of the  mitral valve leaflet(s). There is mild calcification of the mitral valve leaflet(s). Moderate mitral annular calcification. No evidence of mitral valve regurgitation. No evidence  of mitral valve stenosis. Tricuspid Valve: The tricuspid valve is normal in structure. Tricuspid valve regurgitation is not demonstrated. No evidence of tricuspid stenosis. Aortic Valve: The aortic valve is tricuspid. Aortic valve regurgitation is not visualized. Mild to moderate aortic valve sclerosis/calcification is present, without any evidence of aortic stenosis. Pulmonic Valve: The pulmonic valve was normal in structure. Pulmonic valve regurgitation is not visualized. No evidence of pulmonic stenosis. Aorta: The aortic root is normal in size and structure. Venous: The inferior vena cava is normal in size with greater than 50% respiratory variability, suggesting right atrial pressure of 3 mmHg. IAS/Shunts: No atrial level shunt detected by color flow Doppler.  LEFT VENTRICLE PLAX 2D LVIDd:         3.60 cm LVIDs:         3.10 cm LV PW:         1.20 cm LV IVS:        1.40 cm LVOT diam:     2.20 cm LV SV:         42 LV SV Index:   22 LVOT Area:     3.80 cm  RIGHT VENTRICLE          IVC RV Basal diam:  3.10 cm  IVC diam: 1.70 cm RV Mid diam:    2.20 cm TAPSE (M-mode): 2.2 cm LEFT ATRIUM             Index       RIGHT ATRIUM           Index LA diam:        3.70 cm 1.92 cm/m  RA Area:     14.90 cm LA Vol (A2C):   51.6 ml 26.75 ml/m RA Volume:   33.40 ml  17.31 ml/m LA Vol (A4C):   29.8 ml 15.45 ml/m LA Biplane Vol: 38.6 ml 20.01 ml/m  AORTIC VALVE LVOT Vmax:   70.45 cm/s LVOT Vmean:  50.000 cm/s LVOT VTI:    0.112 m  AORTA Ao Root diam: 3.70 cm Ao Asc diam:  3.50 cm MITRAL VALVE MV Area (PHT): 2.54 cm    SHUNTS MV Decel Time: 299 msec    Systemic VTI:  0.11 m MV E velocity: 54.60 cm/s  Systemic Diam: 2.20 cm Jenkins Rouge MD Electronically signed by Jenkins Rouge MD Signature Date/Time: 10/19/2020/3:10:23 PM    Final     Micro  Results     Recent Results (from the past 240 hour(s))  Resp Panel by RT-PCR (Flu A&B, Covid) Nasopharyngeal Swab     Status: Abnormal   Collection Time: 10/18/20  1:19 PM   Specimen: Nasopharyngeal Swab; Nasopharyngeal(NP) swabs in vial transport medium  Result Value Ref Range Status   SARS Coronavirus 2 by RT  PCR POSITIVE (A) NEGATIVE Final    Comment: emailed L. Berdik RN 15:15 10/18/20 ([email or web address]) (NOTE) SARS-CoV-2 target nucleic acids are DETECTED.  The SARS-CoV-2 RNA is generally detectable in upper respiratory specimens during the acute phase of infection. Positive results are indicative of the presence of the identified virus, but do not rule out bacterial infection or co-infection with other pathogens not detected by the test. Clinical correlation with patient history and other diagnostic information is necessary to determine patient infection status. The expected result is Negative.  Fact Sheet for Patients: EntrepreneurPulse.com.au  Fact Sheet for Healthcare Providers: IncredibleEmployment.be  This test is not yet approved or cleared by the [place] FDA and  has been authorized for detection and/or diagnosis of SARS-CoV-2 by FDA under an Emergency Use Authorization (EUA).  This EUA will remain in effect (meaning this test can be used) for the duration of  the COVID-1 9 declaration under Section 564(b)(1) of the Act, 21 U.S.C. section 360bbb-3(b)(1), unless the authorization is terminated or revoked sooner.     Influenza A by PCR NEGATIVE NEGATIVE Final   Influenza B by PCR NEGATIVE NEGATIVE Final    Comment: (NOTE) The Xpert Xpress SARS-CoV-2/FLU/RSV plus assay is intended as an aid in the diagnosis of influenza from Nasopharyngeal swab specimens and should not be used as a sole basis for treatment. Nasal washings and aspirates are unacceptable for Xpert Xpress SARS-CoV-2/FLU/RSV testing.  Fact Sheet for  Patients: EntrepreneurPulse.com.au  Fact Sheet for Healthcare Providers: IncredibleEmployment.be  This test is not yet approved or cleared by the [place] FDA and has been authorized for detection and/or diagnosis of SARS-CoV-2 by FDA under an Emergency Use Authorization (EUA). This EUA will remain in effect (meaning this test can be used) for the duration of the COVID-19 declaration under Section 564(b)(1) of the Act, 21 U.S.C. section 360bbb-3(b)(1), unless the authorization is terminated or revoked.  Performed at [facility], [place] [address] [place], [place] [address]   MRSA PCR Screening     Status: None   Collection Time: 10/19/20 11:39 PM   Specimen: Nasopharyngeal  Result Value Ref Range Status   MRSA by PCR NEGATIVE NEGATIVE Final    Comment:        The GeneXpert MRSA Assay (FDA approved for NASAL specimens only), is one component of a comprehensive MRSA colonization surveillance program. It is not intended to diagnose MRSA infection nor to guide or monitor treatment for MRSA infections. Performed at [facility], [place] [address], [place], [place] [address]        Today   Subjective:   Ozzie Hino today has no headache,no chest OR abdominal pain,no new weakness tingling or numbness, feels much better wants to go home today.  Objective:   Blood pressure (!) 104/93, pulse 81, temperature 97.7 F (36.5 C), temperature source Oral, resp. rate 19, height 5\' 7"  (1.702 m), weight 83.2 kg, SpO2 91 %.   Intake/Output Summary (Last 24 hours) at 10/21/2020 1016 Last data filed at 10/21/2020 0800 Gross per 24 hour  Intake 2208.52 ml  Output 1500 ml  Net 708.52 ml    Exam Awake Alert, Oriented x 3, No new F.N deficits, Normal affect Symmetrical Chest wall movement, Good air movement bilaterally, CTAB IRR IRR ,No Gallops,Rubs or new Murmurs, No Parasternal Heave +ve B.Sounds, Abd Soft, Non tender,No rebound  -guarding or rigidity. No Cyanosis, Clubbing or edema, No new Rash or bruise  Data Review   CBC w Diff:  Lab  Results  Component Value Date   WBC 9.7 10/21/2020   HGB 15.2 10/21/2020   HCT 45.8 10/21/2020   PLT 253 10/21/2020   LYMPHOPCT 31 10/21/2020   MONOPCT 6 10/21/2020   EOSPCT 4 10/21/2020   BASOPCT 0 10/21/2020    CMP:  Lab Results  Component Value Date   NA 142 10/21/2020   K 4.3 10/21/2020   CL 106 10/21/2020   CO2 27 10/21/2020   BUN 24 (H) 10/21/2020   CREATININE 1.25 (H) 10/21/2020   PROT 5.9 (L) 10/21/2020   ALBUMIN 3.4 (L) 10/21/2020   BILITOT 0.7 10/21/2020   ALKPHOS 51 10/21/2020   AST 19 10/21/2020   ALT 28 10/21/2020  .   Total Time in preparing paper work, data evaluation and todays exam - 33 minutes  Phillips Climes M.D on 10/21/2020 at 10:16 AM  [facility]   Office  [phone]

## 2020-10-21 NOTE — Progress Notes (Signed)
   10/20/20 2311  Assess: MEWS Score  Temp 97.7 F (36.5 C)  BP (!) 108/97  Pulse Rate 82  ECG Heart Rate (!) 118  Resp 20  Level of Consciousness Alert  SpO2 95 %  O2 Device Room Air  Patient Activity (if Appropriate) In bed  Assess: MEWS Score  MEWS Temp 0  MEWS Systolic 0  MEWS Pulse 2  MEWS RR 0  MEWS LOC 0  MEWS Score 2  MEWS Score Color Yellow  Assess: if the MEWS score is Yellow or Red  Were vital signs taken at a resting state? Yes  Focused Assessment No change from prior assessment  Early Detection of Sepsis Score *See Row Information* Low  MEWS guidelines implemented *See Row Information* Yes  Treat  MEWS Interventions Escalated (See documentation below)  Pain Scale 0-10  Pain Score 0  Take Vital Signs  Increase Vital Sign Frequency  Yellow: Q 2hr X 2 then Q 4hr X 2, if remains yellow, continue Q 4hrs  Escalate  MEWS: Escalate Yellow: discuss with charge nurse/RN and consider discussing with provider and RRT  Notify: Charge Nurse/RN  Name of Charge Nurse/RN Notified Marissa, RN  Date Charge Nurse/RN Notified 10/20/20  Time Charge Nurse/RN Notified 2311  Document  Patient Outcome Other (Comment) (stable)  Progress note created (see row info) Yes

## 2020-10-21 NOTE — Progress Notes (Signed)
    Pt is ok for DC today  Cont Eliquis 5 PO BID  - its important that he does not miss any doses Cont metoprolol 50 BID for rate control.    He has an appt already scheduled with [place] - Feb. 11    Mertie Moores, MD  10/21/2020 8:50 AM    [facility] [place],  [place], [place]  [address] Phone: [phone]; Fax: [phone]

## 2020-10-21 NOTE — Plan of Care (Signed)

## 2020-10-21 NOTE — Progress Notes (Signed)
Discharge instructions given to patient who verbalized discharge instructions and follow up appointment with cardiology. IV was removed without complication, clean, dry and intact. Patients daughter will be picking patient up at the [place] [facility] entrance.

## 2020-10-21 NOTE — Progress Notes (Signed)
F/u appt scheduled 2/11, placed on AVS.

## 2020-10-24 ENCOUNTER — Telehealth: Payer: Self-pay | Admitting: Cardiovascular Disease

## 2020-10-24 NOTE — Telephone Encounter (Signed)
Patient has not missed any new medications from the hospital. (Eliquis, Lipitor, Metoprolol tartrate)  5 am BP 109/88 HR 71 10 am 112/65 HR 77. Took morning medications at 10:30 am. Current BP 114/86 HR 62.  Advised about care for iv infiltration.  Educated about Atrial Fibrillation and to avoid alcohol and caffeine as these can be triggers.  Appointment 10/28/20.   Vitals stable gave ED precautions. Verbalized understanding.  > 40 min call.

## 2020-10-24 NOTE — Telephone Encounter (Signed)
Pt called in stated he wants to see a MD only.  He does want to see a APP.  He would like to see Dr Marlou Porch or Dr Radford Pax .  He is in afib and the med they gave himin the hosp is not working.    Best number [phone]

## 2020-10-25 NOTE — Telephone Encounter (Signed)
Please remind him that the metoprolol is to keep the HR slow - it will not convert him to normal sinus rhythm AND we do not want to convert him back to normal rhythm yet - until he has had at least 3 weeks of uninterrupted anticoagulation.  He needs to continue the Eliquis 5 mg PO BID.   He stated that he wants to see Dr. Marlou Porch or Radford Pax.   That is fine with me if he wants to switch.   You can also work in him into my schedule on Feb. 15th, 16th or 18.  The VS he provided are normal and indicate that his HR and BP are good.

## 2020-10-25 NOTE — Telephone Encounter (Signed)
Please have this patient triaged and set up for an appt in our office later this week.  He really needs to CALL not message the cardiologist fo advice/ appt given they are the ones who will titrate the meds for afib.  I sent it to you as I am not sure who is triaging today.

## 2020-10-25 NOTE — Telephone Encounter (Signed)
Lvm for patient to call back to r/s 1/20 appointment. It looks like it has been cancelled and unsure of the reason why.

## 2020-10-25 NOTE — Telephone Encounter (Signed)
See also another pt message fron 10/24/20. I called pt and no answer but left v/m requesting pt to call [facility] to ck on pts lt arm being red, swollen and painful. Please see the other pt message note pt already has appt with Dr Acie Fredrickson cardiologist on 10/28/20 and an appt with Dr Diona Browner on 10/27/20. The appt with Dr Diona Browner per appt notes was cancelled [facility] for 10/27/20 by Fabio Asa on 10/10/20. Not sure why cancelled. Per Dr Rometta Emery note pt needs appt scheduled later this wk for HFU. And I am not sure if will need TOC with Dr Diona Browner also. Will send to [facility] front office mgr. For scheduling. Will send this note to myself, Tresa Moore, Butch Penny CMA,Dr Diona Browner and Donzetta Matters front office mgr.

## 2020-10-27 ENCOUNTER — Ambulatory Visit (INDEPENDENT_AMBULATORY_CARE_PROVIDER_SITE_OTHER): Payer: Medicare Other | Admitting: Family Medicine

## 2020-10-27 ENCOUNTER — Other Ambulatory Visit: Payer: Self-pay

## 2020-10-27 ENCOUNTER — Encounter: Payer: Medicare Other | Admitting: Family Medicine

## 2020-10-27 ENCOUNTER — Encounter: Payer: Self-pay | Admitting: Family Medicine

## 2020-10-27 VITALS — BP 100/74 | HR 70 | Temp 98.0°F | Ht 66.0 in | Wt 180.2 lb

## 2020-10-27 DIAGNOSIS — I5032 Chronic diastolic (congestive) heart failure: Secondary | ICD-10-CM | POA: Insufficient documentation

## 2020-10-27 DIAGNOSIS — Z8673 Personal history of transient ischemic attack (TIA), and cerebral infarction without residual deficits: Secondary | ICD-10-CM | POA: Insufficient documentation

## 2020-10-27 DIAGNOSIS — R972 Elevated prostate specific antigen [PSA]: Secondary | ICD-10-CM

## 2020-10-27 DIAGNOSIS — U071 COVID-19: Secondary | ICD-10-CM | POA: Diagnosis not present

## 2020-10-27 DIAGNOSIS — I10 Essential (primary) hypertension: Secondary | ICD-10-CM | POA: Diagnosis not present

## 2020-10-27 DIAGNOSIS — I4891 Unspecified atrial fibrillation: Secondary | ICD-10-CM | POA: Diagnosis not present

## 2020-10-27 DIAGNOSIS — Z125 Encounter for screening for malignant neoplasm of prostate: Secondary | ICD-10-CM | POA: Diagnosis not present

## 2020-10-27 DIAGNOSIS — N411 Chronic prostatitis: Secondary | ICD-10-CM

## 2020-10-27 DIAGNOSIS — R339 Retention of urine, unspecified: Secondary | ICD-10-CM

## 2020-10-27 DIAGNOSIS — E039 Hypothyroidism, unspecified: Secondary | ICD-10-CM

## 2020-10-27 DIAGNOSIS — L03114 Cellulitis of left upper limb: Secondary | ICD-10-CM

## 2020-10-27 MED ORDER — CEPHALEXIN 500 MG PO CAPS: 500.0000 mg | ORAL_CAPSULE | Freq: Three times a day (TID) | ORAL | 0 refills | Status: DC

## 2020-10-27 NOTE — Patient Instructions (Addendum)
Keep appt with cardiology.  Start antibitoics.Marland Kitchen keflex three times daily x 7 days.  Consider making follow up with Dr. Bernardo Heater for prostate issues.  Call to make follow up next Tuesday.  Call if not tolerating antibiotics or redness spreading pain increasing in arm.

## 2020-10-27 NOTE — Progress Notes (Signed)
Patient ID: TAFF LEET, male    DOB: 1956-02-09, 65 y.o.   MRN: [id number]  This visit was conducted in person.  Pulse 70   Temp 98 F (36.7 C) (Temporal)   Ht 5\' 6"  (1.676 m)   Wt 180 lb 4 oz (81.8 kg)   SpO2 97%   BMI 29.09 kg/m    CC:  Chief Complaint  Patient presents with  . Establish Care    Transfer from D. Gessner    Subjective:   HPI: MIGEL CLAFFEY is a 65 y.o. male presenting on 10/27/2020 for Establish Care (Transfer from D. Carlean Purl)  Recent hospital admission on 10/18/2020 to 10/21/2020 for acute COVID infection and atrial fibrillation diagnosis after he presented with new onset SOB an palpitations.  Hospital Course copied for informational purposes 1. Acute COVID-19 infection -currently with no oxygen requirement, on room air, stable inflammatory markers, this likely is incidental finding. He has taken 1 shot of J&J vaccine in the past. He does not need any  specific treatment for now.  2. New onset A. fib RVR with some shortness of breath due to acute on chronic diastolic CHF. CHA2DS2-VASc score is 38 (age, TIA, hypertension)initially on Cardizem drip, now on oral Lopressor and Eliquis, TSH &echocardiogram stable. CHF has resolved. Appreciate cardiology input.  3. History of TIA. Aspirin has been switched to Eliquis.  4. Hypothyroidism. On Synthroid. Stable TSH.  5.AKI. Mild likely due to dehydration and hypotension  6. Dyslipidemia. Placed on statin.  New meds:  50mg  Metoprolol tab 2X per day,  5mg  Eliquis 2X per day, and 40mg  Avorvastatin Calcium  (Lipitor) 1X per day ( Has not been taking because took once and it made him have diarrhea, muscle pain, etc. ).    Today he reports he feels some better but not much. He has no fever, no cough.   He feels he is still in afib... cannot tolerate the metoprolol he was started on.. causing fatigue. His heart rate has been elevated and irregular.. watch states irregular, HR 88-102, BP in  last day 90/60s Appt with Dr Acie Fredrickson cardiologist on 10/28/20  Metoprolol makes him feel lightheaded.. BP is low.  Needs cbc, CMET to re-eval..   Noted swelling and soreness in left arm at IV site.. started  1 day after placed.  Noted redness at site and swelling, firmness and increased pain in last  Few days after IV removed.  No fever. Pain radiates to left shoulder and to hand.. some numbness and tingling in left had.     Last CPX 2020   Hx of chronic prostatitis and elevated PSA.Clarnce Flock urologist in past.  Dr.Stoioff 2019   Dx with BPH in hospital on [place]... weak stream, dribbling BP dropped with some prostate med.. not sure which.   Hx of hypertension..  Was on amlodipine... years ago, not sure why... had leg pain SE. BP Readings from Last 3 Encounters:  10/27/20 100/74  10/21/20 (!) 104/93  09/07/20 (!) 168/101     Hx of TIA:   Prior marine.. very active with running.  He is adopted.    Lab Results  Component Value Date   PSA1 4.9 (H) 07/29/2018   PSA 4.97 (H) 08/31/2019   PSA 5.18 (H) 05/02/2018      Relevant past medical, surgical, family and social history reviewed and updated as indicated. Interim medical history since our last visit reviewed. Allergies and medications reviewed and updated. Outpatient Medications Prior to Visit  Medication Sig Dispense Refill  . apixaban (ELIQUIS) 5 MG TABS tablet Take 1 tablet (5 mg total) by mouth 2 (two) times daily. 60 tablet 0  . atorvastatin (LIPITOR) 40 MG tablet Take 1 tablet (40 mg total) by mouth daily. 30 tablet 0  . benzonatate (TESSALON) 100 MG capsule Take 1-2 capsules (100-200 mg total) by mouth 3 (three) times daily as needed. 30 capsule 0  . calcium carbonate (OS-CAL - DOSED IN MG OF ELEMENTAL CALCIUM) 1250 (500 Ca) MG tablet Take 1 tablet by mouth daily with breakfast.    . cholecalciferol (VITAMIN D3) 25 MCG (1000 UNIT) tablet Take 1,000 Units by mouth daily.    . famotidine (PEPCID) 20 MG tablet Take 1 tablet  (20 mg total) by mouth 2 (two) times daily. 60 tablet 2  . levothyroxine (SYNTHROID) 25 MCG tablet Take 1 tablet by mouth once daily 90 tablet 0  . Lutein 6 MG TABS Take 6 mg by mouth daily at 12 noon.    . metoprolol tartrate (LOPRESSOR) 50 MG tablet Take 1 tablet (50 mg total) by mouth 2 (two) times daily. 60 tablet 0  . Multiple Vitamin (MULTIVITAMIN) capsule Take 1 capsule by mouth daily.    Marland Kitchen OVER THE COUNTER MEDICATION Take 300 mg by mouth in the morning, at noon, and at bedtime. Kyolic aged garlic extract    . VITAMIN E PO Take 1 capsule by mouth daily.    . Ascorbic Acid (VITAMIN C WITH ROSE HIPS) 500 MG tablet Take 500 mg by mouth daily.    Marland Kitchen guaiFENesin-codeine (ROBITUSSIN AC) 100-10 MG/5ML syrup Take 5-10 mLs by mouth 3 (three) times daily as needed for cough. 120 mL 0   No facility-administered medications prior to visit.     Per HPI unless specifically indicated in ROS section below Review of Systems  Constitutional: Positive for fatigue. Negative for fever.  HENT: Negative for ear pain.   Eyes: Negative for pain.  Respiratory: Negative for cough and shortness of breath.   Cardiovascular: Negative for chest pain, palpitations and leg swelling.  Gastrointestinal: Negative for abdominal pain.  Genitourinary: Negative for dysuria.  Musculoskeletal: Negative for arthralgias.  Neurological: Negative for syncope, light-headedness and headaches.  Psychiatric/Behavioral: Negative for dysphoric mood.   Objective:  Pulse 70   Temp 98 F (36.7 C) (Temporal)   Ht 5\' 6"  (1.676 m)   Wt 180 lb 4 oz (81.8 kg)   SpO2 97%   BMI 29.09 kg/m   Wt Readings from Last 3 Encounters:  10/27/20 180 lb 4 oz (81.8 kg)  10/19/20 183 lb 6.8 oz (83.2 kg)  09/07/20 180 lb (81.6 kg)       BP Readings from Last 3 Encounters:  10/27/20 100/74  10/21/20 (!) 104/93  09/07/20 (!) 168/101    Physical Exam Constitutional:      General: Vital signs are normal.     Appearance: He is  well-developed and well-nourished.  HENT:     Head: Normocephalic.     Right Ear: Hearing normal.     Left Ear: Hearing normal.     Nose: Nose normal.     Mouth/Throat:     Mouth: Oropharynx is clear and moist and mucous membranes are normal.  Neck:     Thyroid: No thyroid mass or thyromegaly.     Vascular: No carotid bruit.     Trachea: Trachea normal.  Cardiovascular:     Rate and Rhythm: Normal rate. Rhythm irregularly irregular.  Pulses: Normal pulses.     Heart sounds: Heart sounds not distant. No murmur heard. No friction rub. No gallop.      Comments: No peripheral edema Pulmonary:     Effort: Pulmonary effort is normal. No respiratory distress.     Breath sounds: Normal breath sounds.  Skin:    General: Skin is warm, dry and intact.     Findings: No rash.          Comments: Erythema at site of IV, firmness and pain extending above antecubital fossa  7 cm  Psychiatric:        Mood and Affect: Mood and affect normal.        Speech: Speech normal.        Behavior: Behavior normal.        Thought Content: Thought content normal.       Results for orders placed or performed during the hospital encounter of 10/18/20  Resp Panel by RT-PCR (Flu A&B, Covid) Nasopharyngeal Swab   Specimen: Nasopharyngeal Swab; Nasopharyngeal(NP) swabs in vial transport medium  Result Value Ref Range   SARS Coronavirus 2 by RT PCR POSITIVE (A) NEGATIVE   Influenza A by PCR NEGATIVE NEGATIVE   Influenza B by PCR NEGATIVE NEGATIVE  MRSA PCR Screening   Specimen: Nasopharyngeal  Result Value Ref Range   MRSA by PCR NEGATIVE NEGATIVE  Basic metabolic panel  Result Value Ref Range   Sodium 138 135 - 145 mmol/L   Potassium 4.1 3.5 - 5.1 mmol/L   Chloride 104 98 - 111 mmol/L   CO2 22 22 - 32 mmol/L   Glucose, Bld 129 (H) 70 - 99 mg/dL   BUN 24 (H) 8 - 23 mg/dL   Creatinine, Ser 1.24 0.61 - 1.24 mg/dL   Calcium 9.4 8.9 - 10.3 mg/dL   GFR, Estimated >60 >60 mL/min   Anion gap 12 5 - 15   CBC  Result Value Ref Range   WBC 6.1 4.0 - 10.5 K/uL   RBC 5.65 4.22 - 5.81 MIL/uL   Hemoglobin 16.2 13.0 - 17.0 g/dL   HCT 50.8 39.0 - 52.0 %   MCV 89.9 80.0 - 100.0 fL   MCH 28.7 26.0 - 34.0 pg   MCHC 31.9 30.0 - 36.0 g/dL   RDW 12.0 11.5 - 15.5 %   Platelets 219 150 - 400 K/uL   nRBC 0.0 0.0 - 0.2 %  Urinalysis, Routine w reflex microscopic  Result Value Ref Range   Color, Urine YELLOW YELLOW   APPearance CLEAR CLEAR   Specific Gravity, Urine 1.018 1.005 - 1.030   pH 6.0 5.0 - 8.0   Glucose, UA NEGATIVE NEGATIVE mg/dL   Hgb urine dipstick NEGATIVE NEGATIVE   Bilirubin Urine NEGATIVE NEGATIVE   Ketones, ur NEGATIVE NEGATIVE mg/dL   Protein, ur NEGATIVE NEGATIVE mg/dL   Nitrite NEGATIVE NEGATIVE   Leukocytes,Ua NEGATIVE NEGATIVE  TSH  Result Value Ref Range   TSH 5.431 (H) 0.350 - 4.500 uIU/mL  HIV Antibody (routine testing w rflx)  Result Value Ref Range   HIV Screen 4th Generation wRfx Non Reactive Non Reactive  TSH  Result Value Ref Range   TSH 3.387 0.350 - 4.500 uIU/mL  T4, free  Result Value Ref Range   Free T4 0.88 0.61 - 1.12 ng/dL  Lipid panel  Result Value Ref Range   Cholesterol 226 (H) 0 - 200 mg/dL   Triglycerides 293 (H) <150 mg/dL   HDL 36 (L) >40 mg/dL  Total CHOL/HDL Ratio 6.3 RATIO   VLDL 59 (H) 0 - 40 mg/dL   LDL Cholesterol 131 (H) 0 - 99 mg/dL  D-dimer, quantitative (not at [facility])  Result Value Ref Range   D-Dimer, Quant 0.54 (H) 0.00 - 0.50 ug/mL-FEU  C-reactive protein  Result Value Ref Range   CRP <0.5 <1.0 mg/dL  Brain natriuretic peptide  Result Value Ref Range   B Natriuretic Peptide 268.3 (H) 0.0 - 100.0 pg/mL  Ferritin  Result Value Ref Range   Ferritin 218 24 - 336 ng/mL  Fibrinogen  Result Value Ref Range   Fibrinogen 415 210 - 475 mg/dL  Hepatitis B surface antigen  Result Value Ref Range   Hepatitis B Surface Ag NON REACTIVE NON REACTIVE  Lactate dehydrogenase  Result Value Ref Range   LDH 160 98 - 192 U/L   Procalcitonin  Result Value Ref Range   Procalcitonin <0.10 ng/mL  CBC with Differential/Platelet  Result Value Ref Range   WBC 7.8 4.0 - 10.5 K/uL   RBC 5.26 4.22 - 5.81 MIL/uL   Hemoglobin 15.5 13.0 - 17.0 g/dL   HCT 46.6 39.0 - 52.0 %   MCV 88.6 80.0 - 100.0 fL   MCH 29.5 26.0 - 34.0 pg   MCHC 33.3 30.0 - 36.0 g/dL   RDW 12.6 11.5 - 15.5 %   Platelets 229 150 - 400 K/uL   nRBC 0.0 0.0 - 0.2 %   Neutrophils Relative % 73 %   Neutro Abs 5.6 1.7 - 7.7 K/uL   Lymphocytes Relative 21 %   Lymphs Abs 1.6 0.7 - 4.0 K/uL   Monocytes Relative 5 %   Monocytes Absolute 0.4 0.1 - 1.0 K/uL   Eosinophils Relative 1 %   Eosinophils Absolute 0.1 0.0 - 0.5 K/uL   Basophils Relative 0 %   Basophils Absolute 0.0 0.0 - 0.1 K/uL   Immature Granulocytes 0 %   Abs Immature Granulocytes 0.03 0.00 - 0.07 K/uL  Comprehensive metabolic panel  Result Value Ref Range   Sodium 137 135 - 145 mmol/L   Potassium 4.6 3.5 - 5.1 mmol/L   Chloride 102 98 - 111 mmol/L   CO2 25 22 - 32 mmol/L   Glucose, Bld 142 (H) 70 - 99 mg/dL   BUN 23 8 - 23 mg/dL   Creatinine, Ser 1.22 0.61 - 1.24 mg/dL   Calcium 9.3 8.9 - 10.3 mg/dL   Total Protein 6.4 (L) 6.5 - 8.1 g/dL   Albumin 3.6 3.5 - 5.0 g/dL   AST 21 15 - 41 U/L   ALT 33 0 - 44 U/L   Alkaline Phosphatase 59 38 - 126 U/L   Total Bilirubin 0.7 0.3 - 1.2 mg/dL   GFR, Estimated >60 >60 mL/min   Anion gap 10 5 - 15  C-reactive protein  Result Value Ref Range   CRP <0.5 <1.0 mg/dL  D-dimer, quantitative (not at [facility])  Result Value Ref Range   D-Dimer, Quant 0.43 0.00 - 0.50 ug/mL-FEU  Ferritin  Result Value Ref Range   Ferritin 203 24 - 336 ng/mL  Magnesium  Result Value Ref Range   Magnesium 2.3 1.7 - 2.4 mg/dL  Phosphorus  Result Value Ref Range   Phosphorus 3.7 2.5 - 4.6 mg/dL  Hemoglobin A1c  Result Value Ref Range   Hgb A1c MFr Bld 6.0 (H) 4.8 - 5.6 %   Mean Plasma Glucose 125.5 mg/dL  Brain natriuretic peptide  Result Value Ref Range   B  Natriuretic Peptide 251.7 (H) 0.0 - 100.0 pg/mL  CBC with Differential/Platelet  Result Value Ref Range   WBC 7.4 4.0 - 10.5 K/uL   RBC 5.22 4.22 - 5.81 MIL/uL   Hemoglobin 15.0 13.0 - 17.0 g/dL   HCT 47.1 39.0 - 52.0 %   MCV 90.2 80.0 - 100.0 fL   MCH 28.7 26.0 - 34.0 pg   MCHC 31.8 30.0 - 36.0 g/dL   RDW 12.3 11.5 - 15.5 %   Platelets 223 150 - 400 K/uL   nRBC 0.0 0.0 - 0.2 %   Neutrophils Relative % 48 %   Neutro Abs 3.5 1.7 - 7.7 K/uL   Lymphocytes Relative 39 %   Lymphs Abs 2.9 0.7 - 4.0 K/uL   Monocytes Relative 8 %   Monocytes Absolute 0.6 0.1 - 1.0 K/uL   Eosinophils Relative 5 %   Eosinophils Absolute 0.4 0.0 - 0.5 K/uL   Basophils Relative 0 %   Basophils Absolute 0.0 0.0 - 0.1 K/uL   Immature Granulocytes 0 %   Abs Immature Granulocytes 0.01 0.00 - 0.07 K/uL  Comprehensive metabolic panel  Result Value Ref Range   Sodium 143 135 - 145 mmol/L   Potassium 4.2 3.5 - 5.1 mmol/L   Chloride 105 98 - 111 mmol/L   CO2 29 22 - 32 mmol/L   Glucose, Bld 145 (H) 70 - 99 mg/dL   BUN 28 (H) 8 - 23 mg/dL   Creatinine, Ser 1.48 (H) 0.61 - 1.24 mg/dL   Calcium 8.9 8.9 - 10.3 mg/dL   Total Protein 6.3 (L) 6.5 - 8.1 g/dL   Albumin 3.5 3.5 - 5.0 g/dL   AST 18 15 - 41 U/L   ALT 28 0 - 44 U/L   Alkaline Phosphatase 53 38 - 126 U/L   Total Bilirubin 0.7 0.3 - 1.2 mg/dL   GFR, Estimated 52 (L) >60 mL/min   Anion gap 9 5 - 15  C-reactive protein  Result Value Ref Range   CRP 0.7 <1.0 mg/dL  D-dimer, quantitative (not at [facility])  Result Value Ref Range   D-Dimer, Quant 0.27 0.00 - 0.50 ug/mL-FEU  Magnesium  Result Value Ref Range   Magnesium 2.1 1.7 - 2.4 mg/dL  Brain natriuretic peptide  Result Value Ref Range   B Natriuretic Peptide 226.8 (H) 0.0 - 100.0 pg/mL  Urinalysis, Routine w reflex microscopic Urine, Clean Catch  Result Value Ref Range   Color, Urine YELLOW YELLOW   APPearance CLEAR CLEAR   Specific Gravity, Urine 1.010 1.005 - 1.030   pH 7.0 5.0 - 8.0   Glucose,  UA NEGATIVE NEGATIVE mg/dL   Hgb urine dipstick NEGATIVE NEGATIVE   Bilirubin Urine NEGATIVE NEGATIVE   Ketones, ur NEGATIVE NEGATIVE mg/dL   Protein, ur NEGATIVE NEGATIVE mg/dL   Nitrite NEGATIVE NEGATIVE   Leukocytes,Ua NEGATIVE NEGATIVE  CBC with Differential/Platelet  Result Value Ref Range   WBC 9.7 4.0 - 10.5 K/uL   RBC 5.13 4.22 - 5.81 MIL/uL   Hemoglobin 15.2 13.0 - 17.0 g/dL   HCT 45.8 39.0 - 52.0 %   MCV 89.3 80.0 - 100.0 fL   MCH 29.6 26.0 - 34.0 pg   MCHC 33.2 30.0 - 36.0 g/dL   RDW 12.4 11.5 - 15.5 %   Platelets 253 150 - 400 K/uL   nRBC 0.0 0.0 - 0.2 %   Neutrophils Relative % 59 %   Neutro Abs 5.6 1.7 - 7.7 K/uL   Lymphocytes Relative 31 %  Lymphs Abs 3.0 0.7 - 4.0 K/uL   Monocytes Relative 6 %   Monocytes Absolute 0.6 0.1 - 1.0 K/uL   Eosinophils Relative 4 %   Eosinophils Absolute 0.4 0.0 - 0.5 K/uL   Basophils Relative 0 %   Basophils Absolute 0.0 0.0 - 0.1 K/uL   Immature Granulocytes 0 %   Abs Immature Granulocytes 0.03 0.00 - 0.07 K/uL  Comprehensive metabolic panel  Result Value Ref Range   Sodium 142 135 - 145 mmol/L   Potassium 4.3 3.5 - 5.1 mmol/L   Chloride 106 98 - 111 mmol/L   CO2 27 22 - 32 mmol/L   Glucose, Bld 141 (H) 70 - 99 mg/dL   BUN 24 (H) 8 - 23 mg/dL   Creatinine, Ser 1.25 (H) 0.61 - 1.24 mg/dL   Calcium 9.7 8.9 - 10.3 mg/dL   Total Protein 5.9 (L) 6.5 - 8.1 g/dL   Albumin 3.4 (L) 3.5 - 5.0 g/dL   AST 19 15 - 41 U/L   ALT 28 0 - 44 U/L   Alkaline Phosphatase 51 38 - 126 U/L   Total Bilirubin 0.7 0.3 - 1.2 mg/dL   GFR, Estimated >60 >60 mL/min   Anion gap 9 5 - 15  C-reactive protein  Result Value Ref Range   CRP 1.0 (H) <1.0 mg/dL  D-dimer, quantitative (not at [facility])  Result Value Ref Range   D-Dimer, Quant <0.27 0.00 - 0.50 ug/mL-FEU  Magnesium  Result Value Ref Range   Magnesium 2.1 1.7 - 2.4 mg/dL  Brain natriuretic peptide  Result Value Ref Range   B Natriuretic Peptide 215.6 (H) 0.0 - 100.0 pg/mL  ECHOCARDIOGRAM  COMPLETE  Result Value Ref Range   Weight 2,864 oz   Height 67 in   BP 114/89 mmHg   S' Lateral 3.10 cm   Area-P 1/2 2.54 cm2  Type and screen [place]  Result Value Ref Range   ABO/RH(D) A POS    Antibody Screen NEG    Sample Expiration      10/21/2020,2359 Performed at [facility], [address] [place], [place] [address]   ABO/Rh  Result Value Ref Range   ABO/RH(D)      A POS Performed at [facility] [address], [place], [place] [address]   Troponin I (High Sensitivity)  Result Value Ref Range   Troponin I (High Sensitivity) 8 <18 ng/L  Troponin I (High Sensitivity)  Result Value Ref Range   Troponin I (High Sensitivity) 8 <18 ng/L  Troponin I (High Sensitivity)  Result Value Ref Range   Troponin I (High Sensitivity) 9 <18 ng/L    This visit occurred during the SARS-CoV-2 public health emergency.  Safety protocols were in place, including screening questions prior to the visit, additional usage of staff PPE, and extensive cleaning of exam room while observing appropriate contact time as indicated for disinfecting solutions.   COVID 19 screen:  No recent travel or known exposure to COVID19 The patient denies respiratory symptoms of COVID 19 at this time. The importance of social distancing was discussed today.   Assessment and Plan COVID-19 Resolved infection.  Atrial fibrillation with RVR ([place]) Followed by Cardiology. Initially on Cardizem drip, now on oral Lopressor and Eliquis, TSH &echocardiogram stable.   History of TIAs Aspirin has been switched to Eliquis.  Hypothyroidism Stable, chronic.  Continue current medication.   Synthroid 25 mcg daily  Cellulitis of left upper extremity Start antibitoics given  possible IV associate infection/infected hematoma/ phlebitis.  Apply Heat and can use tyelnol for pain.  Follow up closely.  Elevated prostate specific antigen (PSA)  Due for re-eval.   Incomplete emptying  of bladder Dx with BPH in hospital. SE to ? Unknown med in past.   Consider making follow up with Dr. Bernardo Heater for prostate issues.       Eliezer Lofts, MD

## 2020-10-28 ENCOUNTER — Encounter: Payer: Self-pay | Admitting: Cardiovascular Disease

## 2020-10-28 ENCOUNTER — Encounter: Payer: Self-pay | Admitting: *Deleted

## 2020-10-28 ENCOUNTER — Ambulatory Visit (INDEPENDENT_AMBULATORY_CARE_PROVIDER_SITE_OTHER): Payer: Medicare Other | Admitting: Cardiovascular Disease

## 2020-10-28 VITALS — BP 90/62 | HR 64 | Ht 66.0 in | Wt 182.0 lb

## 2020-10-28 DIAGNOSIS — I4891 Unspecified atrial fibrillation: Secondary | ICD-10-CM | POA: Diagnosis not present

## 2020-10-28 DIAGNOSIS — Z01812 Encounter for preprocedural laboratory examination: Secondary | ICD-10-CM

## 2020-10-28 LAB — COMPREHENSIVE METABOLIC PANEL
ALT: 53 U/L (ref 0–53)
AST: 27 U/L (ref 0–37)
Albumin: 4.2 g/dL (ref 3.5–5.2)
Alkaline Phosphatase: 94 U/L (ref 39–117)
BUN: 33 mg/dL — ABNORMAL HIGH (ref 6–23)
CO2: 26 mEq/L (ref 19–32)
Calcium: 9.3 mg/dL (ref 8.4–10.5)
Chloride: 105 mEq/L (ref 96–112)
Creatinine, Ser: 1.39 mg/dL (ref 0.40–1.50)
GFR: 52.75 mL/min — ABNORMAL LOW (ref >60.00)
Glucose, Bld: 104 mg/dL — ABNORMAL HIGH (ref 70–99)
Potassium: 4.9 mEq/L (ref 3.5–5.1)
Sodium: 139 mEq/L (ref 135–145)
Total Bilirubin: 0.6 mg/dL (ref 0.2–1.2)
Total Protein: 6.7 g/dL (ref 6.0–8.3)

## 2020-10-28 LAB — CBC WITH DIFFERENTIAL/PLATELET
Basophils Absolute: 0.2 10*3/uL — ABNORMAL HIGH (ref 0.0–0.1)
Basophils Relative: 1.6 % (ref 0.0–3.0)
Eosinophils Absolute: 0.3 10*3/uL (ref 0.0–0.7)
Eosinophils Relative: 3 % (ref 0.0–5.0)
HCT: 42.5 % (ref 39.0–52.0)
Hemoglobin: 14 g/dL (ref 13.0–17.0)
Lymphocytes Relative: 21 % (ref 12.0–46.0)
Lymphs Abs: 2 10*3/uL (ref 0.7–4.0)
MCHC: 32.9 g/dL (ref 30.0–36.0)
MCV: 88.3 fl (ref 78.0–100.0)
Monocytes Absolute: 0.9 10*3/uL (ref 0.1–1.0)
Monocytes Relative: 9.6 % (ref 3.0–12.0)
Neutro Abs: 6.2 10*3/uL (ref 1.4–7.7)
Neutrophils Relative %: 64.8 % (ref 43.0–77.0)
Platelets: 303 10*3/uL (ref 150.0–400.0)
RBC: 4.81 Mil/uL (ref 4.22–5.81)
RDW: 12.6 % (ref 11.5–15.5)
WBC: 9.6 10*3/uL (ref 4.0–10.5)

## 2020-10-28 MED ORDER — METOPROLOL TARTRATE 25 MG PO TABS: 25.0000 mg | ORAL_TABLET | Freq: Two times a day (BID) | ORAL | 3 refills | Status: DC

## 2020-10-28 NOTE — Progress Notes (Signed)
Cardiology Office Note:    Date:  10/28/2020   ID:  KYLIL Huffman, DOB [birth year], MRN [id number]  PCP:  Jinny Sanders, MD  [facility] Cardiologist:  Mertie Moores, MD  [place] Electrophysiologist:  None   Referring MD: Jinny Sanders, MD   Chief Complaint  Patient presents with  . Atrial Fibrillation     Jan. 21, 2022   Alex Huffman is a 65 y.o. male with a hx of recent diagnosis of atrial fib and COVID  Has superficial thrombophlebitis from an IV site.  Saw his primary MD,  Was started on Keflex  Was discharged from the hospital  1 week ago.  Has had lots of fatigue,  Lack of energy.  Dyspnea , dizziness Noticed this about an hour after taking the metoprolol  Was started on Atorvastatin , but stopped this due to body aches and diarrhea, lack of appetite   His CHA2DS2-VASc score is 32  ( age 60, hx of TIA )  He is 64 years old.  Caught Covid Jan. 1 .   Was covid + during his hospitalization  Still has a dry hacking cough  - likely related to his past covid infection .  Some of his symptoms may be related to his Afib but more likely are related to covid.     Past Medical History:  Diagnosis Date  . Anxiety   . Cataract   . Chronic headaches   . Helicobacter pylori gastritis 2009   EGD  . Hyperlipemia   . Personal history of colonic polyps     Past Surgical History:  Procedure Laterality Date  . COLONOSCOPY  , 06/08/2011   2009: two 3 mm hyperplastic polyps, lipoma, internal hemorrjhoids 2012: 43mm cecal serrated adenoma, lipoma, internal hemorrhoids  . HERNIA REPAIR  05/04  . ROTATOR CUFF REPAIR  05/06  . UPPER GASTROINTESTINAL ENDOSCOPY  02/02/2008   erosive gastropathy    Current Medications: Current Meds  Medication Sig  . apixaban (ELIQUIS) 5 MG TABS tablet Take 1 tablet (5 mg total) by mouth 2 (two) times daily.  . cephALEXin (KEFLEX) 500 MG capsule Take 1 capsule (500 mg total) by mouth 3 (three) times daily.  . cholecalciferol (VITAMIN  D3) 25 MCG (1000 UNIT) tablet Take 1,000 Units by mouth daily.  Marland Kitchen levothyroxine (SYNTHROID) 25 MCG tablet Take 1 tablet by mouth once daily  . Lutein 6 MG TABS Take 6 mg by mouth daily at 12 noon.  . metoprolol tartrate (LOPRESSOR) 25 MG tablet Take 1 tablet (25 mg total) by mouth 2 (two) times daily.  . Multiple Vitamin (MULTIVITAMIN) capsule Take 1 capsule by mouth daily.  Marland Kitchen VITAMIN E PO Take 1 capsule by mouth daily.  . [DISCONTINUED] atorvastatin (LIPITOR) 40 MG tablet Take 1 tablet (40 mg total) by mouth daily.  . [DISCONTINUED] calcium carbonate (OS-CAL - DOSED IN MG OF ELEMENTAL CALCIUM) 1250 (500 Ca) MG tablet Take 1 tablet by mouth daily with breakfast.  . [DISCONTINUED] famotidine (PEPCID) 20 MG tablet Take 1 tablet (20 mg total) by mouth 2 (two) times daily.  . [DISCONTINUED] metoprolol tartrate (LOPRESSOR) 50 MG tablet Take 1 tablet (50 mg total) by mouth 2 (two) times daily.  . [DISCONTINUED] OVER THE COUNTER MEDICATION Take 300 mg by mouth in the morning, at noon, and at bedtime. Kyolic aged garlic extract     Allergies:   Lidocaine, Mepivacaine, Typhoid vi polysaccharide vaccine, Amlodipine, Atorvastatin, Doxycycline, Nitrofurantoin, and Sulfa antibiotics   Social  History   Socioeconomic History  . Marital status: Married    Spouse name: Not on file  . Number of children: 3  . Years of education: Not on file  . Highest education level: Not on file  Occupational History  . Occupation: Retired  Tobacco Use  . Smoking status: Never Smoker  . Smokeless tobacco: Never Used  Vaping Use  . Vaping Use: Never used  Substance and Sexual Activity  . Alcohol use: Not Currently    Comment: Occasional beer  . Drug use: No  . Sexual activity: Yes    Partners: Female    Birth control/protection: None  Other Topics Concern  . Not on file  Social History Narrative  . Not on file   Social Determinants of Health   Financial Resource Strain: Low Risk   . Difficulty of Paying  Living Expenses: Not hard at all  Food Insecurity: No Food Insecurity  . Worried About Charity fundraiser in the Last Year: Never true  . Ran Out of Food in the Last Year: Never true  Transportation Needs: No Transportation Needs  . Lack of Transportation (Medical): No  . Lack of Transportation (Non-Medical): No  Physical Activity: Inactive  . Days of Exercise per Week: 0 days  . Minutes of Exercise per Session: 0 min  Stress: No Stress Concern Present  . Feeling of Stress : Not at all  Social Connections: Not on file     Family History: The patient's family history includes Celiac disease in his mother; Heart disease in his father. There is no history of Colon cancer, Esophageal cancer, Liver cancer, Pancreatic cancer, Rectal cancer, Stomach cancer, or Allergic rhinitis.  ROS:   Please see the history of present illness.     All other systems reviewed and are negative.  EKGs/Labs/Other Studies Reviewed:    The following studies were reviewed today:   EKG:    Recent Labs: 10/18/2020: TSH 3.387 10/21/2020: B Natriuretic Peptide 215.6; Magnesium 2.1 10/27/2020: ALT 53; BUN 33; Creatinine, Ser 1.39; Hemoglobin 14.0; Platelets 303.0; Potassium 4.9; Sodium 139  Recent Lipid Panel    Component Value Date/Time   CHOL 226 (H) 10/19/2020 0428   TRIG 293 (H) 10/19/2020 0428   HDL 36 (L) 10/19/2020 0428   CHOLHDL 6.3 10/19/2020 0428   VLDL 59 (H) 10/19/2020 0428   LDLCALC 131 (H) 10/19/2020 0428   LDLDIRECT 57.0 08/31/2019 0952     Risk Assessment/Calculations:    CHA2DS2-VASc Score = 3   This indicates a 3.2% annual risk of stroke. The patient's score is based upon: CHF History: No HTN History: No Diabetes History: No Stroke History: Yes Vascular Disease History: No Age Score: 1 Gender Score: 0      Physical Exam:    VS:  BP 90/62   Pulse 64   Ht 5\' 6"  (1.676 m)   Wt 182 lb (82.6 kg)   BMI 29.38 kg/m     Wt Readings from Last 3 Encounters:  10/28/20 182 lb  (82.6 kg)  10/27/20 180 lb 4 oz (81.8 kg)  10/19/20 183 lb 6.8 oz (83.2 kg)     GEN:  Well nourished, well developed in no acute distress HEENT: Normal NECK: No JVD; No carotid bruits LYMPHATICS: No lymphadenopathy CARDIAC: Irreg. Irreg.  RESPIRATORY:  Clear to auscultation without rales, wheezing or rhonchi  ABDOMEN: Soft, non-tender, non-distended MUSCULOSKELETAL:  Left anticub - area of thrombophebitis  SKIN: Warm and dry NEUROLOGIC:  Alert and oriented x 3  PSYCHIATRIC:  Normal affect   ASSESSMENT:    1. Atrial fibrillation with RVR ([place])   2. Pre-procedure lab exam    PLAN:       1. Atrial fib: We had a long discussion about atrial fibrillation.  He has had a TIA in the past so his CHA2DS2-VASc score is at least 3.  We will continue with Eliquis.  Will anticipate doing cardioversion of the first week of February.  We discussed the fact that he will probably will have atrial fibrillation again that he will likely need to stay on Eliquis lifelong.  An alternative to that would be to refer him to our interventionalist for a watchman procedure.  We also discussed A. fib ablation.  We will schedule him for the cardioversion. We discussed the risk, benefits of cardioversion.  He understands and agrees to proceed.  Follow-up with me or an APP in 3 months. 2.    Shared Decision Making/Informed Consent The risks (stroke, cardiac arrhythmias rarely resulting in the need for a temporary or permanent pacemaker, skin irritation or burns and complications associated with conscious sedation including aspiration, arrhythmia, respiratory failure and death), benefits (restoration of normal sinus rhythm) and alternatives of a direct current cardioversion were explained in detail to Mr. Morella and he agrees to proceed.          Medication Adjustments/Labs and Tests Ordered: Current medicines are reviewed at length with the patient today.  Concerns regarding medicines are outlined above.   Orders Placed This Encounter  Procedures  . CBC  . Basic metabolic panel   Meds ordered this encounter  Medications  . metoprolol tartrate (LOPRESSOR) 25 MG tablet    Sig: Take 1 tablet (25 mg total) by mouth 2 (two) times daily.    Dispense:  180 tablet    Refill:  3    Patient Instructions   Medication Instructions:  Please decrease Metoprolol to 25 mg twice a day. Continue all other medications as listed.  Please take Ibuprofen 400 mg three times a day for 5 to 10 days.  Watch for signs of bleeding.  *If you need a refill on your cardiac medications before your next appointment, please call your pharmacy*  Lab Work: Please return for lab work once scheduled prior to your cardioversion.  You will a;so need Covid screening at [address] [place], [place], [place] a few days before your cardioversion.  This is a drive through testing site.  Follow the signs for testing, stay inside your vehicle and someone will come to you.  If you have labs (blood work) drawn today and your tests are completely normal, you will receive your results only by: Marland Kitchen MyChart Message (if you have MyChart) OR . A paper copy in the mail If you have any lab test that is abnormal or we need to change your treatment, we will call you to review the results.   Testing/Procedures: Your physician has requested that you have a Cardioversion. . Electrical Cardioversion uses a jolt of electricity to your heart either through paddles or wired patches attached to your chest. This is a controlled, usually prescheduled, procedure. This procedure is done at the hospital and you are not awake during the procedure. You usually go home the day of the procedure. Please see the instruction sheet given to you today for more information.  Follow-Up: At [facility], you and your health needs are our priority.  As part of our continuing mission to provide you with exceptional heart  care, we have created designated Provider  Care Teams.  These Care Teams include your primary Cardiologist (physician) and Advanced Practice Providers (APPs -  Physician Assistants and Nurse Practitioners) who all work together to provide you with the care you need, when you need it.  We recommend signing up for the patient portal called "MyChart".  Sign up information is provided on this After Visit Summary.  MyChart is used to connect with patients for Virtual Visits (Telemedicine).  Patients are able to view lab/test results, encounter notes, upcoming appointments, etc.  Non-urgent messages can be sent to your provider as well.   To learn more about what you can do with MyChart, go to [email or web address].    Your next appointment:   3 month(s)  The format for your next appointment:   In Person  Provider:   You may see Mertie Moores, MD or one of the following Advanced Practice Providers on your designated Care Team:    Richardson Dopp, PA-C  Robbie Lis, [place]    Thank you for choosing [place]!!      Electrical Cardioversion Electrical cardioversion is the delivery of a jolt of electricity to restore a normal rhythm to the heart. A rhythm that is too fast or is not regular keeps the heart from pumping well. In this procedure, sticky patches or metal paddles are placed on the chest to deliver electricity to the heart from a device. This procedure may be done in an emergency if:  There is low or no blood pressure as a result of the heart rhythm.  Normal rhythm must be restored as fast as possible to protect the brain and heart from further damage.  It may save a life. This may also be a scheduled procedure for irregular or fast heart rhythms that are not immediately life-threatening. Tell a health care provider about:  Any allergies you have.  All medicines you are taking, including vitamins, herbs, eye drops, creams, and over-the-counter medicines.  Any problems you or family members have had with  anesthetic medicines.  Any blood disorders you have.  Any surgeries you have had.  Any medical conditions you have.  Whether you are pregnant or may be pregnant. What are the risks? Generally, this is a safe procedure. However, problems may occur, including:  Allergic reactions to medicines.  A blood clot that breaks free and travels to other parts of your body.  The possible return of an abnormal heart rhythm within hours or days after the procedure.  Your heart stopping (cardiac arrest). This is rare. What happens before the procedure? Medicines  Your health care provider may have you start taking: ? Blood-thinning medicines (anticoagulants) so your blood does not clot as easily. ? Medicines to help stabilize your heart rate and rhythm.  Ask your health care provider about: ? Changing or stopping your regular medicines. This is especially important if you are taking diabetes medicines or blood thinners. ? Taking medicines such as aspirin and ibuprofen. These medicines can thin your blood. Do not take these medicines unless your health care provider tells you to take them. ? Taking over-the-counter medicines, vitamins, herbs, and supplements. General instructions  Follow instructions from your health care provider about eating or drinking restrictions.  Plan to have someone take you home from the hospital or clinic.  If you will be going home right after the procedure, plan to have someone with you for 24 hours.  Ask your health care provider what steps will be taken  to help prevent infection. These may include washing your skin with a germ-killing soap. What happens during the procedure?  An IV will be inserted into one of your veins.  Sticky patches (electrodes) or metal paddles may be placed on your chest.  You will be given a medicine to help you relax (sedative).  An electrical shock will be delivered. The procedure may vary among health care providers and  hospitals.   What can I expect after the procedure?  Your blood pressure, heart rate, breathing rate, and blood oxygen level will be monitored until you leave the hospital or clinic.  Your heart rhythm will be watched to make sure it does not change.  You may have some redness on the skin where the shocks were given. Follow these instructions at home:  Do not drive for 24 hours if you were given a sedative during your procedure.  Take over-the-counter and prescription medicines only as told by your health care provider.  Ask your health care provider how to check your pulse. Check it often.  Rest for 48 hours after the procedure or as told by your health care provider.  Avoid or limit your caffeine use as told by your health care provider.  Keep all follow-up visits as told by your health care provider. This is important. Contact a health care provider if:  You feel like your heart is beating too quickly or your pulse is not regular.  You have a serious muscle cramp that does not go away. Get help right away if:  You have discomfort in your chest.  You are dizzy or you feel faint.  You have trouble breathing or you are short of breath.  Your speech is slurred.  You have trouble moving an arm or leg on one side of your body.  Your fingers or toes turn cold or blue. Summary  Electrical cardioversion is the delivery of a jolt of electricity to restore a normal rhythm to the heart.  This procedure may be done right away in an emergency or may be a scheduled procedure if the condition is not an emergency.  Generally, this is a safe procedure.  After the procedure, check your pulse often as told by your health care provider. This information is not intended to replace advice given to you by your health care provider. Make sure you discuss any questions you have with your health care provider. Document Revised: 04/27/2019 Document Reviewed: 04/27/2019 Elsevier Patient  Education  2021 [place], Mertie Moores, MD  10/28/2020 5:08 PM    [place]

## 2020-10-28 NOTE — Patient Instructions (Addendum)
Medication Instructions:  Please decrease Metoprolol to 25 mg twice a day. Continue all other medications as listed.  Please take Ibuprofen 400 mg three times a day for 5 to 10 days.  Watch for signs of bleeding.  *If you need a refill on your cardiac medications before your next appointment, please call your pharmacy*  Lab Work: Please return for lab work once scheduled prior to your cardioversion.  You will a;so need Covid screening at [address], [place], [place] a few days before your cardioversion.  This is a drive through testing site.  Follow the signs for testing, stay inside your vehicle and someone will come to you.  If you have labs (blood work) drawn today and your tests are completely normal, you will receive your results only by: Marland Kitchen MyChart Message (if you have MyChart) OR . A paper copy in the mail If you have any lab test that is abnormal or we need to change your treatment, we will call you to review the results.   Testing/Procedures: Your physician has requested that you have a Cardioversion. . Electrical Cardioversion uses a jolt of electricity to your heart either through paddles or wired patches attached to your chest. This is a controlled, usually prescheduled, procedure. This procedure is done at the hospital and you are not awake during the procedure. You usually go home the day of the procedure. Please see the instruction sheet given to you today for more information.  Follow-Up: At [facility], you and your health needs are our priority.  As part of our continuing mission to provide you with exceptional heart care, we have created designated Provider Care Teams.  These Care Teams include your primary Cardiologist (physician) and Advanced Practice Providers (APPs -  Physician Assistants and Nurse Practitioners) who all work together to provide you with the care you need, when you need it.  We recommend signing up for the patient portal called "MyChart".   Sign up information is provided on this After Visit Summary.  MyChart is used to connect with patients for Virtual Visits (Telemedicine).  Patients are able to view lab/test results, encounter notes, upcoming appointments, etc.  Non-urgent messages can be sent to your provider as well.   To learn more about what you can do with MyChart, go to [email or web address].    Your next appointment:   3 month(s)  The format for your next appointment:   In Person  Provider:   You may see Mertie Moores, MD or one of the following Advanced Practice Providers on your designated Care Team:    Richardson Dopp, PA-C  Robbie Lis, [place]    Thank you for choosing [place]!!      Electrical Cardioversion Electrical cardioversion is the delivery of a jolt of electricity to restore a normal rhythm to the heart. A rhythm that is too fast or is not regular keeps the heart from pumping well. In this procedure, sticky patches or metal paddles are placed on the chest to deliver electricity to the heart from a device. This procedure may be done in an emergency if:  There is low or no blood pressure as a result of the heart rhythm.  Normal rhythm must be restored as fast as possible to protect the brain and heart from further damage.  It may save a life. This may also be a scheduled procedure for irregular or fast heart rhythms that are not immediately life-threatening. Tell a health care provider about:  Any allergies  you have.  All medicines you are taking, including vitamins, herbs, eye drops, creams, and over-the-counter medicines.  Any problems you or family members have had with anesthetic medicines.  Any blood disorders you have.  Any surgeries you have had.  Any medical conditions you have.  Whether you are pregnant or may be pregnant. What are the risks? Generally, this is a safe procedure. However, problems may occur, including:  Allergic reactions to medicines.  A blood  clot that breaks free and travels to other parts of your body.  The possible return of an abnormal heart rhythm within hours or days after the procedure.  Your heart stopping (cardiac arrest). This is rare. What happens before the procedure? Medicines  Your health care provider may have you start taking: ? Blood-thinning medicines (anticoagulants) so your blood does not clot as easily. ? Medicines to help stabilize your heart rate and rhythm.  Ask your health care provider about: ? Changing or stopping your regular medicines. This is especially important if you are taking diabetes medicines or blood thinners. ? Taking medicines such as aspirin and ibuprofen. These medicines can thin your blood. Do not take these medicines unless your health care provider tells you to take them. ? Taking over-the-counter medicines, vitamins, herbs, and supplements. General instructions  Follow instructions from your health care provider about eating or drinking restrictions.  Plan to have someone take you home from the hospital or clinic.  If you will be going home right after the procedure, plan to have someone with you for 24 hours.  Ask your health care provider what steps will be taken to help prevent infection. These may include washing your skin with a germ-killing soap. What happens during the procedure?  An IV will be inserted into one of your veins.  Sticky patches (electrodes) or metal paddles may be placed on your chest.  You will be given a medicine to help you relax (sedative).  An electrical shock will be delivered. The procedure may vary among health care providers and hospitals.   What can I expect after the procedure?  Your blood pressure, heart rate, breathing rate, and blood oxygen level will be monitored until you leave the hospital or clinic.  Your heart rhythm will be watched to make sure it does not change.  You may have some redness on the skin where the shocks were  given. Follow these instructions at home:  Do not drive for 24 hours if you were given a sedative during your procedure.  Take over-the-counter and prescription medicines only as told by your health care provider.  Ask your health care provider how to check your pulse. Check it often.  Rest for 48 hours after the procedure or as told by your health care provider.  Avoid or limit your caffeine use as told by your health care provider.  Keep all follow-up visits as told by your health care provider. This is important. Contact a health care provider if:  You feel like your heart is beating too quickly or your pulse is not regular.  You have a serious muscle cramp that does not go away. Get help right away if:  You have discomfort in your chest.  You are dizzy or you feel faint.  You have trouble breathing or you are short of breath.  Your speech is slurred.  You have trouble moving an arm or leg on one side of your body.  Your fingers or toes turn cold or blue. Summary  Electrical cardioversion is the delivery of a jolt of electricity to restore a normal rhythm to the heart.  This procedure may be done right away in an emergency or may be a scheduled procedure if the condition is not an emergency.  Generally, this is a safe procedure.  After the procedure, check your pulse often as told by your health care provider. This information is not intended to replace advice given to you by your health care provider. Make sure you discuss any questions you have with your health care provider. Document Revised: 04/27/2019 Document Reviewed: 04/27/2019 Elsevier Patient Education  [place].

## 2020-10-28 NOTE — H&P (View-Only) (Signed)
Cardiology Office Note:    Date:  10/28/2020   ID:  Alex Huffman, DOB May 14, 1956, MRN [id number]  PCP:  Jinny Sanders, MD  [facility] Cardiologist:  Mertie Moores, MD  [place] Electrophysiologist:  None   Referring MD: Jinny Sanders, MD   Chief Complaint  Patient presents with  . Atrial Fibrillation     Jan. 21, 2022   Alex Huffman is a 65 y.o. male with a hx of recent diagnosis of atrial fib and COVID  Has superficial thrombophlebitis from an IV site.  Saw his primary MD,  Was started on Keflex  Was discharged from the hospital  1 week ago.  Has had lots of fatigue,  Lack of energy.  Dyspnea , dizziness Noticed this about an hour after taking the metoprolol  Was started on Atorvastatin , but stopped this due to body aches and diarrhea, lack of appetite   His CHA2DS2-VASc score is 21  ( age 49, hx of TIA )  He is 65 years old.  Caught Covid Jan. 1 .   Was covid + during his hospitalization  Still has a dry hacking cough  - likely related to his past covid infection .  Some of his symptoms may be related to his Afib but more likely are related to covid.     Past Medical History:  Diagnosis Date  . Anxiety   . Cataract   . Chronic headaches   . Helicobacter pylori gastritis 2009   EGD  . Hyperlipemia   . Personal history of colonic polyps     Past Surgical History:  Procedure Laterality Date  . COLONOSCOPY  , 06/08/2011   2009: two 3 mm hyperplastic polyps, lipoma, internal hemorrjhoids 2012: 34mm cecal serrated adenoma, lipoma, internal hemorrhoids  . HERNIA REPAIR  05/04  . ROTATOR CUFF REPAIR  05/06  . UPPER GASTROINTESTINAL ENDOSCOPY  02/02/2008   erosive gastropathy    Current Medications: Current Meds  Medication Sig  . apixaban (ELIQUIS) 5 MG TABS tablet Take 1 tablet (5 mg total) by mouth 2 (two) times daily.  . cephALEXin (KEFLEX) 500 MG capsule Take 1 capsule (500 mg total) by mouth 3 (three) times daily.  . cholecalciferol (VITAMIN  D3) 25 MCG (1000 UNIT) tablet Take 1,000 Units by mouth daily.  Marland Kitchen levothyroxine (SYNTHROID) 25 MCG tablet Take 1 tablet by mouth once daily  . Lutein 6 MG TABS Take 6 mg by mouth daily at 12 noon.  . metoprolol tartrate (LOPRESSOR) 25 MG tablet Take 1 tablet (25 mg total) by mouth 2 (two) times daily.  . Multiple Vitamin (MULTIVITAMIN) capsule Take 1 capsule by mouth daily.  Marland Kitchen VITAMIN E PO Take 1 capsule by mouth daily.  . [DISCONTINUED] atorvastatin (LIPITOR) 40 MG tablet Take 1 tablet (40 mg total) by mouth daily.  . [DISCONTINUED] calcium carbonate (OS-CAL - DOSED IN MG OF ELEMENTAL CALCIUM) 1250 (500 Ca) MG tablet Take 1 tablet by mouth daily with breakfast.  . [DISCONTINUED] famotidine (PEPCID) 20 MG tablet Take 1 tablet (20 mg total) by mouth 2 (two) times daily.  . [DISCONTINUED] metoprolol tartrate (LOPRESSOR) 50 MG tablet Take 1 tablet (50 mg total) by mouth 2 (two) times daily.  . [DISCONTINUED] OVER THE COUNTER MEDICATION Take 300 mg by mouth in the morning, at noon, and at bedtime. Kyolic aged garlic extract     Allergies:   Lidocaine, Mepivacaine, Typhoid vi polysaccharide vaccine, Amlodipine, Atorvastatin, Doxycycline, Nitrofurantoin, and Sulfa antibiotics   Social  History   Socioeconomic History  . Marital status: Married    Spouse name: Not on file  . Number of children: 3  . Years of education: Not on file  . Highest education level: Not on file  Occupational History  . Occupation: Retired  Tobacco Use  . Smoking status: Never Smoker  . Smokeless tobacco: Never Used  Vaping Use  . Vaping Use: Never used  Substance and Sexual Activity  . Alcohol use: Not Currently    Comment: Occasional beer  . Drug use: No  . Sexual activity: Yes    Partners: Female    Birth control/protection: None  Other Topics Concern  . Not on file  Social History Narrative  . Not on file   Social Determinants of Health   Financial Resource Strain: Low Risk   . Difficulty of Paying  Living Expenses: Not hard at all  Food Insecurity: No Food Insecurity  . Worried About Charity fundraiser in the Last Year: Never true  . Ran Out of Food in the Last Year: Never true  Transportation Needs: No Transportation Needs  . Lack of Transportation (Medical): No  . Lack of Transportation (Non-Medical): No  Physical Activity: Inactive  . Days of Exercise per Week: 0 days  . Minutes of Exercise per Session: 0 min  Stress: No Stress Concern Present  . Feeling of Stress : Not at all  Social Connections: Not on file     Family History: The patient's family history includes Celiac disease in his mother; Heart disease in his father. There is no history of Colon cancer, Esophageal cancer, Liver cancer, Pancreatic cancer, Rectal cancer, Stomach cancer, or Allergic rhinitis.  ROS:   Please see the history of present illness.     All other systems reviewed and are negative.  EKGs/Labs/Other Studies Reviewed:    The following studies were reviewed today:   EKG:    Recent Labs: 10/18/2020: TSH 3.387 10/21/2020: B Natriuretic Peptide 215.6; Magnesium 2.1 10/27/2020: ALT 53; BUN 33; Creatinine, Ser 1.39; Hemoglobin 14.0; Platelets 303.0; Potassium 4.9; Sodium 139  Recent Lipid Panel    Component Value Date/Time   CHOL 226 (H) 10/19/2020 0428   TRIG 293 (H) 10/19/2020 0428   HDL 36 (L) 10/19/2020 0428   CHOLHDL 6.3 10/19/2020 0428   VLDL 59 (H) 10/19/2020 0428   LDLCALC 131 (H) 10/19/2020 0428   LDLDIRECT 57.0 08/31/2019 0952     Risk Assessment/Calculations:    CHA2DS2-VASc Score = 3   This indicates a 3.2% annual risk of stroke. The patient's score is based upon: CHF History: No HTN History: No Diabetes History: No Stroke History: Yes Vascular Disease History: No Age Score: 1 Gender Score: 0      Physical Exam:    VS:  BP 90/62   Pulse 64   Ht 5\' 6"  (1.676 m)   Wt 182 lb (82.6 kg)   BMI 29.38 kg/m     Wt Readings from Last 3 Encounters:  10/28/20 182 lb  (82.6 kg)  10/27/20 180 lb 4 oz (81.8 kg)  10/19/20 183 lb 6.8 oz (83.2 kg)     GEN:  Well nourished, well developed in no acute distress HEENT: Normal NECK: No JVD; No carotid bruits LYMPHATICS: No lymphadenopathy CARDIAC: Irreg. Irreg.  RESPIRATORY:  Clear to auscultation without rales, wheezing or rhonchi  ABDOMEN: Soft, non-tender, non-distended MUSCULOSKELETAL:  Left anticub - area of thrombophebitis  SKIN: Warm and dry NEUROLOGIC:  Alert and oriented x 3  PSYCHIATRIC:  Normal affect   ASSESSMENT:    1. Atrial fibrillation with RVR ([place])   2. Pre-procedure lab exam    PLAN:       1. Atrial fib: We had a long discussion about atrial fibrillation.  He has had a TIA in the past so his CHA2DS2-VASc score is at least 3.  We will continue with Eliquis.  Will anticipate doing cardioversion of the first week of February.  We discussed the fact that he will probably will have atrial fibrillation again that he will likely need to stay on Eliquis lifelong.  An alternative to that would be to refer him to our interventionalist for a watchman procedure.  We also discussed A. fib ablation.  We will schedule him for the cardioversion. We discussed the risk, benefits of cardioversion.  He understands and agrees to proceed.  Follow-up with me or an APP in 3 months. 2.    Shared Decision Making/Informed Consent The risks (stroke, cardiac arrhythmias rarely resulting in the need for a temporary or permanent pacemaker, skin irritation or burns and complications associated with conscious sedation including aspiration, arrhythmia, respiratory failure and death), benefits (restoration of normal sinus rhythm) and alternatives of a direct current cardioversion were explained in detail to Alex Huffman and he agrees to proceed.          Medication Adjustments/Labs and Tests Ordered: Current medicines are reviewed at length with the patient today.  Concerns regarding medicines are outlined above.   Orders Placed This Encounter  Procedures  . CBC  . Basic metabolic panel   Meds ordered this encounter  Medications  . metoprolol tartrate (LOPRESSOR) 25 MG tablet    Sig: Take 1 tablet (25 mg total) by mouth 2 (two) times daily.    Dispense:  180 tablet    Refill:  3    Patient Instructions   Medication Instructions:  Please decrease Metoprolol to 25 mg twice a day. Continue all other medications as listed.  Please take Ibuprofen 400 mg three times a day for 5 to 10 days.  Watch for signs of bleeding.  *If you need a refill on your cardiac medications before your next appointment, please call your pharmacy*  Lab Work: Please return for lab work once scheduled prior to your cardioversion.  You will a;so need Covid screening at [address] [place], [place], [place] a few days before your cardioversion.  This is a drive through testing site.  Follow the signs for testing, stay inside your vehicle and someone will come to you.  If you have labs (blood work) drawn today and your tests are completely normal, you will receive your results only by: Marland Kitchen MyChart Message (if you have MyChart) OR . A paper copy in the mail If you have any lab test that is abnormal or we need to change your treatment, we will call you to review the results.   Testing/Procedures: Your physician has requested that you have a Cardioversion. . Electrical Cardioversion uses a jolt of electricity to your heart either through paddles or wired patches attached to your chest. This is a controlled, usually prescheduled, procedure. This procedure is done at the hospital and you are not awake during the procedure. You usually go home the day of the procedure. Please see the instruction sheet given to you today for more information.  Follow-Up: At [facility], you and your health needs are our priority.  As part of our continuing mission to provide you with exceptional heart  care, we have created designated Provider  Care Teams.  These Care Teams include your primary Cardiologist (physician) and Advanced Practice Providers (APPs -  Physician Assistants and Nurse Practitioners) who all work together to provide you with the care you need, when you need it.  We recommend signing up for the patient portal called "MyChart".  Sign up information is provided on this After Visit Summary.  MyChart is used to connect with patients for Virtual Visits (Telemedicine).  Patients are able to view lab/test results, encounter notes, upcoming appointments, etc.  Non-urgent messages can be sent to your provider as well.   To learn more about what you can do with MyChart, go to [email or web address].    Your next appointment:   3 month(s)  The format for your next appointment:   In Person  Provider:   You may see Mertie Moores, MD or one of the following Advanced Practice Providers on your designated Care Team:    Richardson Dopp, PA-C  Robbie Lis, [place]    Thank you for choosing [place]!!      Electrical Cardioversion Electrical cardioversion is the delivery of a jolt of electricity to restore a normal rhythm to the heart. A rhythm that is too fast or is not regular keeps the heart from pumping well. In this procedure, sticky patches or metal paddles are placed on the chest to deliver electricity to the heart from a device. This procedure may be done in an emergency if:  There is low or no blood pressure as a result of the heart rhythm.  Normal rhythm must be restored as fast as possible to protect the brain and heart from further damage.  It may save a life. This may also be a scheduled procedure for irregular or fast heart rhythms that are not immediately life-threatening. Tell a health care provider about:  Any allergies you have.  All medicines you are taking, including vitamins, herbs, eye drops, creams, and over-the-counter medicines.  Any problems you or family members have had with  anesthetic medicines.  Any blood disorders you have.  Any surgeries you have had.  Any medical conditions you have.  Whether you are pregnant or may be pregnant. What are the risks? Generally, this is a safe procedure. However, problems may occur, including:  Allergic reactions to medicines.  A blood clot that breaks free and travels to other parts of your body.  The possible return of an abnormal heart rhythm within hours or days after the procedure.  Your heart stopping (cardiac arrest). This is rare. What happens before the procedure? Medicines  Your health care provider may have you start taking: ? Blood-thinning medicines (anticoagulants) so your blood does not clot as easily. ? Medicines to help stabilize your heart rate and rhythm.  Ask your health care provider about: ? Changing or stopping your regular medicines. This is especially important if you are taking diabetes medicines or blood thinners. ? Taking medicines such as aspirin and ibuprofen. These medicines can thin your blood. Do not take these medicines unless your health care provider tells you to take them. ? Taking over-the-counter medicines, vitamins, herbs, and supplements. General instructions  Follow instructions from your health care provider about eating or drinking restrictions.  Plan to have someone take you home from the hospital or clinic.  If you will be going home right after the procedure, plan to have someone with you for 24 hours.  Ask your health care provider what steps will be taken  to help prevent infection. These may include washing your skin with a germ-killing soap. What happens during the procedure?  An IV will be inserted into one of your veins.  Sticky patches (electrodes) or metal paddles may be placed on your chest.  You will be given a medicine to help you relax (sedative).  An electrical shock will be delivered. The procedure may vary among health care providers and  hospitals.   What can I expect after the procedure?  Your blood pressure, heart rate, breathing rate, and blood oxygen level will be monitored until you leave the hospital or clinic.  Your heart rhythm will be watched to make sure it does not change.  You may have some redness on the skin where the shocks were given. Follow these instructions at home:  Do not drive for 24 hours if you were given a sedative during your procedure.  Take over-the-counter and prescription medicines only as told by your health care provider.  Ask your health care provider how to check your pulse. Check it often.  Rest for 48 hours after the procedure or as told by your health care provider.  Avoid or limit your caffeine use as told by your health care provider.  Keep all follow-up visits as told by your health care provider. This is important. Contact a health care provider if:  You feel like your heart is beating too quickly or your pulse is not regular.  You have a serious muscle cramp that does not go away. Get help right away if:  You have discomfort in your chest.  You are dizzy or you feel faint.  You have trouble breathing or you are short of breath.  Your speech is slurred.  You have trouble moving an arm or leg on one side of your body.  Your fingers or toes turn cold or blue. Summary  Electrical cardioversion is the delivery of a jolt of electricity to restore a normal rhythm to the heart.  This procedure may be done right away in an emergency or may be a scheduled procedure if the condition is not an emergency.  Generally, this is a safe procedure.  After the procedure, check your pulse often as told by your health care provider. This information is not intended to replace advice given to you by your health care provider. Make sure you discuss any questions you have with your health care provider. Document Revised: 04/27/2019 Document Reviewed: 04/27/2019 Elsevier Patient  Education  2021 [place], Mertie Moores, MD  10/28/2020 5:08 PM    [place]

## 2020-10-31 ENCOUNTER — Telehealth: Payer: Self-pay | Admitting: Cardiovascular Disease

## 2020-10-31 ENCOUNTER — Other Ambulatory Visit: Payer: Medicare Other

## 2020-10-31 ENCOUNTER — Other Ambulatory Visit: Payer: Self-pay | Admitting: Cardiovascular Disease

## 2020-10-31 DIAGNOSIS — Z125 Encounter for screening for malignant neoplasm of prostate: Secondary | ICD-10-CM

## 2020-10-31 NOTE — Addendum Note (Signed)
Addended by: Trenda Moots on: 0/07/9322 10:28 AM   Modules accepted: Orders

## 2020-10-31 NOTE — Telephone Encounter (Signed)
Alex Huffman is calling stating he received a message from Dr. Acie Fredrickson wanting to know if he would be interested in being seen by the Afib clinic. He states he is interested and would like a callback from a nurse to discuss it further as well as ask questions in regards to it. Please advise.

## 2020-10-31 NOTE — Telephone Encounter (Signed)
Returned patients call with no answer, left a message to call our office back with questions.   Also sent patient a mychart message following up about the Afib clinic.

## 2020-11-01 ENCOUNTER — Other Ambulatory Visit: Payer: Self-pay

## 2020-11-01 ENCOUNTER — Ambulatory Visit (INDEPENDENT_AMBULATORY_CARE_PROVIDER_SITE_OTHER): Payer: Medicare Other | Admitting: Family Medicine

## 2020-11-01 ENCOUNTER — Encounter: Payer: Self-pay | Admitting: Family Medicine

## 2020-11-01 ENCOUNTER — Telehealth: Payer: Self-pay

## 2020-11-01 VITALS — BP 100/70 | HR 94 | Temp 97.3°F | Ht 66.0 in | Wt 183.2 lb

## 2020-11-01 DIAGNOSIS — M79602 Pain in left arm: Secondary | ICD-10-CM | POA: Insufficient documentation

## 2020-11-01 DIAGNOSIS — Z125 Encounter for screening for malignant neoplasm of prostate: Secondary | ICD-10-CM | POA: Diagnosis not present

## 2020-11-01 DIAGNOSIS — I4891 Unspecified atrial fibrillation: Secondary | ICD-10-CM | POA: Diagnosis not present

## 2020-11-01 DIAGNOSIS — L03114 Cellulitis of left upper limb: Secondary | ICD-10-CM | POA: Diagnosis not present

## 2020-11-01 LAB — PSA, TOTAL AND FREE
PSA, % Free: 19 % (calc) — ABNORMAL LOW (ref >25)
PSA, Free: 0.8 ng/mL
PSA, Total: 4.3 ng/mL — ABNORMAL HIGH (ref <=4.0)

## 2020-11-01 NOTE — Telephone Encounter (Signed)
Per chart review tab pt established care with Dr Diona Browner on 10/27/20.

## 2020-11-01 NOTE — Patient Instructions (Addendum)
Keep up with water intake.  Complete antibiotics. Please stop at the lab to have labs drawn for medicare PSA

## 2020-11-01 NOTE — Progress Notes (Signed)
Patient ID: Alex Huffman, male    DOB: 1955-12-17, 65 y.o.   MRN: [id number]  This visit was conducted in person.  BP 100/70   Pulse 94   Temp (!) 97.3 F (36.3 C) (Temporal)   Ht 5\' 6"  (1.676 m)   Wt 183 lb 4 oz (83.1 kg)   SpO2 98%   BMI 29.58 kg/m    CC:  Chief Complaint  Patient presents with  . Follow-up    Left Arm    Subjective:   HPI: Alex Huffman is a 65 y.o. male presenting on 11/01/2020 for Follow-up (Left Arm)  left arm hematoma from IV site placed in hospital with possible associated cellulitis. Seen on 10/28/2019 started on keflex x 7 days. He has noted less swelling in left arm, less redness. Still hand stiffness, pain into left bicep. No drainage, no fever.  Persistent Atrial fibrillation: recent new start.  Follow up with Dr. Melburn Popper on 10/28/20 ( reviewed note in detail). Plan cardioversion in early Feb.  Decreased metoprolol  To 25 mg BID.. feels some better.   Hx of chronic prostatitis and elevated PSA.Malvin Johns urologist in past.  Dr.Stoioff 2019  Dx with BPH in hospital on [place]... weak stream and occ irritation... this is now better with  Stopping caffeine.      Relevant past medical, surgical, family and social history reviewed and updated as indicated. Interim medical history since our last visit reviewed. Allergies and medications reviewed and updated. Outpatient Medications Prior to Visit  Medication Sig Dispense Refill  . apixaban (ELIQUIS) 5 MG TABS tablet Take 1 tablet (5 mg total) by mouth 2 (two) times daily. 60 tablet 0  . cephALEXin (KEFLEX) 500 MG capsule Take 1 capsule (500 mg total) by mouth 3 (three) times daily. 21 capsule 0  . levothyroxine (SYNTHROID) 25 MCG tablet Take 1 tablet by mouth once daily 90 tablet 0  . metoprolol tartrate (LOPRESSOR) 25 MG tablet Take 1 tablet (25 mg total) by mouth 2 (two) times daily. 180 tablet 3  . cholecalciferol (VITAMIN D3) 25 MCG (1000 UNIT) tablet Take 1,000 Units by mouth daily.    . Lutein  6 MG TABS Take 6 mg by mouth daily at 12 noon.    . Multiple Vitamin (MULTIVITAMIN) capsule Take 1 capsule by mouth daily.    Marland Kitchen VITAMIN E PO Take 1 capsule by mouth daily.     No facility-administered medications prior to visit.     Per HPI unless specifically indicated in ROS section below Review of Systems  Constitutional: Negative for fatigue and fever.  HENT: Negative for ear pain.   Eyes: Negative for pain.  Respiratory: Negative for cough and shortness of breath.   Cardiovascular: Negative for chest pain, palpitations and leg swelling.  Gastrointestinal: Negative for abdominal pain.  Genitourinary: Negative for dysuria.  Musculoskeletal: Negative for arthralgias.  Neurological: Negative for syncope, light-headedness and headaches.  Psychiatric/Behavioral: Negative for dysphoric mood.   Objective:  BP 100/70   Pulse 94   Temp (!) 97.3 F (36.3 C) (Temporal)   Ht 5\' 6"  (1.676 m)   Wt 183 lb 4 oz (83.1 kg)   SpO2 98%   BMI 29.58 kg/m   Wt Readings from Last 3 Encounters:  11/01/20 183 lb 4 oz (83.1 kg)  10/28/20 182 lb (82.6 kg)  10/27/20 180 lb 4 oz (81.8 kg)      Physical Exam Constitutional:      General: Vital signs are  normal.     Appearance: He is well-developed and well-nourished.  HENT:     Head: Normocephalic.     Right Ear: Hearing normal.     Left Ear: Hearing normal.     Nose: Nose normal.     Mouth/Throat:     Mouth: Oropharynx is clear and moist and mucous membranes are normal.  Neck:     Thyroid: No thyroid mass or thyromegaly.     Vascular: No carotid bruit.     Trachea: Trachea normal.  Cardiovascular:     Rate and Rhythm: Normal rate and regular rhythm.     Pulses: Normal pulses.     Heart sounds: Heart sounds not distant. No murmur heard. No friction rub. No gallop.      Comments: No peripheral edema Pulmonary:     Effort: Pulmonary effort is normal. No respiratory distress.     Breath sounds: Normal breath sounds.  Skin:    General:  Skin is warm, dry and intact.     Findings: No rash.          Comments: Less erythema, no heat and less firmness in left antecubital fossa  Psychiatric:        Mood and Affect: Mood and affect normal.        Speech: Speech normal.        Behavior: Behavior normal.        Thought Content: Thought content normal.       Results for orders placed or performed in visit on 10/27/20  CBC with Differential/Platelet  Result Value Ref Range   WBC 9.6 4.0 - 10.5 K/uL   RBC 4.81 4.22 - 5.81 Mil/uL   Hemoglobin 14.0 13.0 - 17.0 g/dL   HCT 42.5 39.0 - 52.0 %   MCV 88.3 78.0 - 100.0 fl   MCHC 32.9 30.0 - 36.0 g/dL   RDW 12.6 11.5 - 15.5 %   Platelets 303.0 150.0 - 400.0 K/uL   Neutrophils Relative % 64.8 43.0 - 77.0 %   Lymphocytes Relative 21.0 12.0 - 46.0 %   Monocytes Relative 9.6 3.0 - 12.0 %   Eosinophils Relative 3.0 0.0 - 5.0 %   Basophils Relative 1.6 0.0 - 3.0 %   Neutro Abs 6.2 1.4 - 7.7 K/uL   Lymphs Abs 2.0 0.7 - 4.0 K/uL   Monocytes Absolute 0.9 0.1 - 1.0 K/uL   Eosinophils Absolute 0.3 0.0 - 0.7 K/uL   Basophils Absolute 0.2 (H) 0.0 - 0.1 K/uL  Comprehensive metabolic panel  Result Value Ref Range   Sodium 139 135 - 145 mEq/L   Potassium 4.9 3.5 - 5.1 mEq/L   Chloride 105 96 - 112 mEq/L   CO2 26 19 - 32 mEq/L   Glucose, Bld 104 (H) 70 - 99 mg/dL   BUN 33 (H) 6 - 23 mg/dL   Creatinine, Ser 1.39 0.40 - 1.50 mg/dL   Total Bilirubin 0.6 0.2 - 1.2 mg/dL   Alkaline Phosphatase 94 39 - 117 U/L   AST 27 0 - 37 U/L   ALT 53 0 - 53 U/L   Total Protein 6.7 6.0 - 8.3 g/dL   Albumin 4.2 3.5 - 5.2 g/dL   GFR 52.75 (L) >60.00 mL/min   Calcium 9.3 8.4 - 10.5 mg/dL    This visit occurred during the SARS-CoV-2 public health emergency.  Safety protocols were in place, including screening questions prior to the visit, additional usage of staff PPE, and extensive cleaning of exam room  while observing appropriate contact time as indicated for disinfecting solutions.   COVID 19 screen:   No recent travel or known exposure to COVID19 The patient denies respiratory symptoms of COVID 19 at this time. The importance of social distancing was discussed today.   Assessment and Plan Problem List Items Addressed This Visit    Atrial fibrillation with RVR ([place])    Stab;le.. followed by cardiology. Reviewed recent note. Plan cardioversion.      Cellulitis of left upper extremity   Left arm pain - Primary    Phlebitis/ hematoma/ cellulitis.Marland Kitchen improving.  Complete antibiotics and continue warm compress. Call if not continuing to improve.       Other Visit Diagnoses    Prostate cancer screening             Eliezer Lofts, MD

## 2020-11-03 ENCOUNTER — Encounter (HOSPITAL_COMMUNITY): Payer: Self-pay | Admitting: Physician Assistant

## 2020-11-03 ENCOUNTER — Other Ambulatory Visit: Payer: Self-pay

## 2020-11-03 ENCOUNTER — Ambulatory Visit (HOSPITAL_COMMUNITY)
Admission: RE | Admit: 2020-11-03 | Discharge: 2020-11-03 | Disposition: A | Discharge status: Home / Self Care | Payer: Medicare Other | Source: Ambulatory Visit | Attending: Physician Assistant | Admitting: Physician Assistant

## 2020-11-03 VITALS — BP 102/80 | HR 91 | Ht 66.0 in | Wt 178.0 lb

## 2020-11-03 DIAGNOSIS — I4819 Other persistent atrial fibrillation: Secondary | ICD-10-CM | POA: Insufficient documentation

## 2020-11-03 DIAGNOSIS — Z8673 Personal history of transient ischemic attack (TIA), and cerebral infarction without residual deficits: Secondary | ICD-10-CM | POA: Diagnosis not present

## 2020-11-03 DIAGNOSIS — Z79890 Hormone replacement therapy (postmenopausal): Secondary | ICD-10-CM | POA: Diagnosis not present

## 2020-11-03 DIAGNOSIS — Z86010 Personal history of colonic polyps: Secondary | ICD-10-CM | POA: Insufficient documentation

## 2020-11-03 DIAGNOSIS — Z79899 Other long term (current) drug therapy: Secondary | ICD-10-CM | POA: Insufficient documentation

## 2020-11-03 DIAGNOSIS — Z7901 Long term (current) use of anticoagulants: Secondary | ICD-10-CM | POA: Insufficient documentation

## 2020-11-03 DIAGNOSIS — D6869 Other thrombophilia: Secondary | ICD-10-CM | POA: Diagnosis not present

## 2020-11-03 DIAGNOSIS — E785 Hyperlipidemia, unspecified: Secondary | ICD-10-CM | POA: Insufficient documentation

## 2020-11-03 DIAGNOSIS — Z8616 Personal history of COVID-19: Secondary | ICD-10-CM | POA: Insufficient documentation

## 2020-11-03 NOTE — Progress Notes (Addendum)
Primary Care Physician: Jinny Sanders, MD Primary Cardiologist: Dr Acie Fredrickson Primary Electrophysiologist: none Referring Physician: Dr Paulette Blanch is a 65 y.o. male with a history of prior TIA, HLD, and atrial fibrillation who presents for consultation in the [facility].  The patient was initially diagnosed with atrial fibrillation 10/18/20 after presenting to the ED with symptoms of palpitations and SOB. ECG showed afib with RVR and he was started on metoprolol for rate control and Eliquis for a CHADS2VASC score of 3. He also tested positive for COVID at that time. He was seen by Dr Acie Fredrickson 10/28/20 and outpatient DCCV is scheduled for 11/09/20. He denies significant snoring or alcohol use.   Today, he denies symptoms of chest pain, orthopnea, PND, lower extremity edema, dizziness, presyncope, syncope, snoring, daytime somnolence, bleeding, or neurologic sequela. The patient is tolerating medications without difficulties and is otherwise without complaint today.    Atrial Fibrillation Risk Factors:  he does not have symptoms or diagnosis of sleep apnea. he does not have a history of rheumatic fever. he does not have a history of alcohol use. The patient does not have a history of early familial atrial fibrillation or other arrhythmias.  he has a BMI of Body mass index is 28.73 kg/m.Marland Kitchen Filed Weights   11/03/20 1459  Weight: 80.7 kg    Family History  Problem Relation Age of Onset  . Heart disease Father   . Celiac disease Mother   . Colon cancer Neg Hx   . Esophageal cancer Neg Hx   . Liver cancer Neg Hx   . Pancreatic cancer Neg Hx   . Rectal cancer Neg Hx   . Stomach cancer Neg Hx   . Allergic rhinitis Neg Hx      Atrial Fibrillation Management history:  Previous antiarrhythmic drugs: none Previous cardioversions: none Previous ablations: none CHADS2VASC score: 3 Anticoagulation history: Eliquis   Past Medical History:   Diagnosis Date  . Anxiety   . Cataract   . Chronic headaches   . Helicobacter pylori gastritis 2009   EGD  . Hyperlipemia   . Personal history of colonic polyps    Past Surgical History:  Procedure Laterality Date  . COLONOSCOPY  , 06/08/2011   2009: two 3 mm hyperplastic polyps, lipoma, internal hemorrjhoids 2012: 74mm cecal serrated adenoma, lipoma, internal hemorrhoids  . HERNIA REPAIR  05/04  . ROTATOR CUFF REPAIR  05/06  . UPPER GASTROINTESTINAL ENDOSCOPY  02/02/2008   erosive gastropathy    Current Outpatient Medications  Medication Sig Dispense Refill  . apixaban (ELIQUIS) 5 MG TABS tablet Take 1 tablet (5 mg total) by mouth 2 (two) times daily. 60 tablet 0  . cephALEXin (KEFLEX) 500 MG capsule Take 1 capsule (500 mg total) by mouth 3 (three) times daily. 21 capsule 0  . levothyroxine (SYNTHROID) 25 MCG tablet Take 1 tablet by mouth once daily 90 tablet 0  . metoprolol tartrate (LOPRESSOR) 25 MG tablet Take 1 tablet (25 mg total) by mouth 2 (two) times daily. 180 tablet 3   No current facility-administered medications for this encounter.    Allergies  Allergen Reactions  . Typhoid Vi Polysaccharide Vaccine Anaphylaxis and Other (See Comments)    Comatose, almost died   . Amlodipine Other (See Comments)    Leg pain and fatigue  . Atorvastatin Diarrhea    Body ache  . Doxycycline Other (See Comments)    Unknown reaction  . Nitrofurantoin Other (See  Comments) and Nausea And Vomiting  . Sulfa Antibiotics Hives    Social History   Socioeconomic History  . Marital status: Married    Spouse name: Not on file  . Number of children: 3  . Years of education: Not on file  . Highest education level: Not on file  Occupational History  . Occupation: Retired  Tobacco Use  . Smoking status: Never Smoker  . Smokeless tobacco: Never Used  Vaping Use  . Vaping Use: Never used  Substance and Sexual Activity  . Alcohol use: Not Currently    Comment: Occasional beer  .  Drug use: No  . Sexual activity: Yes    Partners: Female    Birth control/protection: None  Other Topics Concern  . Not on file  Social History Narrative  . Not on file   Social Determinants of Health   Financial Resource Strain: Low Risk   . Difficulty of Paying Living Expenses: Not hard at all  Food Insecurity: No Food Insecurity  . Worried About Charity fundraiser in the Last Year: Never true  . Ran Out of Food in the Last Year: Never true  Transportation Needs: No Transportation Needs  . Lack of Transportation (Medical): No  . Lack of Transportation (Non-Medical): No  Physical Activity: Inactive  . Days of Exercise per Week: 0 days  . Minutes of Exercise per Session: 0 min  Stress: No Stress Concern Present  . Feeling of Stress : Not at all  Social Connections: Not on file  Intimate Partner Violence: Not At Risk  . Fear of Current or Ex-Partner: No  . Emotionally Abused: No  . Physically Abused: No  . Sexually Abused: No     ROS- All systems are reviewed and negative except as per the HPI above.  Physical Exam: Vitals:   11/03/20 1459  BP: 102/80  Pulse: 91  Weight: 80.7 kg  Height: 5\' 6"  (1.676 m)    GEN- The patient is well appearing, alert and oriented x 3 today.   Head- normocephalic, atraumatic Eyes-  Sclera clear, conjunctiva pink Ears- hearing intact Oropharynx- clear Neck- supple  Lungs- Clear to ausculation bilaterally, normal work of breathing Heart- irregular rate and rhythm, no murmurs, rubs or gallops  GI- soft, NT, ND, + BS Extremities- no clubbing, cyanosis, or edema MS- no significant deformity or atrophy Skin- no rash or lesion Psych- euthymic mood, full affect Neuro- strength and sensation are intact  Wt Readings from Last 3 Encounters:  11/03/20 80.7 kg  11/01/20 83.1 kg  10/28/20 82.6 kg    EKG today demonstrates  Afib, RBBB Vent. rate 91 BPM QRS duration 132 ms QT/QTc 366/450 ms  Echo 10/19/20 demonstrated  1. Left  ventricular ejection fraction, by estimation, is 55 to 60%. The  left ventricle has normal function. The left ventricle has no regional  wall motion abnormalities. There is moderate left ventricular hypertrophy.  Left ventricular diastolic  parameters are indeterminate.  2. Right ventricular systolic function is normal. The right ventricular  size is normal.  3. The mitral valve is abnormal. No evidence of mitral valve  regurgitation. No evidence of mitral stenosis. Moderate mitral annular  calcification.  4. The aortic valve is tricuspid. Aortic valve regurgitation is not  visualized. Mild to moderate aortic valve sclerosis/calcification is  present, without any evidence of aortic stenosis.  5. The inferior vena cava is normal in size with greater than 50%  respiratory variability, suggesting right atrial pressure of 3 mmHg.  Epic records are reviewed at length today  CHA2DS2-VASc Score = 3  The patient's score is based upon: CHF History: No HTN History: No Diabetes History: No Stroke History: Yes Vascular Disease History: No Age Score: 1 Gender Score: 0      ASSESSMENT AND PLAN: 1. Persistent Atrial Fibrillation (ICD10:  I48.19) The patient's CHA2DS2-VASc score is 3, indicating a 3.2% annual risk of stroke.   General education about afib provided and questions answered. We also discussed DCCV risks and benefits in detail.  DCCV scheduled for 11/09/20. He was diagnosed with COVID within the last 90 days, does not require testing before procedure. Recent labs reviewed.  Continue Eliquis 5 mg BID Continue Lopressor 25 mg BID  2. Secondary Hypercoagulable State (ICD10:  D68.69) The patient is at significant risk for stroke/thromboembolism based upon his CHA2DS2-VASc Score of 3.  Continue Apixaban (Eliquis).     Follow up in the AF clinic one week post DCCV.    [facility] [address] [place], [place]  [address] [phone] 11/03/2020 3:15 PM

## 2020-11-03 NOTE — Patient Instructions (Addendum)
Cardioversion scheduled for Wednesday, February 2nd  - Arrive at the [facility] and go to admitting at 930AM  - Do not eat or drink anything after midnight the night prior to your procedure.  - Take all your morning medication (except diabetic medications) with a sip of water prior to arrival.  - You will not be able to drive home after your procedure.  - Do NOT miss any doses of your blood thinner - if you should miss a dose please notify our office immediately.  - If you feel as if you go back into normal rhythm prior to scheduled cardioversion, please notify our office immediately. If your procedure is canceled in the cardioversion suite you will be charged a cancellation fee.

## 2020-11-07 ENCOUNTER — Telehealth: Payer: Self-pay | Admitting: Cardiovascular Disease

## 2020-11-07 ENCOUNTER — Other Ambulatory Visit (HOSPITAL_COMMUNITY): Payer: Medicare Other

## 2020-11-07 ENCOUNTER — Other Ambulatory Visit: Payer: Medicare Other

## 2020-11-07 NOTE — Telephone Encounter (Signed)
Juluis Mire, RN  You 6 minutes ago (11:12 AM)     Labs have to be within 14 days of cardioversion he had them on 1/20 of this year. That's why the labs were canceled for today. His covid test was canceled since he had a positive test within the last 90 days.  Thanks!  Stacy    Confirmed with Atrial Fib Clinic RN who canceled patients lab work on 1/31. This RN concerned that patient needed pre-admission labs prior to procedure within one week of actual procedure.   See above message from [place], [place].

## 2020-11-07 NOTE — Telephone Encounter (Signed)
Patient wanted to know if there were any other instructions that he needed to be aware of prior to his Cardioversion on Wednesday. Please send instructions to him via MyChart

## 2020-11-07 NOTE — Telephone Encounter (Addendum)
Patient calling in for Cardioversion instructions. Resent the instruction letter through [facility]. Patient states it was received.   Patient states he was told he did not need pre admission lab work. Lab work originally scheduled at time of scheduling cardioversion for today 1/31 but has since been canceled by Afib clinic.   Will route to Afib clinic to confirm that patient does not need pre-admission lab work. Patients last lab results over 10+ days ago. [facility] policy typically requires patients labs to be within 7 days of procedure.

## 2020-11-09 ENCOUNTER — Encounter (HOSPITAL_COMMUNITY)
Admission: RE | Disposition: A | Discharge status: Home / Self Care | Payer: Self-pay | Source: Home / Self Care | Attending: Cardiovascular Disease

## 2020-11-09 ENCOUNTER — Ambulatory Visit (HOSPITAL_COMMUNITY): Payer: Medicare Other | Admitting: Certified Registered Nurse Anesthetist

## 2020-11-09 ENCOUNTER — Ambulatory Visit (HOSPITAL_COMMUNITY)
Admission: RE | Admit: 2020-11-09 | Discharge: 2020-11-09 | Disposition: A | Discharge status: Home / Self Care | Payer: Medicare Other | Attending: Cardiovascular Disease | Admitting: Cardiovascular Disease

## 2020-11-09 ENCOUNTER — Encounter (HOSPITAL_COMMUNITY): Payer: Self-pay | Admitting: Cardiovascular Disease

## 2020-11-09 DIAGNOSIS — Z79890 Hormone replacement therapy (postmenopausal): Secondary | ICD-10-CM | POA: Diagnosis not present

## 2020-11-09 DIAGNOSIS — Z882 Allergy status to sulfonamides status: Secondary | ICD-10-CM | POA: Diagnosis not present

## 2020-11-09 DIAGNOSIS — Z7901 Long term (current) use of anticoagulants: Secondary | ICD-10-CM | POA: Diagnosis not present

## 2020-11-09 DIAGNOSIS — Z888 Allergy status to other drugs, medicaments and biological substances status: Secondary | ICD-10-CM | POA: Diagnosis not present

## 2020-11-09 DIAGNOSIS — Z79899 Other long term (current) drug therapy: Secondary | ICD-10-CM | POA: Insufficient documentation

## 2020-11-09 DIAGNOSIS — I4891 Unspecified atrial fibrillation: Secondary | ICD-10-CM | POA: Diagnosis present

## 2020-11-09 DIAGNOSIS — Z8616 Personal history of COVID-19: Secondary | ICD-10-CM | POA: Diagnosis not present

## 2020-11-09 HISTORY — PX: CARDIOVERSION: SHX1299

## 2020-11-09 SURGERY — CARDIOVERSION
Anesthesia: General

## 2020-11-09 MED ORDER — SODIUM CHLORIDE 0.9 % IV SOLN: INTRAVENOUS | Status: DC | PRN

## 2020-11-09 MED ORDER — PROPOFOL 10 MG/ML IV BOLUS
INTRAVENOUS | Status: DC | PRN
  Administered 2020-11-09: 80 mg via INTRAVENOUS

## 2020-11-09 MED ORDER — LIDOCAINE 2% (20 MG/ML) 5 ML SYRINGE
INTRAMUSCULAR | Status: DC | PRN
  Administered 2020-11-09: 100 mg via INTRAVENOUS

## 2020-11-09 NOTE — Interval H&P Note (Signed)
History and Physical Interval Note:  11/09/2020 9:37 AM  Alex Huffman  has presented today for surgery, with the diagnosis of AFIB.  The various methods of treatment have been discussed with the patient and family. After consideration of risks, benefits and other options for treatment, the patient has consented to  Procedure(s): CARDIOVERSION (N/A) as a surgical intervention.  The patient's history has been reviewed, patient examined, no change in status, stable for surgery.  I have reviewed the patient's chart and labs.  Questions were answered to the patient's satisfaction.    NPO for DCCV for Afib. On eliquis since Jan 11 which is > 3 weeks. No missed doses.  Lake Bells T. Audie Box, MD, [place]  [address], [place], [place] [address] [phone]  9:38 AM

## 2020-11-09 NOTE — CV Procedure (Signed)
   DIRECT CURRENT CARDIOVERSION  NAME:  Alex Huffman    MRN: [id number] DOB:  June 10, 1956    ADMIT DATE: 11/09/2020  Indication:  Symptomatic atrial fibrillation   Procedure Note:  The patient signed informed consent.  They have had had therapeutic anticoagulation with eliquis greater than 3 weeks.  Anesthesia was administered by Dr. Joneen Boers.  Adequate airway was maintained throughout and vital followed per protocol.  They were cardioverted x 1 with 200J of biphasic synchronized energy.  They converted to NSR.  There were no apparent complications.  The patient had normal neuro status and respiratory status post procedure with vitals stable as recorded elsewhere.    Follow up: They will continue on current medical therapy and follow up with cardiology as scheduled.  Lake Bells T. Audie Box, [place]  [address] [place], [place] [address] [phone]  10:45 AM

## 2020-11-09 NOTE — Discharge Instructions (Signed)
Electrical Cardioversion Electrical cardioversion is the delivery of a jolt of electricity to restore a normal rhythm to the heart. A rhythm that is too fast or is not regular keeps the heart from pumping well. In this procedure, sticky patches or metal paddles are placed on the chest to deliver electricity to the heart from a device. This procedure may be done in an emergency if:  There is low or no blood pressure as a result of the heart rhythm.  Normal rhythm must be restored as fast as possible to protect the brain and heart from further damage.  It may save a life. This may also be a scheduled procedure for irregular or fast heart rhythms that are not immediately life-threatening. Tell a health care provider about:  Any allergies you have.  All medicines you are taking, including vitamins, herbs, eye drops, creams, and over-the-counter medicines.  Any problems you or family members have had with anesthetic medicines.  Any blood disorders you have.  Any surgeries you have had.  Any medical conditions you have.  Whether you are pregnant or may be pregnant. What are the risks? Generally, this is a safe procedure. However, problems may occur, including:  Allergic reactions to medicines.  A blood clot that breaks free and travels to other parts of your body.  The possible return of an abnormal heart rhythm within hours or days after the procedure.  Your heart stopping (cardiac arrest). This is rare. What happens before the procedure? Medicines  Your health care provider may have you start taking: ? Blood-thinning medicines (anticoagulants) so your blood does not clot as easily. ? Medicines to help stabilize your heart rate and rhythm.  Ask your health care provider about: ? Changing or stopping your regular medicines. This is especially important if you are taking diabetes medicines or blood thinners. ? Taking medicines such as aspirin and ibuprofen. These medicines can  thin your blood. Do not take these medicines unless your health care provider tells you to take them. ? Taking over-the-counter medicines, vitamins, herbs, and supplements. General instructions  Follow instructions from your health care provider about eating or drinking restrictions.  Plan to have someone take you home from the hospital or clinic.  If you will be going home right after the procedure, plan to have someone with you for 24 hours.  Ask your health care provider what steps will be taken to help prevent infection. These may include washing your skin with a germ-killing soap. What happens during the procedure?  An IV will be inserted into one of your veins.  Sticky patches (electrodes) or metal paddles may be placed on your chest.  You will be given a medicine to help you relax (sedative).  An electrical shock will be delivered. The procedure may vary among health care providers and hospitals.   What can I expect after the procedure?  Your blood pressure, heart rate, breathing rate, and blood oxygen level will be monitored until you leave the hospital or clinic.  Your heart rhythm will be watched to make sure it does not change.  You may have some redness on the skin where the shocks were given. Follow these instructions at home:  Do not drive for 24 hours if you were given a sedative during your procedure.  Take over-the-counter and prescription medicines only as told by your health care provider.  Ask your health care provider how to check your pulse. Check it often.  Rest for 48 hours after the procedure   or as told by your health care provider.  Avoid or limit your caffeine use as told by your health care provider.  Keep all follow-up visits as told by your health care provider. This is important. Contact a health care provider if:  You feel like your heart is beating too quickly or your pulse is not regular.  You have a serious muscle cramp that does not go  away. Get help right away if:  You have discomfort in your chest.  You are dizzy or you feel faint.  You have trouble breathing or you are short of breath.  Your speech is slurred.  You have trouble moving an arm or leg on one side of your body.  Your fingers or toes turn cold or blue. Summary  Electrical cardioversion is the delivery of a jolt of electricity to restore a normal rhythm to the heart.  This procedure may be done right away in an emergency or may be a scheduled procedure if the condition is not an emergency.  Generally, this is a safe procedure.  After the procedure, check your pulse often as told by your health care provider. This information is not intended to replace advice given to you by your health care provider. Make sure you discuss any questions you have with your health care provider. Document Revised: 04/27/2019 Document Reviewed: 04/27/2019 Elsevier Patient Education  2021 Elsevier Inc.  

## 2020-11-09 NOTE — Transfer of Care (Signed)
Immediate Anesthesia Transfer of Care Note  Patient: Alex Huffman  Procedure(s) Performed: CARDIOVERSION (N/A )  Patient Location: Endoscopy Unit  Anesthesia Type:General  Level of Consciousness: awake and drowsy  Airway & Oxygen Therapy: Patient Spontanous Breathing  Post-op Assessment: Report given to RN and Post -op Vital signs reviewed and stable  Post vital signs: Reviewed and stable  Last Vitals:  Vitals Value Taken Time  BP    Temp    Pulse    Resp    SpO2      Last Pain:  Vitals:   11/09/20 0923  TempSrc: Tympanic  PainSc: 0-No pain         Complications: No complications documented.

## 2020-11-09 NOTE — Anesthesia Preprocedure Evaluation (Addendum)
Anesthesia Evaluation  Patient identified by MRN, date of birth, ID band Patient awake    Reviewed: Allergy & Precautions, NPO status , Patient's Chart, lab work & pertinent test results  Airway Mallampati: II  TM Distance: >3 FB Neck ROM: Full    Dental  (+) Dental Advisory Given, Teeth Intact   Pulmonary neg pulmonary ROS,    Pulmonary exam normal breath sounds clear to auscultation       Cardiovascular hypertension, Pt. on home beta blockers +CHF  Normal cardiovascular exam Rhythm:Regular Rate:Normal  Echo 10/19/20 1. Left ventricular ejection fraction, by estimation, is 55 to 60%. The left ventricle has normal function. The left ventricle has no regional wall motion abnormalities. There is moderate left ventricular hypertrophy. Left ventricular diastolic parameters are indeterminate.  2. Right ventricular systolic function is normal. The right ventricular size is normal.  3. The mitral valve is abnormal. No evidence of mitral valve regurgitation. No evidence of mitral stenosis. Moderate mitral annular calcification.  4. The aortic valve is tricuspid. Aortic valve regurgitation is not visualized. Mild to moderate aortic valve sclerosis/calcification is present, without any evidence of aortic stenosis.  5. The inferior vena cava is normal in size with greater than 50% respiratory variability, suggesting right atrial pressure of 3 mmHg.    Neuro/Psych  Headaches, PSYCHIATRIC DISORDERS Anxiety    GI/Hepatic negative GI ROS, Neg liver ROS,   Endo/Other  negative endocrine ROS  Renal/GU negative Renal ROS     Musculoskeletal negative musculoskeletal ROS (+)   Abdominal   Peds  Hematology negative hematology ROS (+)   Anesthesia Other Findings   Reproductive/Obstetrics                            Anesthesia Physical Anesthesia Plan  ASA: III  Anesthesia Plan: General   Post-op Pain  Management:    Induction: Intravenous  PONV Risk Score and Plan: 2 and Propofol infusion and Treatment may vary due to age or medical condition  Airway Management Planned: Natural Airway  Additional Equipment: None  Intra-op Plan:   Post-operative Plan:   Informed Consent: I have reviewed the patients History and Physical, chart, labs and discussed the procedure including the risks, benefits and alternatives for the proposed anesthesia with the patient or authorized representative who has indicated his/her understanding and acceptance.     Dental advisory given  Plan Discussed with: CRNA  Anesthesia Plan Comments:        Anesthesia Quick Evaluation

## 2020-11-09 NOTE — Anesthesia Procedure Notes (Signed)
Procedure Name: General with mask airway Date/Time: 11/09/2020 10:36 AM Performed by: Harden Mo, CRNA Pre-anesthesia Checklist: Patient identified, Emergency Drugs available, Suction available and Patient being monitored Patient Re-evaluated:Patient Re-evaluated prior to induction Oxygen Delivery Method: Ambu bag Preoxygenation: Pre-oxygenation with 100% oxygen Induction Type: IV induction Ventilation: Mask ventilation without difficulty Placement Confirmation: positive ETCO2 and breath sounds checked- equal and bilateral Dental Injury: Teeth and Oropharynx as per pre-operative assessment

## 2020-11-10 ENCOUNTER — Encounter (HOSPITAL_COMMUNITY): Payer: Self-pay | Admitting: Cardiovascular Disease

## 2020-11-11 NOTE — Anesthesia Postprocedure Evaluation (Signed)
Anesthesia Post Note  Patient: Alex Huffman  Procedure(s) Performed: CARDIOVERSION (N/A )     Patient location during evaluation: PACU Anesthesia Type: General Level of consciousness: sedated and patient cooperative Pain management: pain level controlled Vital Signs Assessment: post-procedure vital signs reviewed and stable Respiratory status: spontaneous breathing Cardiovascular status: stable Anesthetic complications: no   No complications documented.  Last Vitals:  Vitals:   11/09/20 1051 11/09/20 1110  BP: 109/78 101/69  Pulse: (!) 54 (!) 52  Resp: 18 12  Temp: (!) 36.1 C   SpO2: 98% 97%    Last Pain:  Vitals:   11/09/20 1110  TempSrc:   PainSc: 0-No pain                 Nolon Nations

## 2020-11-15 ENCOUNTER — Other Ambulatory Visit: Payer: Self-pay

## 2020-11-15 ENCOUNTER — Other Ambulatory Visit: Payer: Self-pay | Admitting: Family Medicine

## 2020-11-15 ENCOUNTER — Ambulatory Visit (HOSPITAL_COMMUNITY)
Admission: RE | Admit: 2020-11-15 | Discharge: 2020-11-15 | Disposition: A | Discharge status: Home / Self Care | Payer: Medicare Other | Source: Ambulatory Visit | Attending: Physician Assistant | Admitting: Physician Assistant

## 2020-11-15 VITALS — BP 136/74 | HR 47 | Ht 66.0 in | Wt 183.8 lb

## 2020-11-15 DIAGNOSIS — I4819 Other persistent atrial fibrillation: Secondary | ICD-10-CM

## 2020-11-15 DIAGNOSIS — D6869 Other thrombophilia: Secondary | ICD-10-CM | POA: Diagnosis not present

## 2020-11-15 DIAGNOSIS — Z8616 Personal history of COVID-19: Secondary | ICD-10-CM | POA: Diagnosis not present

## 2020-11-15 DIAGNOSIS — R001 Bradycardia, unspecified: Secondary | ICD-10-CM | POA: Diagnosis not present

## 2020-11-15 DIAGNOSIS — R0602 Shortness of breath: Secondary | ICD-10-CM | POA: Diagnosis not present

## 2020-11-15 DIAGNOSIS — E039 Hypothyroidism, unspecified: Secondary | ICD-10-CM

## 2020-11-15 DIAGNOSIS — Z79899 Other long term (current) drug therapy: Secondary | ICD-10-CM | POA: Diagnosis not present

## 2020-11-15 DIAGNOSIS — Z8249 Family history of ischemic heart disease and other diseases of the circulatory system: Secondary | ICD-10-CM | POA: Diagnosis not present

## 2020-11-15 DIAGNOSIS — Z7901 Long term (current) use of anticoagulants: Secondary | ICD-10-CM | POA: Diagnosis not present

## 2020-11-15 MED ORDER — APIXABAN 5 MG PO TABS: 5.0000 mg | ORAL_TABLET | Freq: Two times a day (BID) | ORAL | 6 refills | Status: DC

## 2020-11-15 MED ORDER — METOPROLOL TARTRATE 25 MG PO TABS: 12.5000 mg | ORAL_TABLET | Freq: Two times a day (BID) | ORAL | 3 refills | Status: DC

## 2020-11-15 NOTE — Telephone Encounter (Signed)
Pharmacy requests refill on: Euthyrox 25 mcg   LAST REFILL: 08/12/2020 (Q-90, R-0)  LAST OV: 11/01/2020 NEXT OV: Not Scheduled  PHARMACY: [place], [place]  TSH (10/18/2020): 3.387

## 2020-11-15 NOTE — Progress Notes (Signed)
Primary Care Physician: Jinny Sanders, MD Primary Cardiologist: Dr Acie Fredrickson Primary Electrophysiologist: none Referring Physician: Dr Paulette Blanch is a 65 y.o. male with a history of prior TIA, HLD, and atrial fibrillation who presents for follow up in the [facility].  The patient was initially diagnosed with atrial fibrillation 10/18/20 after presenting to the ED with symptoms of palpitations and SOB. ECG showed afib with RVR and he was started on metoprolol for rate control and Eliquis for a CHADS2VASC score of 3. He also tested positive for COVID at that time. He was seen by Dr Acie Fredrickson 10/28/20 and outpatient DCCV was scheduled for 11/09/20. He denies significant snoring or alcohol use.   On follow up today, patient is s/p DCCV on 11/09/20. He reports he feels better since the procedure with more energy and less SOB. He denies any bleeding issues on anticoagulation. He has noted low heart rates at home.  Today, he denies symptoms of palpitations, chest pain, orthopnea, PND, lower extremity edema, dizziness, presyncope, syncope, snoring, daytime somnolence, bleeding, or neurologic sequela. The patient is tolerating medications without difficulties and is otherwise without complaint today.    Atrial Fibrillation Risk Factors:  he does not have symptoms or diagnosis of sleep apnea. he does not have a history of rheumatic fever. he does not have a history of alcohol use. The patient does not have a history of early familial atrial fibrillation or other arrhythmias.  he has a BMI of Body mass index is 29.67 kg/m.Marland Kitchen Filed Weights   11/15/20 1409  Weight: 83.4 kg    Family History  Problem Relation Age of Onset  . Heart disease Father   . Celiac disease Mother   . Colon cancer Neg Hx   . Esophageal cancer Neg Hx   . Liver cancer Neg Hx   . Pancreatic cancer Neg Hx   . Rectal cancer Neg Hx   . Stomach cancer Neg Hx   . Allergic rhinitis Neg Hx       Atrial Fibrillation Management history:  Previous antiarrhythmic drugs: none Previous cardioversions: 11/09/20 Previous ablations: none CHADS2VASC score: 3 Anticoagulation history: Eliquis   Past Medical History:  Diagnosis Date  . Anxiety   . Cataract   . Chronic headaches   . Helicobacter pylori gastritis 2009   EGD  . Hyperlipemia   . Personal history of colonic polyps    Past Surgical History:  Procedure Laterality Date  . CARDIOVERSION N/A 11/09/2020   Procedure: CARDIOVERSION;  Surgeon: Geralynn Rile, MD;  Location: [facility];  Service: Cardiovascular;  Laterality: N/A;  . COLONOSCOPY  , 06/08/2011   2009: two 3 mm hyperplastic polyps, lipoma, internal hemorrjhoids 2012: 13mm cecal serrated adenoma, lipoma, internal hemorrhoids  . HERNIA REPAIR  05/04  . ROTATOR CUFF REPAIR  05/06  . UPPER GASTROINTESTINAL ENDOSCOPY  02/02/2008   erosive gastropathy    Current Outpatient Medications  Medication Sig Dispense Refill  . apixaban (ELIQUIS) 5 MG TABS tablet Take 1 tablet (5 mg total) by mouth 2 (two) times daily. 60 tablet 6  . EUTHYROX 25 MCG tablet Take 1 tablet by mouth once daily 90 tablet 1  . metoprolol tartrate (LOPRESSOR) 25 MG tablet Take 0.5 tablets (12.5 mg total) by mouth 2 (two) times daily. 180 tablet 3   No current facility-administered medications for this encounter.    Allergies  Allergen Reactions  . Typhoid Vaccine, Live Other (See Comments) and Anaphylaxis  .  Typhoid Vi Polysaccharide Vaccine Anaphylaxis and Other (See Comments)    Comatose, almost died   . Amlodipine Other (See Comments)    Leg pain and fatigue  . Atorvastatin Diarrhea    Body ache  . Doxycycline Other (See Comments)    Unknown reaction  . Nitrofurantoin Other (See Comments) and Nausea And Vomiting  . Sulfa Antibiotics Hives    Social History   Socioeconomic History  . Marital status: Married    Spouse name: Not on file  . Number of children: 3  . Years  of education: Not on file  . Highest education level: Not on file  Occupational History  . Occupation: Retired  Tobacco Use  . Smoking status: Never Smoker  . Smokeless tobacco: Never Used  Vaping Use  . Vaping Use: Never used  Substance and Sexual Activity  . Alcohol use: Not Currently    Comment: Occasional beer  . Drug use: No  . Sexual activity: Yes    Partners: Female    Birth control/protection: None  Other Topics Concern  . Not on file  Social History Narrative  . Not on file   Social Determinants of Health   Financial Resource Strain: Low Risk   . Difficulty of Paying Living Expenses: Not hard at all  Food Insecurity: No Food Insecurity  . Worried About Charity fundraiser in the Last Year: Never true  . Ran Out of Food in the Last Year: Never true  Transportation Needs: No Transportation Needs  . Lack of Transportation (Medical): No  . Lack of Transportation (Non-Medical): No  Physical Activity: Inactive  . Days of Exercise per Week: 0 days  . Minutes of Exercise per Session: 0 min  Stress: No Stress Concern Present  . Feeling of Stress : Not at all  Social Connections: Not on file  Intimate Partner Violence: Not At Risk  . Fear of Current or Ex-Partner: No  . Emotionally Abused: No  . Physically Abused: No  . Sexually Abused: No     ROS- All systems are reviewed and negative except as per the HPI above.  Physical Exam: Vitals:   11/15/20 1409  BP: 136/74  Pulse: (!) 47  Weight: 83.4 kg  Height: 5\' 6"  (1.676 m)    GEN- The patient is well appearing, alert and oriented x 3 today.   HEENT-head normocephalic, atraumatic, sclera clear, conjunctiva pink, hearing intact, trachea midline. Lungs- Clear to ausculation bilaterally, normal work of breathing Heart- Regular rate and rhythm, bradycardia, no murmurs, rubs or gallops  GI- soft, NT, ND, + BS Extremities- no clubbing, cyanosis, or edema MS- no significant deformity or atrophy Skin- no rash or  lesion Psych- euthymic mood, full affect Neuro- strength and sensation are intact   Wt Readings from Last 3 Encounters:  11/15/20 83.4 kg  11/09/20 80.7 kg  11/03/20 80.7 kg    EKG today demonstrates  SB, RBBB Vent. rate 47 BPM PR interval 164 ms QRS duration 134 ms QT/QTc 456/403 ms  Echo 10/19/20 demonstrated  1. Left ventricular ejection fraction, by estimation, is 55 to 60%. The  left ventricle has normal function. The left ventricle has no regional  wall motion abnormalities. There is moderate left ventricular hypertrophy.  Left ventricular diastolic  parameters are indeterminate.  2. Right ventricular systolic function is normal. The right ventricular  size is normal.  3. The mitral valve is abnormal. No evidence of mitral valve  regurgitation. No evidence of mitral stenosis. Moderate mitral annular  calcification.  4. The aortic valve is tricuspid. Aortic valve regurgitation is not  visualized. Mild to moderate aortic valve sclerosis/calcification is  present, without any evidence of aortic stenosis.  5. The inferior vena cava is normal in size with greater than 50%  respiratory variability, suggesting right atrial pressure of 3 mmHg.   Epic records are reviewed at length today  CHA2DS2-VASc Score = 3  The patient's score is based upon: CHF History: No HTN History: No Diabetes History: No Stroke History: Yes Vascular Disease History: No Age Score: 1 Gender Score: 0      ASSESSMENT AND PLAN: 1. Persistent Atrial Fibrillation (ICD10:  I48.19) The patient's CHA2DS2-VASc score is 3, indicating a 3.2% annual risk of stroke.   S/p DCCV on 11/09/20 Patient back in SR.  Continue Eliquis 5 mg BID Decrease Lopressor to 12.5 mg BID given bradycardia.   2. Secondary Hypercoagulable State (ICD10:  D68.69) The patient is at significant risk for stroke/thromboembolism based upon his CHA2DS2-VASc Score of 3.  Continue Apixaban (Eliquis).     Follow up with Dr  Acie Fredrickson as scheduled. AF clinic as needed.    [facility] [address] [place], [place] [address] [phone] 11/15/2020 4:24 PM

## 2020-11-15 NOTE — Patient Instructions (Signed)
Decrease metoprolol to 12.5mg  twice a day (1/2 of your 25mg  tablet twice a day)   Follow up with Dr. Acie Fredrickson as scheduled.

## 2020-11-18 ENCOUNTER — Ambulatory Visit: Payer: Medicare Other | Admitting: Physician Assistant

## 2020-12-12 DIAGNOSIS — E039 Hypothyroidism, unspecified: Secondary | ICD-10-CM | POA: Insufficient documentation

## 2020-12-12 NOTE — Assessment & Plan Note (Signed)
Stable, chronic.  Continue current medication.   Synthroid 25 mcg daily

## 2020-12-12 NOTE — Assessment & Plan Note (Signed)
Stab;le.. followed by cardiology. Reviewed recent note. Plan cardioversion.

## 2020-12-12 NOTE — Assessment & Plan Note (Signed)
Dx with BPH in hospital. SE to ? Unknown med in past.   Consider making follow up with Dr. Bernardo Heater for prostate issues.

## 2020-12-12 NOTE — Assessment & Plan Note (Signed)
Start antibitoics given possible IV associate infection/infected hematoma/ phlebitis.  Apply Heat and can use tyelnol for pain.  Follow up closely.

## 2020-12-12 NOTE — Assessment & Plan Note (Signed)
Due for re-eval. 

## 2020-12-12 NOTE — Assessment & Plan Note (Signed)
Phlebitis/ hematoma/ cellulitis.Marland Kitchen improving.  Complete antibiotics and continue warm compress. Call if not continuing to improve.

## 2020-12-12 NOTE — Assessment & Plan Note (Signed)
Resolved infection.

## 2020-12-12 NOTE — Assessment & Plan Note (Signed)
Followed by Cardiology. Initially on Cardizem drip, now on oral Lopressor and Eliquis, TSH &echocardiogram stable.

## 2020-12-12 NOTE — Assessment & Plan Note (Signed)
Aspirin has been switched to Eliquis.

## 2020-12-28 ENCOUNTER — Telehealth (HOSPITAL_COMMUNITY): Payer: Self-pay | Admitting: *Deleted

## 2020-12-28 ENCOUNTER — Encounter: Payer: Self-pay | Admitting: Cardiovascular Disease

## 2020-12-28 NOTE — Telephone Encounter (Signed)
error 

## 2020-12-28 NOTE — Telephone Encounter (Signed)
-----   Message from Alex Huffman, [place] sent at 12/28/2020  9:31 AM EDT ----- Yong Channel has gone back into atrial fib.   Would you be able to see him and consider him for antiarrhythmic therapy.   Thanks. Abbe Amsterdam

## 2020-12-28 NOTE — Telephone Encounter (Signed)
My scheduler called regarding request for appt since pt back in AF. Pt very unsure of how he would like to proceed. He does not want his afib "managed" he wants it "gone". Explained no cure for afib but office visit would be to discuss next steps with ERAF.  Pt wants to "think about his options".  Pt also mentioned switching his cardiologist from Dr. Cathie Olden to Dr. Audie Box during the conversation instructed pt he would need to discuss this with Dr. Acie Fredrickson. Pt states he will "call back after thinking about everything a few days."

## 2020-12-28 NOTE — Telephone Encounter (Signed)
Patient has appt with Dr. Acie Fredrickson 12/30/20.  See phone note dated today.

## 2020-12-30 ENCOUNTER — Encounter: Payer: Self-pay | Admitting: Cardiovascular Disease

## 2020-12-30 ENCOUNTER — Other Ambulatory Visit: Payer: Self-pay

## 2020-12-30 ENCOUNTER — Ambulatory Visit (INDEPENDENT_AMBULATORY_CARE_PROVIDER_SITE_OTHER): Payer: Medicare Other | Admitting: Cardiovascular Disease

## 2020-12-30 VITALS — BP 108/74 | HR 59 | Ht 66.0 in | Wt 174.0 lb

## 2020-12-30 DIAGNOSIS — I4891 Unspecified atrial fibrillation: Secondary | ICD-10-CM | POA: Diagnosis not present

## 2020-12-30 DIAGNOSIS — I4819 Other persistent atrial fibrillation: Secondary | ICD-10-CM | POA: Diagnosis not present

## 2020-12-30 NOTE — Patient Instructions (Signed)
Medication Instructions:  Your physician recommends that you continue on your current medications as directed. Please refer to the Current Medication list given to you today.  *If you need a refill on your cardiac medications before your next appointment, please call your pharmacy*   Lab Work: none If you have labs (blood work) drawn today and your tests are completely normal, you will receive your results only by: Marland Kitchen MyChart Message (if you have MyChart) OR . A paper copy in the mail If you have any lab test that is abnormal or we need to change your treatment, we will call you to review the results.   Testing/Procedures: none   Follow-Up: At [facility], you and your health needs are our priority.  As part of our continuing mission to provide you with exceptional heart care, we have created designated Provider Care Teams.  These Care Teams include your primary Cardiologist (physician) and Advanced Practice Providers (APPs -  Physician Assistants and Nurse Practitioners) who all work together to provide you with the care you need, when you need it.   Your next appointment:    as needed  The format for your next appointment:   In Person  Provider:   You may see Mertie Moores, MD or one of the following Advanced Practice Providers on your designated Care Team:    Richardson Dopp, PA-C  Robbie Lis, [place]    Other Instructions You have been referred to see Dr.Lambert an electrophysiologist.

## 2020-12-30 NOTE — Progress Notes (Addendum)
Cardiology Office Note:    Date:  12/30/2020   ID:  Alex Huffman, DOB 1956/02/28, MRN [id number]  PCP:  Jinny Sanders, MD  [facility] Cardiologist:  Mertie Moores, MD  [place] Electrophysiologist:  None   Referring MD: Jinny Sanders, MD   Chief Complaint  Patient presents with  . Atrial Fibrillation     Jan. 21, 2022   Alex Huffman is a 65 y.o. male with a hx of recent diagnosis of atrial fib and COVID  Has superficial thrombophlebitis from an IV site.  Saw his primary MD,  Was started on Keflex  Was discharged from the hospital  1 week ago.  Has had lots of fatigue,  Lack of energy.  Dyspnea , dizziness Noticed this about an hour after taking the metoprolol  Was started on Atorvastatin , but stopped this due to body aches and diarrhea, lack of appetite   His CHA2DS2-VASc score is 69  ( age 22, hx of TIA )  He is 65 years old.  Caught Covid Jan. 1 .   Was covid + during his hospitalization  Still has a dry hacking cough  - likely related to his past covid infection .  Some of his symptoms may be related to his Afib but more likely are related to covid.    March 25,  Back to discuss Afib  Had successful cardioversion  on Feb. 2 Started walking Feb. 11,  Has gradually and steadily improved .  Is now walking 3 miles at a time This past Tuesday , got up from a nap,   Didn't feel right  Felt palps ,   Took an ECG with his watch , found he was in atrial fib  Remained in NSR for 48 days.   No ETOH, Is avoiding dairy producs,  No caffeine,    Has noticed his endurance is not as good.  Ive advised him to continue to walk    Past Medical History:  Diagnosis Date  . Anxiety   . Cataract   . Chronic headaches   . Helicobacter pylori gastritis 2009   EGD  . Hyperlipemia   . Personal history of colonic polyps     Past Surgical History:  Procedure Laterality Date  . CARDIOVERSION N/A 11/09/2020   Procedure: CARDIOVERSION;  Surgeon: Geralynn Rile, MD;  Location: [facility];  Service: Cardiovascular;  Laterality: N/A;  . COLONOSCOPY  , 06/08/2011   2009: two 3 mm hyperplastic polyps, lipoma, internal hemorrjhoids 2012: 31mm cecal serrated adenoma, lipoma, internal hemorrhoids  . HERNIA REPAIR  05/04  . ROTATOR CUFF REPAIR  05/06  . UPPER GASTROINTESTINAL ENDOSCOPY  02/02/2008   erosive gastropathy    Current Medications: Current Meds  Medication Sig  . apixaban (ELIQUIS) 5 MG TABS tablet Take 1 tablet (5 mg total) by mouth 2 (two) times daily.  Arna Medici 25 MCG tablet Take 1 tablet by mouth once daily  . metoprolol tartrate (LOPRESSOR) 25 MG tablet Take 0.5 tablets (12.5 mg total) by mouth 2 (two) times daily.     Allergies:   Typhoid vaccine, live; Typhoid vi polysaccharide vaccine; Amlodipine; Atorvastatin; Doxycycline; Nitrofurantoin; and Sulfa antibiotics   Social History   Socioeconomic History  . Marital status: Married    Spouse name: Not on file  . Number of children: 3  . Years of education: Not on file  . Highest education level: Not on file  Occupational History  . Occupation: Retired  Tobacco  Use  . Smoking status: Never Smoker  . Smokeless tobacco: Never Used  Vaping Use  . Vaping Use: Never used  Substance and Sexual Activity  . Alcohol use: Not Currently    Comment: Occasional beer  . Drug use: No  . Sexual activity: Yes    Partners: Female    Birth control/protection: None  Other Topics Concern  . Not on file  Social History Narrative  . Not on file   Social Determinants of Health   Financial Resource Strain: Low Risk   . Difficulty of Paying Living Expenses: Not hard at all  Food Insecurity: No Food Insecurity  . Worried About Charity fundraiser in the Last Year: Never true  . Ran Out of Food in the Last Year: Never true  Transportation Needs: No Transportation Needs  . Lack of Transportation (Medical): No  . Lack of Transportation (Non-Medical): No  Physical Activity: Inactive   . Days of Exercise per Week: 0 days  . Minutes of Exercise per Session: 0 min  Stress: No Stress Concern Present  . Feeling of Stress : Not at all  Social Connections: Not on file     Family History: The patient's family history includes Celiac disease in his mother; Heart disease in his father. There is no history of Colon cancer, Esophageal cancer, Liver cancer, Pancreatic cancer, Rectal cancer, Stomach cancer, or Allergic rhinitis.  ROS:   Please see the history of present illness.     All other systems reviewed and are negative.  EKGs/Labs/Other Studies Reviewed:    The following studies were reviewed today:   EKG:     December 30, 2020:  Atrial fib with controlled response.   RBBB.    He had a 2.6 second pause between RR intervals.     Recent Labs: 10/18/2020: TSH 3.387 10/21/2020: B Natriuretic Peptide 215.6; Magnesium 2.1 10/27/2020: ALT 53; BUN 33; Creatinine, Ser 1.39; Hemoglobin 14.0; Platelets 303.0; Potassium 4.9; Sodium 139  Recent Lipid Panel    Component Value Date/Time   CHOL 226 (H) 10/19/2020 0428   TRIG 293 (H) 10/19/2020 0428   HDL 36 (L) 10/19/2020 0428   CHOLHDL 6.3 10/19/2020 0428   VLDL 59 (H) 10/19/2020 0428   LDLCALC 131 (H) 10/19/2020 0428   LDLDIRECT 57.0 08/31/2019 0952     Risk Assessment/Calculations:    CHA2DS2-VASc Score = 3   This indicates a 3.2% annual risk of stroke. The patient's score is based upon: CHF History: No HTN History: No Diabetes History: No Stroke History: Yes Vascular Disease History: No Age Score: 1 Gender Score: 0     Physical Exam:    Physical Exam: Blood pressure 108/74, pulse (!) 59, height 5\' 6"  (1.676 m), weight 174 lb (78.9 kg), SpO2 97 %.  GEN:  Well nourished, well developed in no acute distress HEENT: Normal NECK: No JVD; No carotid bruits LYMPHATICS: No lymphadenopathy CARDIAC: , irreg. Irreg.  RESPIRATORY:  Clear to auscultation without rales, wheezing or rhonchi  ABDOMEN: Soft, non-tender,  non-distended MUSCULOSKELETAL:  No edema; No deformity  SKIN: Warm and dry NEUROLOGIC:  Alert and oriented x 3    December 30, 2020: Atrial fibrillation.  His heart rate is mostly well controlled although he had a 2.6-second pause between our intervals on one occasion.   ASSESSMENT:    1. Persistent atrial fibrillation (HCC)    PLAN:       Atrial fib:    He is back in atrial fibrillation.  He has  a right bundle branch block.  He also had a 2.6-second pause between his RR intervals.  Reviewed EKG with Dr. Quentin Ore.  He stated that the addition of any antiarrhythmic medications would likely make these pauses worse. Continue metoprolol 12. 5 mg PO BID .   He is not really interested in pursuing medications.  He really wants to have an ablation.  We will have him return to see Dr. Quentin Ore in the next several weeks to discuss further options.  Since his only real issue is from a cardiac standpoint is his atrial fibrillation, we will continue to have him see Dr. Quentin Ore for follow-up.  I will see him on an as-needed basis.  I will be happy to see him for his TEE prior to ablation.    Shared Decision Making/Informed Consent The risks (stroke, cardiac arrhythmias rarely resulting in the need for a temporary or permanent pacemaker, skin irritation or burns and complications associated with conscious sedation including aspiration, arrhythmia, respiratory failure and death), benefits (restoration of normal sinus rhythm) and alternatives of a direct current cardioversion were explained in detail to Mr. Dykstra and he agrees to proceed.          Medication Adjustments/Labs and Tests Ordered: Current medicines are reviewed at length with the patient today.  Concerns regarding medicines are outlined above.  Orders Placed This Encounter  Procedures  . Ambulatory referral to Cardiac Electrophysiology  . EKG 12-Lead   No orders of the defined types were placed in this encounter.   Patient  Instructions  Medication Instructions:  Your physician recommends that you continue on your current medications as directed. Please refer to the Current Medication list given to you today.  *If you need a refill on your cardiac medications before your next appointment, please call your pharmacy*   Lab Work: none If you have labs (blood work) drawn today and your tests are completely normal, you will receive your results only by: Marland Kitchen MyChart Message (if you have MyChart) OR . A paper copy in the mail If you have any lab test that is abnormal or we need to change your treatment, we will call you to review the results.   Testing/Procedures: none   Follow-Up: At [facility], you and your health needs are our priority.  As part of our continuing mission to provide you with exceptional heart care, we have created designated Provider Care Teams.  These Care Teams include your primary Cardiologist (physician) and Advanced Practice Providers (APPs -  Physician Assistants and Nurse Practitioners) who all work together to provide you with the care you need, when you need it.   Your next appointment:    as needed  The format for your next appointment:   In Person  Provider:   You may see Mertie Moores, MD or one of the following Advanced Practice Providers on your designated Care Team:    Richardson Dopp, PA-C  Robbie Lis, [place]    Other Instructions You have been referred to see Dr.Lambert an electrophysiologist.       Signed, Mertie Moores, MD  12/30/2020 3:02 PM    [place]

## 2021-01-17 ENCOUNTER — Institutional Professional Consult (permissible substitution): Payer: Medicare Other | Admitting: Cardiology

## 2021-01-24 ENCOUNTER — Institutional Professional Consult (permissible substitution): Payer: Medicare Other | Admitting: Cardiology

## 2021-01-30 ENCOUNTER — Ambulatory Visit: Payer: Medicare Other | Admitting: Cardiovascular Disease

## 2021-02-06 ENCOUNTER — Encounter: Payer: Self-pay | Admitting: Cardiology

## 2021-05-06 ENCOUNTER — Other Ambulatory Visit: Payer: Self-pay | Admitting: Family Medicine

## 2021-05-06 DIAGNOSIS — E039 Hypothyroidism, unspecified: Secondary | ICD-10-CM

## 2021-05-08 ENCOUNTER — Telehealth: Payer: Self-pay | Admitting: *Deleted

## 2021-05-08 NOTE — Telephone Encounter (Signed)
Patient called because he had gotten a text from the office stating that he had gotten a refill from Dr. Diona Browner on his medication and wanted to make sure that this was legitimate. Patient advised yes this is new with our office.  Patient stated that he wanted to let Dr. Diona Browner know that he had an ablation on 02/13/21 by Dr Norm Salt at [facility]  Patient also wants to know when he is due a medicare wellness.

## 2021-05-09 ENCOUNTER — Other Ambulatory Visit: Payer: Self-pay | Admitting: *Deleted

## 2021-05-09 MED ORDER — APIXABAN 5 MG PO TABS: 5.0000 mg | ORAL_TABLET | Freq: Two times a day (BID) | ORAL | 5 refills | Status: AC

## 2021-05-09 NOTE — Telephone Encounter (Signed)
Mr. Urrego notified as instructed by telephone.  Will go ahead and send to support pool to see if they can go ahead and get him scheduled with [facility] for his [place] and CPE with fasting labs with Dr. Diona Browner for late January 2023.

## 2021-05-09 NOTE — Telephone Encounter (Signed)
Eliquis '5mg'$  paper refill request received. Patient is 65 years old, weight-78.9kg, Crea-1.39 on 10/27/20, Diagnosis-Afib, and last seen by Dr. Acie Fredrickson on 12/30/2020. Dose is appropriate based on dosing criteria. Will send in refill to requested pharmacy.

## 2021-05-09 NOTE — Telephone Encounter (Signed)
He is due for medicare wellness after 10/29/21. If he is having concerns, he  can make  a follow up ( 6-7 month)

## 2021-05-11 NOTE — Telephone Encounter (Signed)
Called pt to schedule an appt. Pt stated that it is to early to schedule and to call back in November for appts.

## 2021-07-14 ENCOUNTER — Other Ambulatory Visit (HOSPITAL_COMMUNITY): Payer: Self-pay

## 2021-08-17 ENCOUNTER — Encounter: Payer: Self-pay | Admitting: Family Medicine

## 2021-08-17 ENCOUNTER — Other Ambulatory Visit: Payer: Self-pay

## 2021-08-17 ENCOUNTER — Ambulatory Visit (INDEPENDENT_AMBULATORY_CARE_PROVIDER_SITE_OTHER): Payer: Medicare Other | Admitting: Family Medicine

## 2021-08-17 VITALS — BP 130/80 | HR 60 | Temp 98.3°F | Ht 66.0 in | Wt 185.4 lb

## 2021-08-17 DIAGNOSIS — J0100 Acute maxillary sinusitis, unspecified: Secondary | ICD-10-CM

## 2021-08-17 MED ORDER — AMOXICILLIN 500 MG PO CAPS: 1000.0000 mg | ORAL_CAPSULE | Freq: Two times a day (BID) | ORAL | 0 refills | Status: DC

## 2021-08-17 MED ORDER — HYDROCODONE BIT-HOMATROP MBR 5-1.5 MG/5ML PO SOLN: 5.0000 mL | Freq: Every evening | ORAL | 0 refills | Status: DC | PRN

## 2021-08-17 NOTE — Patient Instructions (Signed)
Start nasal saline irrigation/spray. Wear a mask during wood working. Start Amoxicillin x 10 days.  Can use prescription cough suppressant at night. C all if not improving as expected.

## 2021-08-17 NOTE — Progress Notes (Signed)
Patient ID: Alex Huffman, male    DOB: [birth year], 65 y.o.   MRN: [id number]  This visit was conducted in person.  BP 130/80   Pulse 60   Temp 98.3 F (36.8 C) (Temporal)   Ht 5\' 6"  (1.676 m)   Wt 185 lb 6 oz (84.1 kg)   SpO2 97%   BMI 29.92 kg/m    CC:  Chief Complaint  Patient presents with   Cough    X 2 weeks-Covid test negative on Monday   Headache   Sore Throat   Facial Pain         Subjective:   HPI: Alex Huffman is a 65 y.o. male with history of atrial fibrillation, diastolic heart failure, HTN presenting on 08/17/2021 for Cough (X 2 weeks-Covid test negative on Monday), Headache, Sore Throat, and Facial Pain (/)  He reports  date of onset of symptoms 2 weeks ago with  initial  ST and congestion. Now progressed to productive cough. 1 week ago changed to  dry cough Cough keeping him up at night. Right ear pain, sinus pressure R>L, still has sore throat. Headache. Teeth hurt on top on right.  Temp 100 F at night.   Mild SOB, no wheeze. No body ache.    Had Garceno in 10/2020... returned to baseline eventually once afib was treated. Has had cardioversion 11/2020 Cardiac ablation 02/2021 at [place] 05/26/2021.. he is back in sinus  Relevant past medical, surgical, family and social history reviewed and updated as indicated. Interim medical history since our last visit reviewed. Allergies and medications reviewed and updated. Outpatient Medications Prior to Visit  Medication Sig Dispense Refill   apixaban (ELIQUIS) 5 MG TABS tablet Take 1 tablet (5 mg total) by mouth 2 (two) times daily. 60 tablet 5   levothyroxine (SYNTHROID) 25 MCG tablet Take 1 tablet by mouth once daily 90 tablet 1   neomycin-polymyxin b-dexamethasone (MAXITROL) 3.5-10000-0.1 SUSP SMARTSIG:2 Drop(s) In Eye(s) Every 6 Hours     metoprolol tartrate (LOPRESSOR) 25 MG tablet Take 0.5 tablets (12.5 mg total) by mouth 2 (two) times daily. 180 tablet 3   No facility-administered  medications prior to visit.     Per HPI unless specifically indicated in ROS section below Review of Systems  Constitutional:  Negative for fatigue and fever.  HENT:  Negative for ear pain.   Eyes:  Negative for pain.  Respiratory:  Positive for cough. Negative for shortness of breath.   Cardiovascular:  Negative for chest pain, palpitations and leg swelling.  Gastrointestinal:  Negative for abdominal pain.  Genitourinary:  Negative for dysuria.  Musculoskeletal:  Negative for arthralgias.  Neurological:  Negative for syncope, light-headedness and headaches.  Psychiatric/Behavioral:  Negative for dysphoric mood.   Objective:  BP 130/80   Pulse 60   Temp 98.3 F (36.8 C) (Temporal)   Ht 5\' 6"  (1.676 m)   Wt 185 lb 6 oz (84.1 kg)   SpO2 97%   BMI 29.92 kg/m   Wt Readings from Last 3 Encounters:  08/17/21 185 lb 6 oz (84.1 kg)  12/30/20 174 lb (78.9 kg)  11/15/20 183 lb 12.8 oz (83.4 kg)      Physical Exam Constitutional:      Appearance: He is well-developed.  HENT:     Head: Normocephalic.     Right Ear: Hearing normal.     Left Ear: Hearing normal.     Nose: Nose normal.  Neck:  Thyroid: No thyroid mass or thyromegaly.     Vascular: No carotid bruit.     Trachea: Trachea normal.  Cardiovascular:     Rate and Rhythm: Normal rate and regular rhythm.     Pulses: Normal pulses.     Heart sounds: Heart sounds not distant. No murmur heard.   No friction rub. No gallop.     Comments: No peripheral edema Pulmonary:     Effort: Pulmonary effort is normal. No respiratory distress.     Breath sounds: Normal breath sounds.  Skin:    General: Skin is warm and dry.     Findings: No rash.  Psychiatric:        Speech: Speech normal.        Behavior: Behavior normal.        Thought Content: Thought content normal.      Results for orders placed or performed in visit on 10/31/20  PSA, total and free  Result Value Ref Range   PSA, Total 4.3 (H) < OR = 4.0 ng/mL   PSA,  Free 0.8 ng/mL   PSA, % Free 19 (L) >25 % (calc)    This visit occurred during the SARS-CoV-2 public health emergency.  Safety protocols were in place, including screening questions prior to the visit, additional usage of staff PPE, and extensive cleaning of exam room while observing appropriate contact time as indicated for disinfecting solutions.   COVID 19 screen:  No recent travel or known exposure to COVID19 The patient denies respiratory symptoms of COVID 19 at this time. The importance of social distancing was discussed today.   Assessment and Plan Problem List Items Addressed This Visit     Acute non-recurrent maxillary sinusitis - Primary     Start nasal saline irrigation/spray. Wear a mask during wood working. Start Amoxicillin x 10 days.  Can use prescription cough suppressant at night. C all if not improving as expected.      Relevant Medications   HYDROcodone bit-homatropine (HYCODAN) 5-1.5 MG/5ML syrup   amoxicillin (AMOXIL) 500 MG capsule   Meds ordered this encounter  Medications   HYDROcodone bit-homatropine (HYCODAN) 5-1.5 MG/5ML syrup    Sig: Take 5 mLs by mouth at bedtime as needed for cough.    Dispense:  120 mL    Refill:  0   amoxicillin (AMOXIL) 500 MG capsule    Sig: Take 2 capsules (1,000 mg total) by mouth 2 (two) times daily.    Dispense:  40 capsule    Refill:  0       Eliezer Lofts, MD

## 2021-08-18 ENCOUNTER — Ambulatory Visit: Payer: Medicare Other | Admitting: Family Medicine

## 2021-09-20 ENCOUNTER — Telehealth: Payer: Self-pay | Admitting: Family Medicine

## 2021-09-20 NOTE — Progress Notes (Signed)
°  Chronic Care Management   Outreach Note  09/20/2021 Name: Alex Huffman MRN: [id number] DOB: 07/07/1956  Referred by: Jinny Sanders, MD Reason for referral : No chief complaint on file.   An unsuccessful telephone outreach was attempted today. The patient was referred to the pharmacist for assistance with care management and care coordination.   Follow Up Plan:   Tatjana Dellinger Upstream Scheduler

## 2021-09-25 ENCOUNTER — Telehealth: Payer: Self-pay | Admitting: Family Medicine

## 2021-09-25 DIAGNOSIS — J0100 Acute maxillary sinusitis, unspecified: Secondary | ICD-10-CM | POA: Insufficient documentation

## 2021-09-25 NOTE — Assessment & Plan Note (Signed)
Start nasal saline irrigation/spray. Wear a mask during wood working. Start Amoxicillin x 10 days.  Can use prescription cough suppressant at night. C all if not improving as expected.

## 2021-09-25 NOTE — Chronic Care Management (AMB) (Signed)
°  Chronic Care Management   Outreach Note  09/25/2021 Name: KYDEN POTASH MRN: [id number] DOB: Oct 27, 1955  Referred by: Jinny Sanders, MD Reason for referral : No chief complaint on file.   A second unsuccessful telephone outreach was attempted today. The patient was referred to pharmacist for assistance with care management and care coordination.  Follow Up Plan:   Tatjana Dellinger Upstream Scheduler

## 2021-09-27 ENCOUNTER — Telehealth: Payer: Self-pay | Admitting: Family Medicine

## 2021-09-27 NOTE — Chronic Care Management (AMB) (Signed)
°  Chronic Care Management   Outreach Note  09/27/2021 Name: Alex Huffman MRN: [id number] DOB: Dec 20, 1955  Referred by: Jinny Sanders, MD Reason for referral : No chief complaint on file.   Third unsuccessful telephone outreach was attempted today. The patient was referred to the pharmacist for assistance with care management and care coordination.   Follow Up Plan:   Tatjana Dellinger Upstream Scheduler

## 2021-10-22 ENCOUNTER — Other Ambulatory Visit: Payer: Self-pay | Admitting: Family Medicine

## 2021-10-22 DIAGNOSIS — E039 Hypothyroidism, unspecified: Secondary | ICD-10-CM

## 2021-10-22 NOTE — Telephone Encounter (Signed)
Please schedule Medicare Wellness with Nurse and CPE with fasting labs with Dr. Diona Browner.

## 2021-10-23 NOTE — Telephone Encounter (Signed)
LMTCB to schedule.

## 2021-10-25 NOTE — Telephone Encounter (Signed)
Lvmtcb to schedule CPE

## 2021-10-31 ENCOUNTER — Other Ambulatory Visit: Payer: Self-pay

## 2021-10-31 ENCOUNTER — Encounter: Payer: Self-pay | Admitting: Family Medicine

## 2021-10-31 ENCOUNTER — Ambulatory Visit (INDEPENDENT_AMBULATORY_CARE_PROVIDER_SITE_OTHER): Payer: Medicare Other | Admitting: Family Medicine

## 2021-10-31 VITALS — BP 130/76 | HR 61 | Temp 98.1°F | Ht 66.5 in | Wt 189.5 lb

## 2021-10-31 DIAGNOSIS — Z0000 Encounter for general adult medical examination without abnormal findings: Secondary | ICD-10-CM

## 2021-10-31 DIAGNOSIS — I480 Paroxysmal atrial fibrillation: Secondary | ICD-10-CM

## 2021-10-31 DIAGNOSIS — E039 Hypothyroidism, unspecified: Secondary | ICD-10-CM | POA: Diagnosis not present

## 2021-10-31 DIAGNOSIS — Z125 Encounter for screening for malignant neoplasm of prostate: Secondary | ICD-10-CM | POA: Diagnosis not present

## 2021-10-31 DIAGNOSIS — Z13220 Encounter for screening for lipoid disorders: Secondary | ICD-10-CM

## 2021-10-31 DIAGNOSIS — Z131 Encounter for screening for diabetes mellitus: Secondary | ICD-10-CM

## 2021-10-31 LAB — COMPREHENSIVE METABOLIC PANEL
ALT: 38 U/L (ref 0–53)
AST: 25 U/L (ref 0–37)
Albumin: 4.3 g/dL (ref 3.5–5.2)
Alkaline Phosphatase: 55 U/L (ref 39–117)
BUN: 25 mg/dL — ABNORMAL HIGH (ref 6–23)
CO2: 28 mEq/L (ref 19–32)
Calcium: 9.2 mg/dL (ref 8.4–10.5)
Chloride: 105 mEq/L (ref 96–112)
Creatinine, Ser: 1.1 mg/dL (ref 0.40–1.50)
GFR: 69.35 mL/min (ref >60.00)
Glucose, Bld: 91 mg/dL (ref 70–99)
Potassium: 4.4 mEq/L (ref 3.5–5.1)
Sodium: 140 mEq/L (ref 135–145)
Total Bilirubin: 0.6 mg/dL (ref 0.2–1.2)
Total Protein: 6.7 g/dL (ref 6.0–8.3)

## 2021-10-31 LAB — LIPID PANEL
Cholesterol: 268 mg/dL — ABNORMAL HIGH (ref 0–200)
HDL: 46.1 mg/dL (ref >39.00)
NonHDL: 222.22
Total CHOL/HDL Ratio: 6
Triglycerides: 316 mg/dL — ABNORMAL HIGH (ref 0.0–149.0)
VLDL: 63.2 mg/dL — ABNORMAL HIGH (ref 0.0–40.0)

## 2021-10-31 LAB — PSA, MEDICARE: PSA: 4.95 ng/ml — ABNORMAL HIGH (ref 0.10–4.00)

## 2021-10-31 LAB — TSH: TSH: 4.64 u[IU]/mL (ref 0.35–5.50)

## 2021-10-31 LAB — LDL CHOLESTEROL, DIRECT: Direct LDL: 79 mg/dL

## 2021-10-31 LAB — T4, FREE: Free T4: 0.77 ng/dL (ref 0.60–1.60)

## 2021-10-31 LAB — T3, FREE: T3, Free: 3 pg/mL (ref 2.3–4.2)

## 2021-10-31 NOTE — Assessment & Plan Note (Signed)
Due for re-eval. 

## 2021-10-31 NOTE — Patient Instructions (Addendum)
Please stop at the lab to have labs drawn. Keep working on healthy eating and regular exercise.  Follow up if pain s/sp procedure in right groin not improving for consideration of further eval with US/CT.

## 2021-10-31 NOTE — Progress Notes (Signed)
Patient ID: Alex Huffman, male    DOB: January 08, 1956, 66 y.o.   MRN: [id number]  This visit was conducted in person.  BP 130/76    Pulse 61    Temp 98.1 F (36.7 C) (Temporal)    Ht 5' 6.5" (1.689 m)    Wt 189 lb 8 oz (86 kg)    SpO2 96%    BMI 30.13 kg/m    CC: Chief Complaint  Patient presents with   Medicare Wellness    Subjective:   HPI: Alex Huffman is a 66 y.o. male presenting on 10/31/2021 for Medicare Wellness  The patient presents for annual medicare wellness, complete physical and review of chronic health problems. He/She also has the following acute concerns today: none  I have personally reviewed the Medicare Annual Wellness questionnaire and have noted 1. The patient's medical and social history 2. Their use of alcohol, tobacco or illicit drugs 3. Their current medications and supplements 4. The patient's functional ability including ADL's, fall risks, home safety risks and hearing or visual             impairment. 5. Diet and physical activities 6. Evidence for depression or mood disorders 7.         Updated provider list Cognitive evaluation was performed and recorded on pt medicare questionnaire form. The patients weight, height, BMI and visual acuity have been recorded in the chart   I have made referrals, counseling and provided education to the patient based review of the above and I have provided the pt with a written personalized care plan for preventive services.   Documentation of this information was scanned into the electronic record under the media tab.   Advance directives and end of life planning reviewed in detail with patient and documented in EMR. Patient given handout on advance care directives if needed. HCPOA and living will updated if needed.  Hearing Screening  Method: Audiometry   500Hz  1000Hz  2000Hz  4000Hz   Right ear 20 20 20 20   Left ear 20 20 20 20   Vision Screening - Comments:: Yearly Eye Exam Dr. Cordelia Pen Fall 2022 [facility])  Fall Risk 10/19/2020 10/20/2020 10/20/2020 10/21/2020 10/31/2021  Falls in the past year? - - - - 0  Was there an injury with Fall? - - - - -  Fall Risk Category Calculator - - - - -  Fall Risk Category - - - - -  Patient Fall Risk Level Low fall risk Low fall risk Low fall risk Low fall risk -  Patient at Risk for Falls Due to - - - - -  Fall risk Follow up - - - - -    [place] Office Visit from 10/31/2021 in [place] [facility] Total Score 0       Paroxsysmal Atrial fibrillation/CHF: followed by Cardiology. Reviewed last OV from [facility] Electrophysiology 06/2021   No recurrences s/p cardioversion 05/2021  Today..Euvolemic, on  Eliquis for anticoagulation  Hypertension:    Off metoprolol. Good control on current regimen  BP Readings from Last 3 Encounters:  10/31/21 130/76  08/17/21 130/80  12/30/20 108/74  Using medication without problems or lightheadedness:  none Chest pain with exertion: none Edema:none Short of breath: none Average home BPs:  not checking recently. Other issues:  Hypothyroid  Due for re-eval. Levo 25 mcg  Lab Results  Component Value Date   TSH 3.387 10/18/2020   Cholesterol.. due for re-eval.   Patient Care  Team: Jinny Sanders, MD as PCP - General (Family Medicine) Nahser, Wonda Cheng, MD as PCP - Cardiology (Cardiology)   Relevant past medical, surgical, family and social history reviewed and updated as indicated. Interim medical history since our last visit reviewed. Allergies and medications reviewed and updated. Outpatient Medications Prior to Visit  Medication Sig Dispense Refill   apixaban (ELIQUIS) 5 MG TABS tablet Take 1 tablet (5 mg total) by mouth 2 (two) times daily. 60 tablet 5   levothyroxine (SYNTHROID) 25 MCG tablet Take 1 tablet by mouth once daily 90 tablet 0   amoxicillin (AMOXIL) 500 MG capsule Take 2 capsules (1,000 mg total) by mouth 2 (two) times daily. 40 capsule 0   HYDROcodone  bit-homatropine (HYCODAN) 5-1.5 MG/5ML syrup Take 5 mLs by mouth at bedtime as needed for cough. 120 mL 0   neomycin-polymyxin b-dexamethasone (MAXITROL) 3.5-10000-0.1 SUSP SMARTSIG:2 Drop(s) In Eye(s) Every 6 Hours     No facility-administered medications prior to visit.     Per HPI unless specifically indicated in ROS section below Review of Systems Objective:  BP 130/76    Pulse 61    Temp 98.1 F (36.7 C) (Temporal)    Ht 5' 6.5" (1.689 m)    Wt 189 lb 8 oz (86 kg)    SpO2 96%    BMI 30.13 kg/m   Wt Readings from Last 3 Encounters:  10/31/21 189 lb 8 oz (86 kg)  08/17/21 185 lb 6 oz (84.1 kg)  12/30/20 174 lb (78.9 kg)      Physical Exam    Results for orders placed or performed in visit on 10/31/20  PSA, total and free  Result Value Ref Range   PSA, Total 4.3 (H) < OR = 4.0 ng/mL   PSA, Free 0.8 ng/mL   PSA, % Free 19 (L) >25 % (calc)    This visit occurred during the SARS-CoV-2 public health emergency.  Safety protocols were in place, including screening questions prior to the visit, additional usage of staff PPE, and extensive cleaning of exam room while observing appropriate contact time as indicated for disinfecting solutions.   COVID 19 screen:  No recent travel or known exposure to COVID19 The patient denies respiratory symptoms of COVID 19 at this time. The importance of social distancing was discussed today.   Assessment and Plan The patient's preventative maintenance and recommended screening tests for an annual wellness exam were reviewed in full today. Brought up to date unless services declined.  Counselled on the importance of diet, exercise, and its role in overall health and mortality. The patient's FH and SH was reviewed, including their home life, tobacco status, and drug and alcohol status.     Vaccines: COVID x 1 JJ, refused flu (refuses), Td,  uptodate with shingrix Prostate Cancer Screen: due Colon Cancer Screen:  2019 Dr. Carlean Purl,  repeat due in  10 years      Smoking Status: none ETOH/ drug use: none/none Hep C:  done    Problem List Items Addressed This Visit     Hypothyroidism    Due for re-eval.      Relevant Orders   TSH   T4, free   T3, free   Paroxysmal atrial fibrillation ([place])    No recurrences s/p cardioversion 05/2021.  followed by cardiology.      Other Visit Diagnoses     Medicare annual wellness visit, subsequent    -  Primary   Prostate cancer screening  Relevant Orders   PSA, Medicare   Screening cholesterol level       Relevant Orders   Lipid panel   Comprehensive metabolic panel   Screening for diabetes mellitus               Eliezer Lofts, MD

## 2021-10-31 NOTE — Assessment & Plan Note (Signed)
No recurrences s/p cardioversion 05/2021.  followed by cardiology.

## 2021-11-04 ENCOUNTER — Encounter: Payer: Self-pay | Admitting: Family Medicine

## 2021-11-26 ENCOUNTER — Encounter: Payer: Self-pay | Admitting: Family Medicine

## 2021-11-26 ENCOUNTER — Other Ambulatory Visit: Payer: Self-pay | Admitting: Family Medicine

## 2021-11-26 DIAGNOSIS — E039 Hypothyroidism, unspecified: Secondary | ICD-10-CM

## 2021-11-27 ENCOUNTER — Other Ambulatory Visit: Payer: Self-pay | Admitting: Family Medicine

## 2021-11-27 DIAGNOSIS — E039 Hypothyroidism, unspecified: Secondary | ICD-10-CM

## 2021-11-27 MED ORDER — LEVOTHYROXINE SODIUM 25 MCG PO TABS: 25.0000 ug | ORAL_TABLET | Freq: Every day | ORAL | 1 refills | Status: DC

## 2021-11-27 NOTE — Telephone Encounter (Signed)
E-scribed refill 

## 2022-03-29 ENCOUNTER — Emergency Department
Admission: EM | Admit: 2022-03-29 | Discharge: 2022-03-29 | Disposition: A | Discharge status: Home / Self Care | Payer: Medicare Other | Attending: Emergency Medicine | Admitting: Emergency Medicine

## 2022-03-29 ENCOUNTER — Ambulatory Visit (INDEPENDENT_AMBULATORY_CARE_PROVIDER_SITE_OTHER): Payer: Medicare Other | Admitting: Family Medicine

## 2022-03-29 ENCOUNTER — Encounter: Payer: Self-pay | Admitting: Intensive Care

## 2022-03-29 ENCOUNTER — Other Ambulatory Visit: Payer: Self-pay

## 2022-03-29 ENCOUNTER — Encounter: Payer: Self-pay | Admitting: Family Medicine

## 2022-03-29 ENCOUNTER — Emergency Department: Payer: Medicare Other

## 2022-03-29 DIAGNOSIS — I10 Essential (primary) hypertension: Secondary | ICD-10-CM | POA: Diagnosis not present

## 2022-03-29 DIAGNOSIS — I480 Paroxysmal atrial fibrillation: Secondary | ICD-10-CM | POA: Insufficient documentation

## 2022-03-29 DIAGNOSIS — K5792 Diverticulitis of intestine, part unspecified, without perforation or abscess without bleeding: Secondary | ICD-10-CM

## 2022-03-29 DIAGNOSIS — I5032 Chronic diastolic (congestive) heart failure: Secondary | ICD-10-CM | POA: Insufficient documentation

## 2022-03-29 DIAGNOSIS — R1031 Right lower quadrant pain: Secondary | ICD-10-CM | POA: Diagnosis present

## 2022-03-29 DIAGNOSIS — Z8616 Personal history of COVID-19: Secondary | ICD-10-CM | POA: Diagnosis not present

## 2022-03-29 DIAGNOSIS — K5790 Diverticulosis of intestine, part unspecified, without perforation or abscess without bleeding: Secondary | ICD-10-CM | POA: Insufficient documentation

## 2022-03-29 DIAGNOSIS — E039 Hypothyroidism, unspecified: Secondary | ICD-10-CM | POA: Insufficient documentation

## 2022-03-29 HISTORY — DX: Unspecified atrial fibrillation: I48.91

## 2022-03-29 LAB — COMPREHENSIVE METABOLIC PANEL
ALT: 32 U/L (ref 0–44)
AST: 29 U/L (ref 15–41)
Albumin: 4.3 g/dL (ref 3.5–5.0)
Alkaline Phosphatase: 55 U/L (ref 38–126)
Anion gap: 8 (ref 5–15)
BUN: 18 mg/dL (ref 8–23)
CO2: 24 mmol/L (ref 22–32)
Calcium: 9.2 mg/dL (ref 8.9–10.3)
Chloride: 106 mmol/L (ref 98–111)
Creatinine, Ser: 1.15 mg/dL (ref 0.61–1.24)
GFR, Estimated: >60 mL/min (ref >60)
Glucose, Bld: 114 mg/dL — ABNORMAL HIGH (ref 70–99)
Potassium: 4.3 mmol/L (ref 3.5–5.1)
Sodium: 138 mmol/L (ref 135–145)
Total Bilirubin: 1.7 mg/dL — ABNORMAL HIGH (ref 0.3–1.2)
Total Protein: 7.4 g/dL (ref 6.5–8.1)

## 2022-03-29 LAB — URINALYSIS, ROUTINE W REFLEX MICROSCOPIC
Bilirubin Urine: NEGATIVE
Glucose, UA: NEGATIVE mg/dL
Ketones, ur: 5 mg/dL — AB
Leukocytes,Ua: NEGATIVE
Nitrite: NEGATIVE
Protein, ur: NEGATIVE mg/dL
Specific Gravity, Urine: 1.023 (ref 1.005–1.030)
Squamous Epithelial / HPF: NONE SEEN (ref 0–5)
pH: 5 (ref 5.0–8.0)

## 2022-03-29 LAB — CBC
HCT: 43.3 % (ref 39.0–52.0)
Hemoglobin: 13.9 g/dL (ref 13.0–17.0)
MCH: 28.6 pg (ref 26.0–34.0)
MCHC: 32.1 g/dL (ref 30.0–36.0)
MCV: 89.1 fL (ref 80.0–100.0)
Platelets: 231 10*3/uL (ref 150–400)
RBC: 4.86 MIL/uL (ref 4.22–5.81)
RDW: 12.6 % (ref 11.5–15.5)
WBC: 11.5 10*3/uL — ABNORMAL HIGH (ref 4.0–10.5)
nRBC: 0 % (ref 0.0–0.2)

## 2022-03-29 LAB — LIPASE, BLOOD: Lipase: 24 U/L (ref 11–51)

## 2022-03-29 MED ORDER — IOHEXOL 300 MG/ML  SOLN
100.0000 mL | Freq: Once | INTRAMUSCULAR | Status: AC | PRN
  Administered 2022-03-29: 100 mL via INTRAVENOUS

## 2022-03-29 MED ORDER — AMOXICILLIN-POT CLAVULANATE 875-125 MG PO TABS: 1.0000 | ORAL_TABLET | Freq: Two times a day (BID) | ORAL | 0 refills | Status: AC

## 2022-03-29 NOTE — Patient Instructions (Addendum)
Please head in to the ER for urgent evaluation of your abdominal pain   Please go to [facility] now    If suddenly worsen-please call 911

## 2022-03-29 NOTE — Discharge Instructions (Addendum)
Your CAT scan shows diverticulitis.  Please take the antibiotics as prescribed.  Please follow-up with your outpatient provider.  Please return for any new, worsening, or change in symptoms or other concerns.  It was a pleasure caring for you today.

## 2022-03-29 NOTE — Assessment & Plan Note (Signed)
1 month of dull R sided abd pain now much more severe making difficult to sit  Also diarrhea and nausea with low grade temp Rebound tenderness on exam  Disc need for stat labs and imaging to start and pt was anxious about this (appendicitis is in the differential)  He preferred to go to ER for further evaluation  Sent to [facility] by car

## 2022-03-29 NOTE — ED Notes (Signed)
Provider talked further with pt who then stated he thinks he can make it through CT without anxiety med.

## 2022-03-29 NOTE — ED Triage Notes (Addendum)
Patient sent by pcp for abdominal pain with concerns of appendicitis. C/o RLQ pain with diarrhea

## 2022-03-29 NOTE — ED Provider Notes (Signed)
[facility] Provider Note    Event Date/Time   First MD Initiated Contact with Patient 03/29/22 1328     (approximate)   History   Abdominal Pain   HPI  Alex Huffman is a 66 y.o. male with a past medical history of atrial fibrillation, BPA, TIA who presents today for evaluation of abdominal pain.  Patient reports that his pain was intermittent for the past several weeks, and then became constant 6 days ago.  He reports that it is periumbilical, right lower quadrant, and right upper quadrant.  He does not notice any difference with eating.  He reports that he had temperature of 99.1 F.  He reports that he has had 2 episodes of diarrhea.  No vomiting, though he does report feeling nauseous.  No chest pain or back pain.  He has not taken anything for his symptoms.  Patient Active Problem List   Diagnosis Date Noted   Hypothyroidism 12/12/2020   Secondary hypercoagulable state (HCC) 11/03/2020   Left arm pain 11/01/2020   Cellulitis of left upper extremity 11/01/2020   Chronic diastolic congestive heart failure (HCC) 10/27/2020   History of TIAs 10/27/2020   Primary hypertension    Paroxysmal atrial fibrillation (HCC) 10/18/2020   COVID-19    Combined forms of age-related cataract of both eyes 12/06/2016   Bradycardia 05/09/2016   Herniated lumbar disc without myelopathy 10/21/2014   Benign localized prostatic hyperplasia with lower urinary tract symptoms (LUTS) 10/15/2012   Chronic prostatitis 10/15/2012   ED (erectile dysfunction) of organic origin 10/15/2012   Elevated prostate specific antigen (PSA) 10/15/2012   Incomplete emptying of bladder 10/15/2012   Personal history of colonic polyps 06/15/2011   Hemorrhoids, internal, with bleeding 05/28/2011          Physical Exam   Triage Vital Signs: ED Triage Vitals  Enc Vitals Group     BP 03/29/22 1238 (!) 146/93     Pulse Rate 03/29/22 1238 76     Resp 03/29/22 1238 16     Temp 03/29/22  1244 98 F (36.7 C)     Temp Source 03/29/22 1238 Oral     SpO2 03/29/22 1238 95 %     Weight 03/29/22 1237 182 lb (82.6 kg)     Height 03/29/22 1237 5' 6.5" (1.689 m)     Head Circumference --      Peak Flow --      Pain Score 03/29/22 1240 8     Pain Loc --      Pain Edu? --      Excl. in GC? --     Most recent vital signs: Vitals:   03/29/22 1238 03/29/22 1244  BP: (!) 146/93   Pulse: 76   Resp: 16   Temp:  98 F (36.7 C)  SpO2: 95%     Physical Exam Vitals and nursing note reviewed.  Constitutional:      General: Awake and alert. No acute distress.    Appearance: Normal appearance. The patient is normal weight.  HENT:     Head: Normocephalic and atraumatic.     Mouth: Mucous membranes are moist.  Eyes:     General: PERRL. Normal EOMs        Right eye: No discharge.        Left eye: No discharge.     Conjunctiva/sclera: Conjunctivae normal.  Cardiovascular:     Rate and Rhythm: Normal rate and regular rhythm.     Pulses:  Normal pulses.     Heart sounds: Normal heart sounds Pulmonary:     Effort: Pulmonary effort is normal. No respiratory distress.     Breath sounds: Normal breath sounds.  Abdominal:     Abdomen is soft. There is periumbilical, right upper quadrant, and right lower quadrant abdominal tenderness.  No distention. Musculoskeletal:        General: No swelling. Normal range of motion.     Cervical back: Normal range of motion and neck supple.  Skin:    General: Skin is warm and dry.     Capillary Refill: Capillary refill takes less than 2 seconds.     Findings: No rash.  Neurological:     Mental Status: The patient is awake and alert.      ED Results / Procedures / Treatments   Labs (all labs ordered are listed, but only abnormal results are displayed) Labs Reviewed  COMPREHENSIVE METABOLIC PANEL - Abnormal; Notable for the following components:      Result Value   Glucose, Bld 114 (*)    Total Bilirubin 1.7 (*)    All other components  within normal limits  CBC - Abnormal; Notable for the following components:   WBC 11.5 (*)    All other components within normal limits  URINALYSIS, ROUTINE W REFLEX MICROSCOPIC - Abnormal; Notable for the following components:   Color, Urine YELLOW (*)    APPearance CLEAR (*)    Hgb urine dipstick SMALL (*)    Ketones, ur 5 (*)    Bacteria, UA RARE (*)    All other components within normal limits  LIPASE, BLOOD     EKG     RADIOLOGY I independently reviewed and interpreted imaging and agree with radiologists findings.     PROCEDURES:  Critical Care performed:   Procedures   MEDICATIONS ORDERED IN ED: Medications  iohexol (OMNIPAQUE) 300 MG/ML solution 100 mL (100 mLs Intravenous Contrast Given 03/29/22 1423)     IMPRESSION / MDM / ASSESSMENT AND PLAN / ED COURSE  I reviewed the triage vital signs and the nursing notes.   Differential diagnosis includes, but is not limited to, appendicitis, diverticulitis, biliary disease/cholecystitis, pancreatitis.  Patient is awake and alert, hemodynamically stable and afebrile.  Labs are obtained which are overall reassuring.  However patient has diffuse abdominal tenderness.  CT scan obtained and demonstrates uncomplicated diverticulitis.  No abscess or perforation.  He was found to have cholelithiasis without gallbladder wall thickening, no CBD dilatation.  Patient is reassured by these findings.  He was started on oral antibiotics.  We discussed the importance of close outpatient follow-up and return precautions.  Patient understands and agrees with plan.  Discharged in stable condition.   Patient's presentation is most consistent with acute complicated illness / injury requiring diagnostic workup.       FINAL CLINICAL IMPRESSION(S) / ED DIAGNOSES   Final diagnoses:  Diverticulitis     Rx / DC Orders   ED Discharge Orders          Ordered    amoxicillin-clavulanate (AUGMENTIN) 875-125 MG tablet  2 times daily         03/29/22 1507             Note:  This document was prepared using Dragon voice recognition software and may include unintentional dictation errors.   Keturah Shavers 03/29/22 1523    Chesley Noon, MD 04/02/22 [phone]

## 2022-03-29 NOTE — ED Notes (Signed)
See triage note. Pt reports pain in upper medial abdomen that radiates into RLQ. Pt reports tenderness and that pain increases once pressure release from palpating site. Pt reports has had issues with diarrhea since last Saturday. Reports "fever of 99 last night and today" per pt. Pt's resp reg/unlabored, skin dry, and calmly standing currently as states standing decreases his pain. Pt denies N/V. Pt reports will need something to help him get through CT as he "gets claustrophobic"; pt reports "black" stool; denies it being tarry.

## 2022-03-29 NOTE — Progress Notes (Signed)
Subjective:    Patient ID: Alex Huffman, male    DOB: 05-10-1956, 66 y.o.   MRN: [id number]  HPI 66 yo pt of Dr Diona Browner presents with abdominal cramping  Wt Readings from Last 3 Encounters:  03/29/22 184 lb 9.6 oz (83.7 kg)  10/31/21 189 lb 8 oz (86 kg)  08/17/21 185 lb 6 oz (84.1 kg)   29.35 kg/m    1 month - getting gradually worse  Nausea -no vomiting  Diarrhea once daily (worse lately)  No blood that he can tell , not 100% sure  Some explosive bm with liquid stool  Sometimes feels urge for bm and cannot go  Lot of gurgling  Not eating much - eating makes it worse so he avoids  Boiled egg and 2 crackers   Worse to sit down     Low grade temp   Pain in center and going to the R Sharp pain , with some pressure  Goes to the back a bit  Goes to rectal area     Some prostate enlargement in past  Has irritation occ at start or urination  Occ stream is weak     H pylori neg in 2020  Had gallstones in the past without symptoms  (has had his gallbladder out)  Still has his appendix and gall bladder  Treated for h pylori in the past    Erosive gastropathy  He is not sure  Has never had ulcers    Interested in psa test  Worried about cancer    Patient Active Problem List   Diagnosis Date Noted   Hypothyroidism 12/12/2020   Secondary hypercoagulable state ([place]) 11/03/2020   Left arm pain 11/01/2020   Cellulitis of left upper extremity 11/01/2020   Chronic diastolic congestive heart failure ([place]) 10/27/2020   History of TIAs 10/27/2020   Primary hypertension    Paroxysmal atrial fibrillation ([place]) 10/18/2020   COVID-19    Combined forms of age-related cataract of both eyes 12/06/2016   Bradycardia 05/09/2016   Herniated lumbar disc without myelopathy 10/21/2014   Benign localized prostatic hyperplasia with lower urinary tract symptoms (LUTS) 10/15/2012   Chronic prostatitis 10/15/2012   ED (erectile dysfunction) of organic origin 10/15/2012    Elevated prostate specific antigen (PSA) 10/15/2012   Incomplete emptying of bladder 10/15/2012   Personal history of colonic polyps 06/15/2011   Hemorrhoids, internal, with bleeding 05/28/2011   Past Medical History:  Diagnosis Date   Anxiety    Atrial fibrillation ([place])    Cataract    Chronic headaches    Helicobacter pylori gastritis 2009   EGD   Hyperlipemia    Personal history of colonic polyps    Past Surgical History:  Procedure Laterality Date   CARDIOVERSION N/A 11/09/2020   Procedure: CARDIOVERSION;  Surgeon: Geralynn Rile, MD;  Location: [facility];  Service: Cardiovascular;  Laterality: N/A;   COLONOSCOPY  , 06/08/2011   2009: two 3 mm hyperplastic polyps, lipoma, internal hemorrjhoids 2012: 46m cecal serrated adenoma, lipoma, internal hemorrhoids   HERNIA REPAIR  05/04   ROTATOR CUFF REPAIR  05/06   UPPER GASTROINTESTINAL ENDOSCOPY  02/02/2008   erosive gastropathy   Social History   Tobacco Use   Smoking status: Never   Smokeless tobacco: Never  Vaping Use   Vaping Use: Never used  Substance Use Topics   Alcohol use: Not Currently    Comment: Occasional beer   Drug use: No   Family History  Problem Relation  Age of Onset   Heart disease Father    Celiac disease Mother    Colon cancer Neg Hx    Esophageal cancer Neg Hx    Liver cancer Neg Hx    Pancreatic cancer Neg Hx    Rectal cancer Neg Hx    Stomach cancer Neg Hx    Allergic rhinitis Neg Hx    Allergies  Allergen Reactions   Typhoid Vaccine, Live Other (See Comments) and Anaphylaxis   Typhoid Vi Polysaccharide Vaccine Anaphylaxis and Other (See Comments)    Comatose, almost died    Amlodipine Other (See Comments)    Leg pain and fatigue   Atorvastatin Diarrhea    Body ache   Doxycycline Other (See Comments)    Unknown reaction   Nitrofurantoin Other (See Comments) and Nausea And Vomiting   Sulfa Antibiotics Hives   Current Outpatient Medications on File Prior to Visit   Medication Sig Dispense Refill   apixaban (ELIQUIS) 5 MG TABS tablet Take 1 tablet (5 mg total) by mouth 2 (two) times daily. 60 tablet 5   levothyroxine (SYNTHROID) 25 MCG tablet Take 1 tablet (25 mcg total) by mouth daily. 90 tablet 1   [DISCONTINUED] atorvastatin (LIPITOR) 40 MG tablet Take 1 tablet (40 mg total) by mouth daily. 30 tablet 0   No current facility-administered medications on file prior to visit.      Review of Systems  Constitutional:  Negative for activity change, appetite change, fatigue, fever and unexpected weight change.  HENT:  Negative for congestion, rhinorrhea, sore throat and trouble swallowing.   Eyes:  Negative for pain, redness, itching and visual disturbance.  Respiratory:  Negative for cough, chest tightness, shortness of breath and wheezing.   Cardiovascular:  Negative for chest pain and palpitations.  Gastrointestinal:  Positive for abdominal pain, diarrhea and rectal pain. Negative for abdominal distention, blood in stool, constipation, nausea and vomiting.  Endocrine: Negative for cold intolerance, heat intolerance, polydipsia and polyuria.  Genitourinary:  Negative for difficulty urinating, dysuria, frequency and urgency.  Musculoskeletal:  Negative for arthralgias, joint swelling and myalgias.  Skin:  Negative for pallor and rash.  Neurological:  Negative for dizziness, tremors, weakness, numbness and headaches.  Hematological:  Negative for adenopathy. Does not bruise/bleed easily.  Psychiatric/Behavioral:  Negative for decreased concentration and dysphoric mood. The patient is not nervous/anxious.        Objective:   Physical Exam Constitutional:      General: He is not in acute distress.    Appearance: He is well-developed and normal weight. He is not ill-appearing or diaphoretic.     Comments: Pt is standing, very uncomfortable to lie down  HENT:     Head: Normocephalic and atraumatic.     Mouth/Throat:     Mouth: Mucous membranes are  moist.  Eyes:     General: No scleral icterus.    Conjunctiva/sclera: Conjunctivae normal.     Pupils: Pupils are equal, round, and reactive to light.  Cardiovascular:     Rate and Rhythm: Normal rate and regular rhythm.     Heart sounds: Normal heart sounds.  Pulmonary:     Effort: Pulmonary effort is normal. No respiratory distress.     Breath sounds: Normal breath sounds. No wheezing or rales.  Chest:     Chest wall: No tenderness.  Abdominal:     General: Abdomen is flat. Bowel sounds are normal. There is no distension or abdominal bruit.     Palpations: Abdomen is soft.  There is no hepatomegaly, splenomegaly, mass or pulsatile mass.     Tenderness: There is abdominal tenderness in the right upper quadrant, right lower quadrant, epigastric area and periumbilical area. There is rebound. There is no right CVA tenderness or guarding. Positive signs include Murphy's sign and McBurney's sign.     Hernia: No hernia is present.     Comments: Abd tenderness on R with rebound but no guarding     Musculoskeletal:     Cervical back: Neck supple.  Lymphadenopathy:     Cervical: No cervical adenopathy.  Skin:    General: Skin is warm and dry.     Coloration: Skin is not jaundiced or pale.     Findings: No erythema.  Neurological:     Mental Status: He is alert.  Psychiatric:        Mood and Affect: Mood normal.     Comments: Mildly anxious            Assessment & Plan:   Problem List Items Addressed This Visit       Other   Abdominal pain, right lower quadrant    1 month of dull R sided abd pain now much more severe making difficult to sit  Also diarrhea and nausea with low grade temp Rebound tenderness on exam  Disc need for stat labs and imaging to start and pt was anxious about this (appendicitis is in the differential)  He preferred to go to ER for further evaluation  Sent to [facility] by car

## 2022-03-30 ENCOUNTER — Encounter: Payer: Self-pay | Admitting: Family Medicine

## 2022-03-31 ENCOUNTER — Encounter: Payer: Self-pay | Admitting: Family Medicine

## 2022-04-02 ENCOUNTER — Other Ambulatory Visit: Payer: Self-pay

## 2022-04-02 ENCOUNTER — Emergency Department
Admission: EM | Admit: 2022-04-02 | Discharge: 2022-04-02 | Disposition: A | Discharge status: Home / Self Care | Payer: Medicare Other | Attending: Emergency Medicine | Admitting: Emergency Medicine

## 2022-04-02 ENCOUNTER — Encounter: Payer: Self-pay | Admitting: Emergency Medicine

## 2022-04-02 DIAGNOSIS — Z7901 Long term (current) use of anticoagulants: Secondary | ICD-10-CM | POA: Diagnosis not present

## 2022-04-02 DIAGNOSIS — I480 Paroxysmal atrial fibrillation: Secondary | ICD-10-CM | POA: Diagnosis not present

## 2022-04-02 DIAGNOSIS — S51811A Laceration without foreign body of right forearm, initial encounter: Secondary | ICD-10-CM | POA: Diagnosis not present

## 2022-04-02 DIAGNOSIS — W312XXA Contact with powered woodworking and forming machines, initial encounter: Secondary | ICD-10-CM | POA: Insufficient documentation

## 2022-04-02 DIAGNOSIS — S59911A Unspecified injury of right forearm, initial encounter: Secondary | ICD-10-CM | POA: Diagnosis present

## 2022-04-02 DIAGNOSIS — Z23 Encounter for immunization: Secondary | ICD-10-CM | POA: Diagnosis not present

## 2022-04-02 MED ORDER — ACETAMINOPHEN 500 MG PO TABS
1000.0000 mg | ORAL_TABLET | Freq: Once | ORAL | Status: AC
  Administered 2022-04-02: 1000 mg via ORAL
  Filled 2022-04-02: qty 2

## 2022-04-02 MED ORDER — LIDOCAINE-EPINEPHRINE 2 %-1:100000 IJ SOLN
20.0000 mL | Freq: Once | INTRAMUSCULAR | Status: AC
Start: 2022-04-02 — End: 2022-04-02
  Administered 2022-04-02: 20 mL
  Filled 2022-04-02: qty 1

## 2022-04-02 MED ORDER — TETANUS-DIPHTH-ACELL PERTUSSIS 5-2.5-18.5 LF-MCG/0.5 IM SUSY
0.5000 mL | PREFILLED_SYRINGE | Freq: Once | INTRAMUSCULAR | Status: AC
Start: 2022-04-02 — End: 2022-04-02
  Administered 2022-04-02: 0.5 mL via INTRAMUSCULAR
  Filled 2022-04-02: qty 0.5

## 2022-04-02 MED ORDER — BACITRACIN ZINC 500 UNIT/GM EX OINT
TOPICAL_OINTMENT | Freq: Once | CUTANEOUS | Status: AC
  Filled 2022-04-02: qty 0.9

## 2022-04-02 NOTE — ED Provider Notes (Signed)
[facility] Provider Note    Event Date/Time   First MD Initiated Contact with Patient 04/02/22 1344     (approximate)   History   Laceration   HPI  Alex Huffman is a 66 y.o. male who presents to the ED for evaluation of Laceration   Review PCP visit from 4 days ago.  Anticoagulant on Eliquis due to paroxysmal A-fib.  Patient presents to the ED for evaluation of an accidental laceration to the right forearm from a hand saw.  Reports sawing a dowel with his left hand with a flexible Japanese hand saw when he accidentally follow-through too much striking his volar right forearm causing a longitudinal laceration for which he presents to the ED.   Physical Exam   Triage Vital Signs: ED Triage Vitals [04/02/22 1222]  Enc Vitals Group     BP (!) 139/102     Pulse Rate 97     Resp 16     Temp 98 F (36.7 C)     Temp Source Oral     SpO2 99 %     Weight 180 lb 12.4 oz (82 kg)     Height 5\' 6"  (1.676 m)     Head Circumference      Peak Flow      Pain Score 6     Pain Loc      Pain Edu?      Excl. in GC?     Most recent vital signs: Vitals:   04/02/22 1222  BP: (!) 139/102  Pulse: 97  Resp: 16  Temp: 98 F (36.7 C)  SpO2: 99%    General: Awake, no distress.  CV:  Good peripheral perfusion.  Resp:  Normal effort.  Abd:  No distention.  MSK:  Longitudinal laceration to the volar right forearm about 10 cm in length down to the subcutaneous tissue.  No muscle belly visualized.  Slightly oozing dark blood, but hemostatic with direct pressure.  Full active and passive range of motion of wrist and fingers without impaired function.  Strong symmetric radial pulses, cap refill is brisk distally, strength and sensation intact. Neuro:  No focal deficits appreciated. Other:      ED Results / Procedures / Treatments   Labs (all labs ordered are listed, but only abnormal results are displayed) Labs Reviewed - No data to  display  EKG   RADIOLOGY   Official radiology report(s): No results found.  PROCEDURES and INTERVENTIONS:  .Marland KitchenLaceration Repair  Date/Time: 04/02/2022 3:16 PM  Performed by: Delton Prairie, MD Authorized by: Delton Prairie, MD   Consent:    Consent obtained:  Verbal   Consent given by:  Patient   Risks, benefits, and alternatives were discussed: yes   Anesthesia:    Anesthesia method:  Local infiltration   Local anesthetic:  Lidocaine 2% WITH epi Laceration details:    Location:  Shoulder/arm   Shoulder/arm location:  R lower arm   Length (cm):  10 Pre-procedure details:    Preparation:  Patient was prepped and draped in usual sterile fashion Exploration:    Hemostasis achieved with:  Direct pressure   Wound exploration: wound explored through full range of motion and entire depth of wound visualized     Contaminated: no   Treatment:    Area cleansed with:  Povidone-iodine   Amount of cleaning:  Extensive   Irrigation solution:  Sterile water and sterile saline   Irrigation volume:  500   Visualized  foreign bodies/material removed: no     Debridement:  None   Undermining:  None   Scar revision: no   Skin repair:    Repair method:  Sutures   Suture size:  3-0   Suture material:  Nylon   Suture technique:  Running locked   Number of sutures:  14 Approximation:    Approximation:  Close Repair type:    Repair type:  Intermediate Post-procedure details:    Dressing:  Antibiotic ointment and non-adherent dressing   Procedure completion:  Tolerated well, no immediate complications   Medications  bacitracin ointment (has no administration in time range)  lidocaine-EPINEPHrine (XYLOCAINE W/EPI) 2 %-1:100000 (with pres) injection 20 mL (20 mLs Infiltration Given by Other 04/02/22 1415)  acetaminophen (TYLENOL) tablet 1,000 mg (1,000 mg Oral Given 04/02/22 1414)  Tdap (BOOSTRIX) injection 0.5 mL (0.5 mLs Intramuscular Given 04/02/22 1414)     IMPRESSION / MDM /  ASSESSMENT AND PLAN / ED COURSE  I reviewed the triage vital signs and the nursing notes.  Differential diagnosis includes, but is not limited to, laceration, tendon disruption, arterial hemorrhage  {Patient presents with symptoms of an acute illness or injury that is potentially life-threatening.  Anticoagulated 66 year old male presents to the ED with an accidental laceration to his right volar forearm with a hand saw while woodworking, requiring bedside suture repair but suitable for outpatient management.  He looks systemically well.  Only injury appears to be a longitudinal laceration to the subcutaneous tissue as described above.  Thankfully no evidence of tenderness or muscle belly involvement, no impaired finger or hand range of motion.  Repaired with a single 14 suture running nonlocked stitch with close apposition.  We discussed wound care at home and return precautions.  Clinical Course as of 04/02/22 1518  Mon Apr 02, 2022  1500 Like a very well-tolerated.  We discussed wound care at home throughout this process as well as suture removal after 7-10 days. [DS]    Clinical Course User Index [DS] Delton Prairie, MD     FINAL CLINICAL IMPRESSION(S) / ED DIAGNOSES   Final diagnoses:  Laceration of right forearm, initial encounter  Anticoagulated     Rx / DC Orders   ED Discharge Orders     None        Note:  This document was prepared using Dragon voice recognition software and may include unintentional dictation errors.   Delton Prairie, MD 04/02/22 1520

## 2022-04-10 ENCOUNTER — Encounter: Payer: Self-pay | Admitting: Family Medicine

## 2022-04-11 MED ORDER — AMOXICILLIN-POT CLAVULANATE 875-125 MG PO TABS: 1.0000 | ORAL_TABLET | Freq: Two times a day (BID) | ORAL | 0 refills | Status: DC

## 2022-06-21 ENCOUNTER — Telehealth: Payer: Self-pay | Admitting: Family Medicine

## 2022-06-21 NOTE — Telephone Encounter (Signed)
Patient called in and stated he wants to have a PSA and urine analysis done. Please advise. Thank you!

## 2022-06-21 NOTE — Telephone Encounter (Signed)
Need a reason

## 2022-06-22 ENCOUNTER — Encounter: Payer: Self-pay | Admitting: *Deleted

## 2022-06-22 NOTE — Telephone Encounter (Addendum)
Left message for Alex Huffman asking that he call us back or send [place] a MyChart message letting us know the reason he is requesting the PSA and UA.  I also sent patient a MyChart message asking for reason for test.   Awaiting response.

## 2022-06-24 NOTE — Telephone Encounter (Signed)
See Patient's MyChart message.

## 2022-06-25 NOTE — Telephone Encounter (Signed)
It sounds to me like he needs an appointment to be seen ASAP.  Can you call to set this up?  Or send him to triage

## 2022-06-26 ENCOUNTER — Ambulatory Visit: Payer: Medicare Other | Admitting: Family Medicine

## 2022-06-28 ENCOUNTER — Ambulatory Visit (INDEPENDENT_AMBULATORY_CARE_PROVIDER_SITE_OTHER): Payer: Medicare Other | Admitting: Family Medicine

## 2022-06-28 VITALS — BP 158/102 | HR 58 | Temp 97.2°F | Resp 12 | Ht 66.5 in | Wt 182.0 lb

## 2022-06-28 DIAGNOSIS — R300 Dysuria: Secondary | ICD-10-CM | POA: Diagnosis not present

## 2022-06-28 DIAGNOSIS — N410 Acute prostatitis: Secondary | ICD-10-CM

## 2022-06-28 DIAGNOSIS — R1084 Generalized abdominal pain: Secondary | ICD-10-CM | POA: Diagnosis not present

## 2022-06-28 DIAGNOSIS — N401 Enlarged prostate with lower urinary tract symptoms: Secondary | ICD-10-CM | POA: Diagnosis not present

## 2022-06-28 DIAGNOSIS — R030 Elevated blood-pressure reading, without diagnosis of hypertension: Secondary | ICD-10-CM

## 2022-06-28 LAB — POC URINALSYSI DIPSTICK (AUTOMATED)
Bilirubin, UA: NEGATIVE
Blood, UA: NEGATIVE
Glucose, UA: NEGATIVE
Ketones, UA: NEGATIVE
Nitrite, UA: NEGATIVE
Protein, UA: NEGATIVE
Spec Grav, UA: 1.01 (ref 1.010–1.025)
Urobilinogen, UA: 0.2 E.U./dL
pH, UA: 5.5 (ref 5.0–8.0)

## 2022-06-28 MED ORDER — CIPROFLOXACIN HCL 500 MG PO TABS: 500.0000 mg | ORAL_TABLET | Freq: Two times a day (BID) | ORAL | 0 refills | Status: DC

## 2022-06-28 NOTE — Assessment & Plan Note (Signed)
Acute on possibly subacute/chronic symptoms Most consistent with current bacterial prostatitis.  No sign of urinary tract infection from bladder to kidney.

## 2022-06-28 NOTE — Assessment & Plan Note (Signed)
Last few blood pressure measurements have been elevated but have all been in setting of pain, laceration or new issue.  He states he may have some mild elevation at home.  He will follow his blood pressure and update me in 1 week.  He may need a medication to lower blood pressure.

## 2022-06-28 NOTE — Progress Notes (Signed)
Patient ID: Alex Huffman, male    DOB: 16-Jul-1956, 66 y.o.   MRN: [id number]  This visit was conducted in person.  BP (!) 158/102   Pulse (!) 58   Temp (!) 97.2 F (36.2 C) (Temporal)   Resp 12   Ht 5' 6.5" (1.689 m)   Wt 182 lb (82.6 kg)   SpO2 99%   BMI 28.94 kg/m    CC:  Chief Complaint  Patient presents with   Dysuria    X few weeks-Some burning with urination at times and then may stop, pressure in the groin area after sitting for a little while, some discomfort with urination, in the morning urine stream is weak and slower than normal. Next time he urinates stream is better.    Subjective:   HPI: Alex Huffman is a 66 y.o. male   with history of atrial fibrillation presenting on 06/28/2022 for Dysuria (X few weeks-Some burning with urination at times and then may stop, pressure in the groin area after sitting for a little while, some discomfort with urination, in the morning urine stream is weak and slower than normal. Next time he urinates stream is better.)   Ongoing for few week.. intermittent burning with urination.  Pressure in groin area in perineum and into penis,  notes weaker stream off and on.  No blood in urine.   Has been pushing fluids. 5-8 cups water a day.   No fever, no flank pain. Bilateral low back pain.   Last weekend had some diarrhea, abdominal pain.. has been eating a lot of  raw greens  Now has resolved.  BMs are now normal as of this AM.  No blood in BM.  After discussion he realized he has had some intermittent pain with ejaculation off and on for some time, also flow issues developing for some time.   Reviewed Abd pelvic CT from 03/1022  03/2021.. treated with antibiotics for diverticulitis    Hx of UTI, BPH and ? chronic prostatitis.    At home BP has been 14-150/80.Marland Kitchen on no medication to treat. BP Readings from Last 3 Encounters:  06/28/22 (!) 158/102  04/02/22 (!) 139/102  03/29/22 (!) 146/93   He is unhappy with the  urologist he is seen in [facility], he would like a referral to [facility] for further evaluation of this issue if needed.  Relevant past medical, surgical, family and social history reviewed and updated as indicated. Interim medical history since our last visit reviewed. Allergies and medications reviewed and updated. Outpatient Medications Prior to Visit  Medication Sig Dispense Refill   apixaban (ELIQUIS) 5 MG TABS tablet Take 1 tablet (5 mg total) by mouth 2 (two) times daily. 60 tablet 5   levothyroxine (SYNTHROID) 25 MCG tablet Take 1 tablet (25 mcg total) by mouth daily. 90 tablet 1   amoxicillin-clavulanate (AUGMENTIN) 875-125 MG tablet Take 1 tablet by mouth 2 (two) times daily. 20 tablet 0   atorvastatin (LIPITOR) 40 MG tablet Take 1 tablet (40 mg total) by mouth daily. 30 tablet 0   No facility-administered medications prior to visit.     Per HPI unless specifically indicated in ROS section below Review of Systems  Constitutional:  Negative for fatigue and fever.  HENT:  Negative for ear pain.   Eyes:  Negative for pain.  Respiratory:  Negative for cough and shortness of breath.   Cardiovascular:  Negative for chest pain, palpitations and leg swelling.  Gastrointestinal:  Negative for  abdominal pain.  Genitourinary:  Positive for dysuria, frequency, penile pain and urgency. Negative for decreased urine volume, flank pain, genital sores, hematuria, penile discharge, penile swelling, scrotal swelling and testicular pain.  Musculoskeletal:  Positive for back pain. Negative for arthralgias.  Neurological:  Negative for syncope, light-headedness and headaches.  Psychiatric/Behavioral:  Negative for dysphoric mood.    Objective:  BP (!) 158/102   Pulse (!) 58   Temp (!) 97.2 F (36.2 C) (Temporal)   Resp 12   Ht 5' 6.5" (1.689 m)   Wt 182 lb (82.6 kg)   SpO2 99%   BMI 28.94 kg/m   Wt Readings from Last 3 Encounters:  06/28/22 182 lb (82.6 kg)  04/02/22 180 lb 12.4 oz  (82 kg)  03/29/22 182 lb (82.6 kg)      Physical Exam Constitutional:      Appearance: He is well-developed.  HENT:     Head: Normocephalic.     Right Ear: Hearing normal.     Left Ear: Hearing normal.     Nose: Nose normal.  Neck:     Thyroid: No thyroid mass or thyromegaly.     Vascular: No carotid bruit.     Trachea: Trachea normal.  Cardiovascular:     Rate and Rhythm: Normal rate and regular rhythm.     Pulses: Normal pulses.     Heart sounds: Heart sounds not distant. No murmur heard.    No friction rub. No gallop.     Comments: No peripheral edema Pulmonary:     Effort: Pulmonary effort is normal. No respiratory distress.     Breath sounds: Normal breath sounds.  Abdominal:     General: Abdomen is flat. Bowel sounds are normal.     Palpations: Abdomen is soft. There is no shifting dullness, fluid wave or mass.     Tenderness: There is generalized abdominal tenderness. There is no right CVA tenderness, left CVA tenderness, guarding or rebound. Negative signs include Murphy's sign.     Hernia: No hernia is present.  Skin:    General: Skin is warm and dry.     Findings: No rash.  Psychiatric:        Speech: Speech normal.        Behavior: Behavior normal.        Thought Content: Thought content normal.       Results for orders placed or performed during the hospital encounter of 03/29/22  Lipase, blood  Result Value Ref Range   Lipase 24 11 - 51 U/L  Comprehensive metabolic panel  Result Value Ref Range   Sodium 138 135 - 145 mmol/L   Potassium 4.3 3.5 - 5.1 mmol/L   Chloride 106 98 - 111 mmol/L   CO2 24 22 - 32 mmol/L   Glucose, Bld 114 (H) 70 - 99 mg/dL   BUN 18 8 - 23 mg/dL   Creatinine, Ser 1.15 0.61 - 1.24 mg/dL   Calcium 9.2 8.9 - 10.3 mg/dL   Total Protein 7.4 6.5 - 8.1 g/dL   Albumin 4.3 3.5 - 5.0 g/dL   AST 29 15 - 41 U/L   ALT 32 0 - 44 U/L   Alkaline Phosphatase 55 38 - 126 U/L   Total Bilirubin 1.7 (H) 0.3 - 1.2 mg/dL   GFR, Estimated >60  >60 mL/min   Anion gap 8 5 - 15  CBC  Result Value Ref Range   WBC 11.5 (H) 4.0 - 10.5 K/uL   RBC 4.86  4.22 - 5.81 MIL/uL   Hemoglobin 13.9 13.0 - 17.0 g/dL   HCT 43.3 39.0 - 52.0 %   MCV 89.1 80.0 - 100.0 fL   MCH 28.6 26.0 - 34.0 pg   MCHC 32.1 30.0 - 36.0 g/dL   RDW 12.6 11.5 - 15.5 %   Platelets 231 150 - 400 K/uL   nRBC 0.0 0.0 - 0.2 %  Urinalysis, Routine w reflex microscopic  Result Value Ref Range   Color, Urine YELLOW (A) YELLOW   APPearance CLEAR (A) CLEAR   Specific Gravity, Urine 1.023 1.005 - 1.030   pH 5.0 5.0 - 8.0   Glucose, UA NEGATIVE NEGATIVE mg/dL   Hgb urine dipstick SMALL (A) NEGATIVE   Bilirubin Urine NEGATIVE NEGATIVE   Ketones, ur 5 (A) NEGATIVE mg/dL   Protein, ur NEGATIVE NEGATIVE mg/dL   Nitrite NEGATIVE NEGATIVE   Leukocytes,Ua NEGATIVE NEGATIVE   RBC / HPF 0-5 0 - 5 RBC/hpf   WBC, UA 0-5 0 - 5 WBC/hpf   Bacteria, UA RARE (A) NONE SEEN   Squamous Epithelial / LPF NONE SEEN 0 - 5   Mucus PRESENT      COVID 19 screen:  No recent travel or known exposure to COVID19 The patient denies respiratory symptoms of COVID 19 at this time. The importance of social distancing was discussed today.   Assessment and Plan    Problem List Items Addressed This Visit     Acute prostatitis    Acute on possibly subacute/chronic symptoms Most consistent with current bacterial prostatitis.  No sign of urinary tract infection from bladder to kidney.        Benign localized prostatic hyperplasia with lower urinary tract symptoms (LUTS)    Some of his chronic flow issues may be secondary to his history of BPH.  More acute symptoms may be related to new infection.      Elevated blood pressure reading    Last few blood pressure measurements have been elevated but have all been in setting of pain, laceration or new issue.  He states he may have some mild elevation at home.  He will follow his blood pressure and update me in 1 week.  He may need a medication to  lower blood pressure.      Generalized abdominal pain    Symptoms today do not correspond directly with diverticulitis, kidney stone or gallbladder stone.  Most likely symptoms are referred from the prostate.      Other Visit Diagnoses     Dysuria    -  Primary   Relevant Orders   POCT Urinalysis Dipstick (Automated) (Completed)   Urine Culture        Eliezer Lofts, MD

## 2022-06-28 NOTE — Assessment & Plan Note (Signed)
Symptoms today do not correspond directly with diverticulitis, kidney stone or gallbladder stone.  Most likely symptoms are referred from the prostate.

## 2022-06-28 NOTE — Patient Instructions (Addendum)
Follow BP at home. Call if not  at goal < 140/90.   Push fluids.  Start probiotic while on antibiotics.  Complete antibiotics for  likely prostate infection.  We will call with urine culture results.  Call with BP update and overall update in 1 week.  Call sooner if not tolerating antibiotics or fever, worsening abdominal pain.

## 2022-06-28 NOTE — Assessment & Plan Note (Signed)
Some of his chronic flow issues may be secondary to his history of BPH.  More acute symptoms may be related to new infection.

## 2022-06-29 LAB — URINE CULTURE
MICRO NUMBER:: 13950103
Result:: NO GROWTH
SPECIMEN QUALITY:: ADEQUATE

## 2022-06-30 ENCOUNTER — Encounter: Payer: Self-pay | Admitting: Family Medicine

## 2022-07-19 ENCOUNTER — Encounter: Payer: Self-pay | Admitting: Family Medicine

## 2022-07-20 MED ORDER — CIPROFLOXACIN HCL 500 MG PO TABS
500.0000 mg | ORAL_TABLET | Freq: Two times a day (BID) | ORAL | 0 refills | Status: AC
Start: 2022-07-20 — End: 2022-08-03

## 2022-08-21 ENCOUNTER — Other Ambulatory Visit: Payer: Self-pay | Admitting: Family Medicine

## 2022-08-21 ENCOUNTER — Encounter: Payer: Self-pay | Admitting: Family Medicine

## 2022-08-21 DIAGNOSIS — E039 Hypothyroidism, unspecified: Secondary | ICD-10-CM

## 2022-09-25 ENCOUNTER — Ambulatory Visit (INDEPENDENT_AMBULATORY_CARE_PROVIDER_SITE_OTHER): Payer: Medicare Other | Admitting: Family Medicine

## 2022-09-25 ENCOUNTER — Encounter: Payer: Self-pay | Admitting: Family Medicine

## 2022-09-25 ENCOUNTER — Other Ambulatory Visit: Payer: Self-pay | Admitting: Family Medicine

## 2022-09-25 VITALS — BP 140/86 | HR 57 | Temp 97.9°F | Ht 66.5 in | Wt 185.1 lb

## 2022-09-25 DIAGNOSIS — L299 Pruritus, unspecified: Secondary | ICD-10-CM | POA: Diagnosis not present

## 2022-09-25 DIAGNOSIS — R7989 Other specified abnormal findings of blood chemistry: Secondary | ICD-10-CM

## 2022-09-25 DIAGNOSIS — R1013 Epigastric pain: Secondary | ICD-10-CM

## 2022-09-25 DIAGNOSIS — R1084 Generalized abdominal pain: Secondary | ICD-10-CM

## 2022-09-25 LAB — CBC WITH DIFFERENTIAL/PLATELET
Basophils Absolute: 0 10*3/uL (ref 0.0–0.1)
Basophils Relative: 0.5 % (ref 0.0–3.0)
Eosinophils Absolute: 0.6 10*3/uL (ref 0.0–0.7)
Eosinophils Relative: 8.5 % — ABNORMAL HIGH (ref 0.0–5.0)
HCT: 42.9 % (ref 39.0–52.0)
Hemoglobin: 14.2 g/dL (ref 13.0–17.0)
Lymphocytes Relative: 25.9 % (ref 12.0–46.0)
Lymphs Abs: 1.9 10*3/uL (ref 0.7–4.0)
MCHC: 33.2 g/dL (ref 30.0–36.0)
MCV: 88.1 fl (ref 78.0–100.0)
Monocytes Absolute: 0.4 10*3/uL (ref 0.1–1.0)
Monocytes Relative: 6.3 % (ref 3.0–12.0)
Neutro Abs: 4.2 10*3/uL (ref 1.4–7.7)
Neutrophils Relative %: 58.8 % (ref 43.0–77.0)
Platelets: 281 10*3/uL (ref 150.0–400.0)
RBC: 4.87 Mil/uL (ref 4.22–5.81)
RDW: 13.5 % (ref 11.5–15.5)
WBC: 7.2 10*3/uL (ref 4.0–10.5)

## 2022-09-25 LAB — POC URINALSYSI DIPSTICK (AUTOMATED)
Bilirubin, UA: NEGATIVE
Blood, UA: NEGATIVE
Glucose, UA: NEGATIVE
Ketones, UA: NEGATIVE
Leukocytes, UA: NEGATIVE
Nitrite, UA: NEGATIVE
Protein, UA: NEGATIVE
Spec Grav, UA: 1.025 (ref 1.010–1.025)
Urobilinogen, UA: 0.2 E.U./dL
pH, UA: 5 (ref 5.0–8.0)

## 2022-09-25 LAB — COMPREHENSIVE METABOLIC PANEL
ALT: 301 U/L — ABNORMAL HIGH (ref 0–53)
AST: 128 U/L — ABNORMAL HIGH (ref 0–37)
Albumin: 4.3 g/dL (ref 3.5–5.2)
Alkaline Phosphatase: 111 U/L (ref 39–117)
BUN: 26 mg/dL — ABNORMAL HIGH (ref 6–23)
CO2: 27 mEq/L (ref 19–32)
Calcium: 9.4 mg/dL (ref 8.4–10.5)
Chloride: 104 mEq/L (ref 96–112)
Creatinine, Ser: 1.11 mg/dL (ref 0.40–1.50)
GFR: 68.17 mL/min (ref >60.00)
Glucose, Bld: 102 mg/dL — ABNORMAL HIGH (ref 70–99)
Potassium: 4.4 mEq/L (ref 3.5–5.1)
Sodium: 138 mEq/L (ref 135–145)
Total Bilirubin: 0.8 mg/dL (ref 0.2–1.2)
Total Protein: 6.9 g/dL (ref 6.0–8.3)

## 2022-09-25 LAB — TSH: TSH: 2.65 u[IU]/mL (ref 0.35–5.50)

## 2022-09-25 LAB — LIPASE: Lipase: 9 U/L — ABNORMAL LOW (ref 11.0–59.0)

## 2022-09-25 NOTE — Patient Instructions (Addendum)
Contact cardiologist to  discuss itching  as possible side effect of Eliquis.  Please stop at the lab to have labs drawn.   We will consider moving forward with [place] to check on gallbladder.

## 2022-09-25 NOTE — Addendum Note (Signed)
Addended by: Eliezer Lofts E on: 09/25/2022 10:08 AM   Modules accepted: Level of Service

## 2022-09-25 NOTE — Assessment & Plan Note (Signed)
Chronic, patient feels that this is most likely secondary to Eliquis as it started after he initiated this for atrial fibrillation.  I encouraged him to discuss this with his cardiologist.  I will evaluate with lab work for secondary causes of itching.  Encouraged him to keep up with water and moisturize skin. No rash seen.  He can use antihistamine such as Claritin to help with possible mild allergic reaction.

## 2022-09-25 NOTE — Progress Notes (Signed)
Patient ID: Alex Huffman, male    DOB: 1956/09/02, 66 y.o.   MRN: [id number]  This visit was conducted in person.  BP (!) 140/86   Pulse (!) 57   Temp 97.9 F (36.6 C) (Oral)   Ht 5' 6.5" (1.689 m)   Wt 185 lb 2 oz (84 kg)   SpO2 97%   BMI 29.43 kg/m    CC:  Chief Complaint  Patient presents with   Abdominal Pain    Lower   Nausea   Bloated   Excessive Itching    Subjective:   HPI: Alex Huffman is a 66 y.o. male presenting on 09/25/2022 for Abdominal Pain (Lower), Nausea, Bloated, and Excessive Itching  See in 06/2022 for dysuria.. I dx acute prostatitis.Marland Kitchen treated with Cipro x 4-6 weeks. Recommended he return to Urology 07/2022 Dr. Bernardo Heater, but pt did not. Felt generalized abdominal pain was likely due to prostate     1.5 week ago He has started with fever 99.5-100 F. Lower abdominal pain and groin pain .  Symptoms resolved/improved.. he did not go to  urgent care as recommended.  Started probiotic daily x 3 weeks.. has helped with BMs and helped with digestion.   Few days after fever.. he has  had nausea when eating, bloating, no BM in last few days ( he did stop the probiotic as he thought it could be causing issues).   Pain in upper abdomen. Radiates down to umbilicus.  No dysuria.   Pain 7-8/10 pain scale.. constant.  Hx of diverticulitis  He has been having itching since being on Eliqus.   Relevant past medical, surgical, family and social history reviewed and updated as indicated. Interim medical history since our last visit reviewed. Allergies and medications reviewed and updated. Outpatient Medications Prior to Visit  Medication Sig Dispense Refill   apixaban (ELIQUIS) 5 MG TABS tablet Take 1 tablet (5 mg total) by mouth 2 (two) times daily. 60 tablet 5   levothyroxine (SYNTHROID) 25 MCG tablet Take 1 tablet by mouth once daily 90 tablet 0   No facility-administered medications prior to visit.     Per HPI unless specifically indicated in ROS  section below Review of Systems  Constitutional:  Negative for fatigue and fever.  HENT:  Negative for ear pain.   Eyes:  Negative for pain.  Respiratory:  Negative for cough and shortness of breath.   Cardiovascular:  Negative for chest pain, palpitations and leg swelling.  Gastrointestinal:  Positive for abdominal distention, abdominal pain, constipation and nausea. Negative for blood in stool.  Genitourinary:  Negative for dysuria.  Musculoskeletal:  Negative for arthralgias.  Neurological:  Negative for syncope, light-headedness and headaches.  Psychiatric/Behavioral:  Negative for dysphoric mood.    Objective:  BP (!) 140/86   Pulse (!) 57   Temp 97.9 F (36.6 C) (Oral)   Ht 5' 6.5" (1.689 m)   Wt 185 lb 2 oz (84 kg)   SpO2 97%   BMI 29.43 kg/m   Wt Readings from Last 3 Encounters:  09/25/22 185 lb 2 oz (84 kg)  06/28/22 182 lb (82.6 kg)  04/02/22 180 lb 12.4 oz (82 kg)      Physical Exam Constitutional:      Appearance: He is well-developed.  HENT:     Head: Normocephalic.     Right Ear: Hearing normal.     Left Ear: Hearing normal.     Nose: Nose normal.  Neck:  Thyroid: No thyroid mass or thyromegaly.     Vascular: No carotid bruit.     Trachea: Trachea normal.  Cardiovascular:     Rate and Rhythm: Normal rate and regular rhythm.     Pulses: Normal pulses.     Heart sounds: Heart sounds not distant. No murmur heard.    No friction rub. No gallop.     Comments: No peripheral edema Pulmonary:     Effort: Pulmonary effort is normal. No respiratory distress.     Breath sounds: Normal breath sounds.  Abdominal:     Tenderness: There is abdominal tenderness in the epigastric area, suprapubic area and left lower quadrant. There is guarding. There is no right CVA tenderness, left CVA tenderness or rebound.  Skin:    General: Skin is warm and dry.     Findings: No rash.  Psychiatric:        Speech: Speech normal.        Behavior: Behavior normal.         Thought Content: Thought content normal.       Results for orders placed or performed in visit on 06/28/22  Urine Culture   Specimen: Urine  Result Value Ref Range   MICRO NUMBER: [id number]    SPECIMEN QUALITY: Adequate    Sample Source URINE    STATUS: FINAL    Result: No Growth   POCT Urinalysis Dipstick (Automated)  Result Value Ref Range   Color, UA yello    Clarity, UA clear    Glucose, UA Negative Negative   Bilirubin, UA negative    Ketones, UA negative    Spec Grav, UA 1.010 1.010 - 1.025   Blood, UA negative    pH, UA 5.5 5.0 - 8.0   Protein, UA Negative Negative   Urobilinogen, UA 0.2 0.2 or 1.0 E.U./dL   Nitrite, UA negative    Leukocytes, UA Small (1+) (A) Negative     COVID 19 screen:  No recent travel or known exposure to COVID19 The patient denies respiratory symptoms of COVID 19 at this time. The importance of social distancing was discussed today.   Assessment and Plan    Problem List Items Addressed This Visit     Generalized abdominal pain - Primary     Acute. Pain is mainly epigastric but radiates down to central and left lower quadrant. No clear suggestion of gastritis or heartburn. Will evaluate with labs including c-Met CBC and lipase. Given history of cholelithiasis consider ultrasound to evaluate gallbladder if labs suggestive of that were unremarkable. Not clear presentation of diverticulitis, but keep this in mind given he has a history of this.  Will check UA today given history of prostatitis but currently no groin pain      Relevant Orders   Comprehensive metabolic panel   CBC with Differential/Platelet   Lipase   Itching    Chronic, patient feels that this is most likely secondary to Eliquis as it started after he initiated this for atrial fibrillation.  I encouraged him to discuss this with his cardiologist.  I will evaluate with lab work for secondary causes of itching.  Encouraged him to keep up with water and moisturize  skin. No rash seen.  He can use antihistamine such as Claritin to help with possible mild allergic reaction.      Relevant Orders   TSH     Eliezer Lofts, MD

## 2022-09-25 NOTE — Assessment & Plan Note (Signed)
Acute. Pain is mainly epigastric but radiates down to central and left lower quadrant. No clear suggestion of gastritis or heartburn. Will evaluate with labs including c-Met CBC and lipase. Given history of cholelithiasis consider ultrasound to evaluate gallbladder if labs suggestive of that were unremarkable. Not clear presentation of diverticulitis, but keep this in mind given he has a history of this.  Will check UA today given history of prostatitis but currently no groin pain

## 2022-09-25 NOTE — Addendum Note (Signed)
Addended by: Carter Kitten on: 09/25/2022 10:20 AM   Modules accepted: Orders

## 2022-09-26 ENCOUNTER — Ambulatory Visit
Admission: RE | Admit: 2022-09-26 | Discharge: 2022-09-26 | Disposition: A | Discharge status: Home / Self Care | Payer: Medicare Other | Source: Ambulatory Visit | Attending: Family Medicine | Admitting: Family Medicine

## 2022-09-26 ENCOUNTER — Encounter: Payer: Self-pay | Admitting: Family Medicine

## 2022-09-26 DIAGNOSIS — R1013 Epigastric pain: Secondary | ICD-10-CM | POA: Diagnosis present

## 2022-09-26 DIAGNOSIS — R945 Abnormal results of liver function studies: Secondary | ICD-10-CM

## 2022-09-26 DIAGNOSIS — R7989 Other specified abnormal findings of blood chemistry: Secondary | ICD-10-CM | POA: Insufficient documentation

## 2022-09-26 DIAGNOSIS — R1084 Generalized abdominal pain: Secondary | ICD-10-CM

## 2022-09-26 DIAGNOSIS — R7401 Elevation of levels of liver transaminase levels: Secondary | ICD-10-CM

## 2022-09-27 ENCOUNTER — Other Ambulatory Visit: Payer: Self-pay | Admitting: Family Medicine

## 2022-09-27 ENCOUNTER — Encounter: Payer: Self-pay | Admitting: *Deleted

## 2022-09-27 DIAGNOSIS — K8040 Calculus of bile duct with cholecystitis, unspecified, without obstruction: Secondary | ICD-10-CM

## 2022-09-28 ENCOUNTER — Ambulatory Visit
Admission: RE | Admit: 2022-09-28 | Discharge: 2022-09-28 | Disposition: A | Discharge status: Home / Self Care | Payer: Medicare Other | Source: Ambulatory Visit | Attending: Family Medicine | Admitting: Family Medicine

## 2022-09-28 ENCOUNTER — Other Ambulatory Visit: Payer: Self-pay | Admitting: Family Medicine

## 2022-09-28 ENCOUNTER — Other Ambulatory Visit (INDEPENDENT_AMBULATORY_CARE_PROVIDER_SITE_OTHER): Payer: Medicare Other

## 2022-09-28 DIAGNOSIS — R7989 Other specified abnormal findings of blood chemistry: Secondary | ICD-10-CM

## 2022-09-28 DIAGNOSIS — R935 Abnormal findings on diagnostic imaging of other abdominal regions, including retroperitoneum: Secondary | ICD-10-CM | POA: Insufficient documentation

## 2022-09-28 DIAGNOSIS — M316 Other giant cell arteritis: Secondary | ICD-10-CM

## 2022-09-28 DIAGNOSIS — R1084 Generalized abdominal pain: Secondary | ICD-10-CM | POA: Insufficient documentation

## 2022-09-28 DIAGNOSIS — R7401 Elevation of levels of liver transaminase levels: Secondary | ICD-10-CM | POA: Diagnosis not present

## 2022-09-28 DIAGNOSIS — R945 Abnormal results of liver function studies: Secondary | ICD-10-CM

## 2022-09-28 LAB — GAMMA GT: GGT: 173 U/L — ABNORMAL HIGH (ref 7–51)

## 2022-09-28 LAB — COMPREHENSIVE METABOLIC PANEL
ALT: 206 U/L — ABNORMAL HIGH (ref 0–53)
AST: 65 U/L — ABNORMAL HIGH (ref 0–37)
Albumin: 4.2 g/dL (ref 3.5–5.2)
Alkaline Phosphatase: 96 U/L (ref 39–117)
BUN: 22 mg/dL (ref 6–23)
CO2: 29 mEq/L (ref 19–32)
Calcium: 9.1 mg/dL (ref 8.4–10.5)
Chloride: 104 mEq/L (ref 96–112)
Creatinine, Ser: 1.13 mg/dL (ref 0.40–1.50)
GFR: 66.72 mL/min (ref >60.00)
Glucose, Bld: 101 mg/dL — ABNORMAL HIGH (ref 70–99)
Potassium: 4.4 mEq/L (ref 3.5–5.1)
Sodium: 140 mEq/L (ref 135–145)
Total Bilirubin: 0.5 mg/dL (ref 0.2–1.2)
Total Protein: 6.5 g/dL (ref 6.0–8.3)

## 2022-09-28 MED ORDER — IOHEXOL 300 MG/ML  SOLN
100.0000 mL | Freq: Once | INTRAMUSCULAR | Status: AC | PRN
Start: 2022-09-28 — End: 2022-09-28
  Administered 2022-09-28: 100 mL via INTRAVENOUS

## 2022-10-02 ENCOUNTER — Encounter: Payer: Self-pay | Admitting: Family Medicine

## 2022-10-02 LAB — HEPATITIS PANEL, ACUTE
Hep A IgM: NONREACTIVE
Hep B C IgM: NONREACTIVE
Hepatitis B Surface Ag: NONREACTIVE
Hepatitis C Ab: NONREACTIVE

## 2022-10-03 ENCOUNTER — Other Ambulatory Visit: Payer: Self-pay | Admitting: Family Medicine

## 2022-10-03 DIAGNOSIS — R935 Abnormal findings on diagnostic imaging of other abdominal regions, including retroperitoneum: Secondary | ICD-10-CM

## 2022-10-04 ENCOUNTER — Encounter: Payer: Self-pay | Admitting: *Deleted

## 2022-10-12 ENCOUNTER — Other Ambulatory Visit (INDEPENDENT_AMBULATORY_CARE_PROVIDER_SITE_OTHER): Payer: Medicare Other

## 2022-10-12 DIAGNOSIS — R935 Abnormal findings on diagnostic imaging of other abdominal regions, including retroperitoneum: Secondary | ICD-10-CM | POA: Diagnosis not present

## 2022-10-12 LAB — SEDIMENTATION RATE: Sed Rate: 15 mm/hr (ref 0–20)

## 2022-10-30 ENCOUNTER — Other Ambulatory Visit: Payer: Medicare Other

## 2022-11-01 ENCOUNTER — Ambulatory Visit (INDEPENDENT_AMBULATORY_CARE_PROVIDER_SITE_OTHER): Payer: Medicare Other

## 2022-11-01 VITALS — Ht 66.5 in | Wt 185.0 lb

## 2022-11-01 DIAGNOSIS — Z0000 Encounter for general adult medical examination without abnormal findings: Secondary | ICD-10-CM

## 2022-11-01 NOTE — Patient Instructions (Signed)
Alex Huffman , Thank you for taking time to come for your Medicare Wellness Visit. I appreciate your ongoing commitment to your health goals. Please review the following plan we discussed and let me know if I can assist you in the future.   These are the goals we discussed:  Goals      DIET - EAT MORE FRUITS AND VEGETABLES     Patient Stated     10/11/2020, I will maintain and continue medications as prescribed.         This is a list of the screening recommended for you and due dates:  Health Maintenance  Topic Date Due   COVID-19 Vaccine (2 - 2023-24 season) 06/08/2022   Flu Shot  01/06/2023*   Medicare Annual Wellness Visit  11/02/2023   Colon Cancer Screening  01/18/2028   DTaP/Tdap/Td vaccine (2 - Td or Tdap) 04/02/2032   Pneumonia Vaccine  Completed   Hepatitis C Screening: USPSTF Recommendation to screen - Ages 28-79 yo.  Completed   Zoster (Shingles) Vaccine  Completed   HPV Vaccine  Aged Out  *Topic was postponed. The date shown is not the original due date.    Advanced directives: no  Conditions/risks identified: none  Next appointment: Follow up in one year for your annual wellness visit. 11/04/23 @ 3:15 pm by phone  Preventive Care 65 Years and Older, Male  Preventive care refers to lifestyle choices and visits with your health care provider that can promote health and wellness. What does preventive care include? A yearly physical exam. This is also called an annual well check. Dental exams once or twice a year. Routine eye exams. Ask your health care provider how often you should have your eyes checked. Personal lifestyle choices, including: Daily care of your teeth and gums. Regular physical activity. Eating a healthy diet. Avoiding tobacco and drug use. Limiting alcohol use. Practicing safe sex. Taking low doses of aspirin every day. Taking vitamin and mineral supplements as recommended by your health care provider. What happens during an annual well  check? The services and screenings done by your health care provider during your annual well check will depend on your age, overall health, lifestyle risk factors, and family history of disease. Counseling  Your health care provider may ask you questions about your: Alcohol use. Tobacco use. Drug use. Emotional well-being. Home and relationship well-being. Sexual activity. Eating habits. History of falls. Memory and ability to understand (cognition). Work and work Statistician. Screening  You may have the following tests or measurements: Height, weight, and BMI. Blood pressure. Lipid and cholesterol levels. These may be checked every 5 years, or more frequently if you are over 12 years old. Skin check. Lung cancer screening. You may have this screening every year starting at age 54 if you have a 30-pack-year history of smoking and currently smoke or have quit within the past 15 years. Fecal occult blood test (FOBT) of the stool. You may have this test every year starting at age 16. Flexible sigmoidoscopy or colonoscopy. You may have a sigmoidoscopy every 5 years or a colonoscopy every 10 years starting at age 45. Prostate cancer screening. Recommendations will vary depending on your family history and other risks. Hepatitis C blood test. Hepatitis B blood test. Sexually transmitted disease (STD) testing. Diabetes screening. This is done by checking your blood sugar (glucose) after you have not eaten for a while (fasting). You may have this done every 1-3 years. Abdominal aortic aneurysm (AAA) screening. You may need  this if you are a current or former smoker. Osteoporosis. You may be screened starting at age 80 if you are at high risk. Talk with your health care provider about your test results, treatment options, and if necessary, the need for more tests. Vaccines  Your health care provider may recommend certain vaccines, such as: Influenza vaccine. This is recommended every  year. Tetanus, diphtheria, and acellular pertussis (Tdap, Td) vaccine. You may need a Td booster every 10 years. Zoster vaccine. You may need this after age 63. Pneumococcal 13-valent conjugate (PCV13) vaccine. One dose is recommended after age 52. Pneumococcal polysaccharide (PPSV23) vaccine. One dose is recommended after age 46. Talk to your health care provider about which screenings and vaccines you need and how often you need them. This information is not intended to replace advice given to you by your health care provider. Make sure you discuss any questions you have with your health care provider. Document Released: 10/21/2015 Document Revised: 06/13/2016 Document Reviewed: 07/26/2015 Elsevier Interactive Patient Education  2017 [place] Prevention in the Home Falls can cause injuries. They can happen to people of all ages. There are many things you can do to make your home safe and to help prevent falls. What can I do on the outside of my home? Regularly fix the edges of walkways and driveways and fix any cracks. Remove anything that might make you trip as you walk through a door, such as a raised step or threshold. Trim any bushes or trees on the path to your home. Use bright outdoor lighting. Clear any walking paths of anything that might make someone trip, such as rocks or tools. Regularly check to see if handrails are loose or broken. Make sure that both sides of any steps have handrails. Any raised decks and porches should have guardrails on the edges. Have any leaves, snow, or ice cleared regularly. Use sand or salt on walking paths during winter. Clean up any spills in your garage right away. This includes oil or grease spills. What can I do in the bathroom? Use night lights. Install grab bars by the toilet and in the tub and shower. Do not use towel bars as grab bars. Use non-skid mats or decals in the tub or shower. If you need to sit down in the shower, use a  plastic, non-slip stool. Keep the floor dry. Clean up any water that spills on the floor as soon as it happens. Remove soap buildup in the tub or shower regularly. Attach bath mats securely with double-sided non-slip rug tape. Do not have throw rugs and other things on the floor that can make you trip. What can I do in the bedroom? Use night lights. Make sure that you have a light by your bed that is easy to reach. Do not use any sheets or blankets that are too big for your bed. They should not hang down onto the floor. Have a firm chair that has side arms. You can use this for support while you get dressed. Do not have throw rugs and other things on the floor that can make you trip. What can I do in the kitchen? Clean up any spills right away. Avoid walking on wet floors. Keep items that you use a lot in easy-to-reach places. If you need to reach something above you, use a strong step stool that has a grab bar. Keep electrical cords out of the way. Do not use floor polish or wax that makes floors  slippery. If you must use wax, use non-skid floor wax. Do not have throw rugs and other things on the floor that can make you trip. What can I do with my stairs? Do not leave any items on the stairs. Make sure that there are handrails on both sides of the stairs and use them. Fix handrails that are broken or loose. Make sure that handrails are as long as the stairways. Check any carpeting to make sure that it is firmly attached to the stairs. Fix any carpet that is loose or worn. Avoid having throw rugs at the top or bottom of the stairs. If you do have throw rugs, attach them to the floor with carpet tape. Make sure that you have a light switch at the top of the stairs and the bottom of the stairs. If you do not have them, ask someone to add them for you. What else can I do to help prevent falls? Wear shoes that: Do not have high heels. Have rubber bottoms. Are comfortable and fit you  well. Are closed at the toe. Do not wear sandals. If you use a stepladder: Make sure that it is fully opened. Do not climb a closed stepladder. Make sure that both sides of the stepladder are locked into place. Ask someone to hold it for you, if possible. Clearly mark and make sure that you can see: Any grab bars or handrails. First and last steps. Where the edge of each step is. Use tools that help you move around (mobility aids) if they are needed. These include: Canes. Walkers. Scooters. Crutches. Turn on the lights when you go into a dark area. Replace any light bulbs as soon as they burn out. Set up your furniture so you have a clear path. Avoid moving your furniture around. If any of your floors are uneven, fix them. If there are any pets around you, be aware of where they are. Review your medicines with your doctor. Some medicines can make you feel dizzy. This can increase your chance of falling. Ask your doctor what other things that you can do to help prevent falls. This information is not intended to replace advice given to you by your health care provider. Make sure you discuss any questions you have with your health care provider. Document Released: 07/21/2009 Document Revised: 03/01/2016 Document Reviewed: 10/29/2014 Elsevier Interactive Patient Education  2017 [employer].

## 2022-11-01 NOTE — Progress Notes (Signed)
Virtual Visit via Telephone Note  I connected with  Alex Huffman on 11/01/22 at  3:00 PM EST by telephone and verified that I am speaking with the correct person using two identifiers.  Location: Patient: home Provider: [place] Persons participating in the virtual visit: [place]   I discussed the limitations, risks, security and privacy concerns of performing an evaluation and management service by telephone and the availability of in person appointments. The patient expressed understanding and agreed to proceed.  Interactive audio and video telecommunications were attempted between this nurse and patient, however failed, due to patient having technical difficulties OR patient did not have access to video capability.  We continued and completed visit with audio only.  Some vital signs may be absent or patient reported.   Dionisio David, LPN  Subjective:   Alex Huffman is a 67 y.o. male who presents for Medicare Annual/Subsequent preventive examination.  Review of Systems     Cardiac Risk Factors include: advanced age (>10mn, >>20women);male gender     Objective:    There were no vitals filed for this visit. There is no height or weight on file to calculate BMI.     11/01/2022    3:17 PM 04/02/2022   12:22 PM 03/29/2022   12:43 PM 11/09/2020    9:15 AM 10/19/2020    9:46 PM 10/19/2020    9:37 PM 10/11/2020    3:52 PM  Advanced Directives  Does Patient Have a Medical Advance Directive? No No No Yes  Yes Yes  Type of [place]  Copy of [place] Chart?     No - copy requested  No - copy requested  Would patient like information on creating a medical advance directive? No - Patient declined  No - Patient declined        Current Medications (verified) Outpatient Encounter Medications as of 11/01/2022  Medication  Sig   apixaban (ELIQUIS) 5 MG TABS tablet Take 1 tablet (5 mg total) by mouth 2 (two) times daily.   levothyroxine (SYNTHROID) 25 MCG tablet Take 1 tablet by mouth once daily   No facility-administered encounter medications on file as of 11/01/2022.    Allergies (verified) Typhoid vaccine, live; Typhoid vi polysaccharide vaccine; Amlodipine; Atorvastatin; Doxycycline; Nitrofurantoin; and Sulfa antibiotics   History: Past Medical History:  Diagnosis Date   Anxiety    Atrial fibrillation (HCC)    Cataract    Chronic headaches    Helicobacter pylori gastritis 2009   EGD   Hyperlipemia    Personal history of colonic polyps    Past Surgical History:  Procedure Laterality Date   CARDIOVERSION N/A 11/09/2020   Procedure: CARDIOVERSION;  Surgeon: OGeralynn Rile MD;  [place]  Service: Cardiovascular;  Laterality: N/A;   COLONOSCOPY  , 06/08/2011   2009: two 3 mm hyperplastic polyps, lipoma, internal hemorrjhoids 2012: 466mcecal serrated adenoma, lipoma, internal hemorrhoids   HERNIA REPAIR  05/04   ROTATOR CUFF REPAIR  05/06   UPPER GASTROINTESTINAL ENDOSCOPY  02/02/2008   erosive gastropathy   Family History  Problem Relation Age of Onset   Heart disease Father    Celiac disease Mother    Colon cancer Neg Hx    Esophageal cancer Neg Hx    Liver cancer Neg Hx    Pancreatic cancer Neg Hx  Rectal cancer Neg Hx    Stomach cancer Neg Hx    Allergic rhinitis Neg Hx    Social History   Socioeconomic History   Marital status: Married    Spouse name: Not on file   Number of children: 3   Years of education: Not on file   Highest education level: Not on file  Occupational History   Occupation: Retired  Tobacco Use   Smoking status: Never   Smokeless tobacco: Never  Vaping Use   Vaping Use: Never used  Substance and Sexual Activity   Alcohol use: Not Currently    Comment: Occasional beer   Drug use: No   Sexual activity: Yes    Partners: Female     Birth control/protection: None  Other Topics Concern   Not on file  Social History Narrative   Not on file   Social Determinants of Health   Financial Resource Strain: Low Risk  (11/01/2022)   Overall Financial Resource Strain (CARDIA)    Difficulty of Paying Living Expenses: Not hard at all  Food Insecurity: No Food Insecurity (11/01/2022)   Hunger Vital Sign    Worried About Running Out of Food in the Last Year: Never true    [place] in the Last Year: Never true  Transportation Needs: No Transportation Needs (11/01/2022)   PRAPARE - Hydrologist (Medical): No    Lack of Transportation (Non-Medical): No  Physical Activity: Insufficiently Active (11/01/2022)   Exercise Vital Sign    Days of Exercise per Week: 3 days    Minutes of Exercise per Session: 40 min  Stress: No Stress Concern Present (11/01/2022)   [place]    Feeling of Stress : Not at all  Social Connections: Moderately Integrated (11/01/2022)   Social Connection and Isolation Panel [NHANES]    Frequency of Communication with Friends and Family: More than three times a week    Frequency of Social Gatherings with Friends and Family: More than three times a week    Attends Religious Services: 1 to 4 times per year    Active Member of [employer] or Organizations: No    Attends Music therapist: Never    Marital Status: Married    Tobacco Counseling Counseling given: Not Answered   Clinical Intake:  Pre-visit preparation completed: Yes  Pain : No/denies pain     Nutritional Risks: None Diabetes: No  How often do you need to have someone help you when you read instructions, pamphlets, or other written materials from your doctor or pharmacy?: 1 - Never  Diabetic?no  Interpreter Needed?: No  Information entered by :: Kirke Shaggy, LPN   Activities of Daily Living    11/01/2022    3:19 PM  In  your present state of health, do you have any difficulty performing the following activities:  Hearing? 0  Vision? 0  Difficulty concentrating or making decisions? 0  Walking or climbing stairs? 0  Dressing or bathing? 0  Doing errands, shopping? 0  Preparing Food and eating ? N  Using the Toilet? N  In the past six months, have you accidently leaked urine? N  Do you have problems with loss of bowel control? N  Managing your Medications? N  Managing your Finances? N  Housekeeping or managing your Housekeeping? N    Patient Care Team: Jinny Sanders, MD as PCP - General (Family Medicine) Nahser, Wonda Cheng, MD  as PCP - Cardiology (Cardiology)  Indicate any recent Medical Services you may have received from other than [facility] providers in the past year (date may be approximate).     Assessment:   This is a routine wellness examination for [facility].  Hearing/Vision screen Hearing Screening - Comments:: No aids Vision Screening - Comments:: No glasses  Dietary issues and exercise activities discussed: Current Exercise Habits: Home exercise routine, Type of exercise: walking, Time (Minutes): 45, Frequency (Times/Week): 3, Weekly Exercise (Minutes/Week): 135, Intensity: Mild   Goals Addressed             This Visit's Progress    DIET - EAT MORE FRUITS AND VEGETABLES         Depression Screen    11/01/2022    3:12 PM 03/29/2022   10:53 AM 10/31/2021   10:51 AM 10/11/2020    3:52 PM 08/31/2019    8:33 AM 06/02/2018   11:21 AM 05/02/2018   11:18 AM  PHQ 2/9 Scores  PHQ - 2 Score 0 0 0 0 0 0 0  PHQ- 9 Score 0   0       Fall Risk    11/01/2022    3:19 PM 03/29/2022   10:53 AM 10/31/2021   10:51 AM 10/11/2020    3:52 PM  [place] in the past year? 0 0 0 0  Number falls in past yr: 0   0  Injury with Fall? 0   0  Risk for fall due to : No Fall Risks   No Fall Risks  Follow up Falls prevention discussed;Falls evaluation completed Falls evaluation completed  Falls  evaluation completed;Falls prevention discussed    FALL RISK PREVENTION PERTAINING TO THE HOME:  Any stairs in or around the home? Yes  If so, are there any without handrails? No  Home free of loose throw rugs in walkways, pet beds, electrical cords, etc? Yes  Adequate lighting in your home to reduce risk of falls? Yes   ASSISTIVE DEVICES UTILIZED TO PREVENT FALLS:  Life alert? No  Use of a cane, walker or w/c? No  Grab bars in the bathroom? No  Shower chair or bench in shower? No  Elevated toilet seat or a handicapped toilet? No   Cognitive Function:    10/11/2020    3:53 PM  MMSE - Mini Mental State Exam  Not completed: Refused        11/01/2022    3:23 PM  6CIT Screen  What Year? 0 points  What month? 0 points  What time? 0 points  Count back from 20 0 points  Months in reverse 0 points  Repeat phrase 0 points  Total Score 0 points    Immunizations Immunization History  Administered Date(s) Administered   Influenza Whole 07/14/2008   Influenza, High Dose Seasonal PF 05/28/2019   Influenza, Quadrivalent, Recombinant, Inj, Pf 07/24/2018   Influenza,inj,Quad PF,6+ Mos 09/16/2015, 07/08/2017   Influenza,inj,quad, With Preservative 09/16/2015   Influenza-Unspecified 08/01/2016, 05/28/2019   Janssen (J&J) SARS-COV-2 Vaccination 12/22/2019   Pneumococcal Conjugate-13 06/02/2019   Pneumococcal Polysaccharide-23 07/12/2020   Tdap 04/02/2022   Zoster Recombinat (Shingrix) 12/02/2017, 07/29/2018    TDAP status: Up to date  Flu Vaccine status: Declined, Education has been provided regarding the importance of this vaccine but patient still declined. Advised may receive this vaccine at local pharmacy or Health Dept. Aware to provide a copy of the vaccination record if obtained from local pharmacy or Health Dept. Verbalized  acceptance and understanding.  Pneumococcal vaccine status: Up to date  Covid-19 vaccine status: Declined, Education has been provided regarding the  importance of this vaccine but patient still declined. Advised may receive this vaccine at local pharmacy or Health Dept.or vaccine clinic. Aware to provide a copy of the vaccination record if obtained from local pharmacy or Health Dept. Verbalized acceptance and understanding.  Qualifies for Shingles Vaccine? Yes   Zostavax completed No   Shingrix Completed?: Yes  Screening Tests Health Maintenance  Topic Date Due   COVID-19 Vaccine (2 - 2023-24 season) 06/08/2022   INFLUENZA VACCINE  01/06/2023 (Originally 05/08/2022)   Medicare Annual Wellness (AWV)  11/02/2023   COLONOSCOPY (Pts 45-64yr Insurance coverage will need to be confirmed)  01/18/2028   DTaP/Tdap/Td (2 - Td or Tdap) 04/02/2032   Pneumonia Vaccine 67 Years old  Completed   Hepatitis C Screening  Completed   Zoster Vaccines- Shingrix  Completed   HPV VACCINES  Aged Out    Health Maintenance  Health Maintenance Due  Topic Date Due   COVID-19 Vaccine (2 - 2023-24 season) 06/08/2022    Colorectal cancer screening: Type of screening: Colonoscopy. Completed 01/17/18. Repeat every 10 years  Lung Cancer Screening: (Low Dose CT Chest recommended if Age 69-80years, 30 pack-year currently smoking OR have quit w/in 15years.) does not qualify.    Additional Screening:  Hepatitis C Screening: does qualify; Completed 09/28/22  Vision Screening: Recommended annual ophthalmology exams for early detection of glaucoma and other disorders of the eye. Is the patient up to date with their annual eye exam?  No  Who is the provider or what is the name of the office in which the patient attends annual eye exams? No one If pt is not established with a provider, would they like to be referred to a provider to establish care? No .   Dental Screening: Recommended annual dental exams for proper oral hygiene  Community Resource Referral / Chronic Care Management: CRR required this visit?  No   CCM required this visit?  No      Plan:      I have personally reviewed and noted the following in the patient's chart:   Medical and social history Use of alcohol, tobacco or illicit drugs  Current medications and supplements including opioid prescriptions. Patient is not currently taking opioid prescriptions. Functional ability and status Nutritional status Physical activity Advanced directives List of other physicians Hospitalizations, surgeries, and ER visits in previous 12 months Vitals Screenings to include cognitive, depression, and falls Referrals and appointments  In addition, I have reviewed and discussed with patient certain preventive protocols, quality metrics, and best practice recommendations. A written personalized care plan for preventive services as well as general preventive health recommendations were provided to patient.     LDionisio David LPN   [id number]  Nurse Notes: none

## 2022-11-06 ENCOUNTER — Ambulatory Visit (INDEPENDENT_AMBULATORY_CARE_PROVIDER_SITE_OTHER): Payer: Medicare Other | Admitting: Family Medicine

## 2022-11-06 ENCOUNTER — Encounter: Payer: Self-pay | Admitting: Family Medicine

## 2022-11-06 VITALS — BP 110/80 | HR 58 | Temp 97.6°F | Ht 66.5 in | Wt 179.5 lb

## 2022-11-06 DIAGNOSIS — K8020 Calculus of gallbladder without cholecystitis without obstruction: Secondary | ICD-10-CM

## 2022-11-06 DIAGNOSIS — I10 Essential (primary) hypertension: Secondary | ICD-10-CM | POA: Diagnosis not present

## 2022-11-06 DIAGNOSIS — I480 Paroxysmal atrial fibrillation: Secondary | ICD-10-CM | POA: Diagnosis not present

## 2022-11-06 DIAGNOSIS — Z131 Encounter for screening for diabetes mellitus: Secondary | ICD-10-CM

## 2022-11-06 DIAGNOSIS — E039 Hypothyroidism, unspecified: Secondary | ICD-10-CM

## 2022-11-06 DIAGNOSIS — I5032 Chronic diastolic (congestive) heart failure: Secondary | ICD-10-CM | POA: Diagnosis not present

## 2022-11-06 DIAGNOSIS — R1084 Generalized abdominal pain: Secondary | ICD-10-CM

## 2022-11-06 DIAGNOSIS — R7989 Other specified abnormal findings of blood chemistry: Secondary | ICD-10-CM

## 2022-11-06 DIAGNOSIS — Z125 Encounter for screening for malignant neoplasm of prostate: Secondary | ICD-10-CM

## 2022-11-06 NOTE — Assessment & Plan Note (Signed)
Unclear etiology. Evaluation revealed both elevated liver function tests, fatty liver on ultrasound (not on CT), gallstones as well as a possible vasculitis.  Of note sed rate was in the normal range making true vasculitis less likely, doubtful temporal arteritis.  Patient not interested in rheumatology referral at this time.  Patient states his symptoms have improved dramatically with low  to 0 carb diet. Given this we will check for celiac disease with blood work.

## 2022-11-06 NOTE — Assessment & Plan Note (Signed)
Chronic, followed by cardiology.  On Eliquis anticoagulation. Per patient decreased episodes of palpitations now on magnesium.

## 2022-11-06 NOTE — Assessment & Plan Note (Addendum)
Unclear if etiology is secondary to fatty liver versus gallstone.  Will reevaluate with significant weight loss and low-carb diet.

## 2022-11-06 NOTE — Progress Notes (Signed)
Patient ID: Alex Huffman, male    DOB: [birth year], 67 y.o.   MRN: [id number]  This visit was conducted in person.  BP 110/80   Pulse (!) 58   Temp 97.6 F (36.4 C) (Temporal)   Ht 5' 6.5" (1.689 m)   Wt 179 lb 8 oz (81.4 kg)   SpO2 97%   BMI 28.54 kg/m    CC:  Chief Complaint  Patient presents with   Annual Exam    Part 2    Subjective:   HPI: Alex Huffman is a 67 y.o. male presenting on 11/06/2022 for Annual Exam (Part 2)  The patient presents for  review of chronic health problems. He also has the following acute concerns today:  The patient saw a LPN or RN for medicare wellness visit.  Prevention and wellness was reviewed in detail. Note reviewed and important notes copied below.  Hypertension:  Improved control on no medication. BP Readings from Last 3 Encounters:  11/06/22 110/80  09/25/22 (!) 140/86  06/28/22 (!) 158/102  Using medication without problems or lightheadedness: none Chest pain with exertion:none Edema: none Short of breath:none Average home BPs: Other issues:  Chronic diastolic heart failure: followed by cardiology. No fluid overload. Paroxsymal atrial fibrillation on Eliquis anticoagulation.  Hypothyroidism:  Due for re-eval. Lab Results  Component Value Date   TSH 2.65 09/25/2022    Was having abdominal pain, reviewed last labs , x-rays from end of 2023 AST and ALT elevated 128 and 301 respectively.Marland Kitchen decreased to 65 and 206  [place] 09/26/22 : Heterogeneous liver often attributable to fatty infiltration.  Cholelithiasis. Gallbladder region tenderness but no inflammatory changes to imply cholecystitis. GGT was 173. No ETOH, neg hep panel  Had abnormal CT abdomen  09/28/22 showing MPRESSION: 1. 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 The vessel remains patent although with some associated stenosis. Negative for associated pseudoaneurysm. Suspect this is the symptomatic  abnormality. But no other vasculitis is identified in the abdomen or pelvis. And only mild for age aortic atherosclerosis.   2. No other acute or inflammatory process identified in the abdomen or pelvis. Chronic left nephrolithiasis, cholecystitis, distal large bowel diverticulosis.  3.Chronic 6-7 mm gallstone within the neck of the gallbladder, but no gallbladder distension or regional inflammation.  Sed rate was normal  He reports he has been eating only meat and veggies, low carb.  Pain in abdomen has improved. He noted stomach ache after restarting spaghetti, noodles, and  donut  Steak and hamburger has not caused much symptoms.     Wt Readings from Last 3 Encounters:  11/06/22 179 lb 8 oz (81.4 kg)  11/01/22 185 lb (83.9 kg)  09/25/22 185 lb 2 oz (84 kg)  He has been working on [employer].  Relevant past medical, surgical, family and social history reviewed and updated as indicated. Interim medical history since our last visit reviewed. Allergies and medications reviewed and updated. Outpatient Medications Prior to Visit  Medication Sig Dispense Refill   apixaban (ELIQUIS) 5 MG TABS tablet Take 1 tablet (5 mg total) by mouth 2 (two) times daily. 60 tablet 5   levothyroxine (SYNTHROID) 25 MCG tablet Take 1 tablet by mouth once daily 90 tablet 0   No facility-administered medications prior to visit.     Per HPI unless specifically indicated in ROS section below Review of Systems  Constitutional:  Negative for fatigue and fever.  HENT:  Negative for ear  pain.   Eyes:  Negative for pain.  Respiratory:  Negative for cough and shortness of breath.   Cardiovascular:  Negative for chest pain, palpitations and leg swelling.  Gastrointestinal:  Negative for abdominal pain.  Genitourinary:  Negative for dysuria.  Musculoskeletal:  Negative for arthralgias.  Neurological:  Negative for syncope, light-headedness and headaches.  Psychiatric/Behavioral:  Negative for  dysphoric mood.     Objective:  BP 110/80   Pulse (!) 58   Temp 97.6 F (36.4 C) (Temporal)   Ht 5' 6.5" (1.689 m)   Wt 179 lb 8 oz (81.4 kg)   SpO2 97%   BMI 28.54 kg/m   Wt Readings from Last 3 Encounters:  11/06/22 179 lb 8 oz (81.4 kg)  11/01/22 185 lb (83.9 kg)  09/25/22 185 lb 2 oz (84 kg)      Physical Exam Constitutional:      Appearance: He is well-developed.  HENT:     Head: Normocephalic.     Right Ear: Hearing normal.     Left Ear: Hearing normal.     Nose: Nose normal.  Neck:     Thyroid: No thyroid mass or thyromegaly.     Vascular: No carotid bruit.     Trachea: Trachea normal.  Cardiovascular:     Rate and Rhythm: Normal rate and regular rhythm.     Pulses: Normal pulses.     Heart sounds: Heart sounds not distant. No murmur heard.    No friction rub. No gallop.     Comments: No peripheral edema Pulmonary:     Effort: Pulmonary effort is normal. No respiratory distress.     Breath sounds: Normal breath sounds.  Abdominal:     Tenderness: There is generalized abdominal tenderness.  Skin:    General: Skin is warm and dry.     Findings: No rash.  Psychiatric:        Speech: Speech normal.        Behavior: Behavior normal.        Thought Content: Thought content normal.       Results for orders placed or performed in visit on 10/12/22  Sedimentation rate  Result Value Ref Range   Sed Rate 15 0 - 20 mm/hr    Assessment and Plan The patient's preventative maintenance and recommended screening tests for an annual wellness exam were reviewed in full today. Brought up to date unless services declined.  Counselled on the importance of diet, exercise, and its role in overall health and mortality. The patient's FH and SH was reviewed, including their home life, tobacco status, and drug and alcohol status.    Vaccines: COVID x 1 JJ, refused flu (refuses), Td,  uptodate with shingrix Prostate Cancer Screen: due Colon Cancer Screen:  2019 Dr.  Carlean Purl,  repeat due in 10 years      Smoking Status: none ETOH/ drug use: none/none Hep C:  done   Primary hypertension Assessment & Plan: Stable, chronic.  On no medication.  Has improved with weight loss.   Orders: -     Lipid panel; Future  Chronic diastolic congestive heart failure (HCC) Assessment & Plan: Stable, chronic.  Continue current medication.  Euvolemic in office today   Paroxysmal atrial fibrillation [facility]) Assessment & Plan: Chronic, followed by cardiology.  On Eliquis anticoagulation. Per patient decreased episodes of palpitations now on magnesium.   Generalized abdominal pain Assessment & Plan: Unclear etiology. Evaluation revealed both elevated liver function tests, fatty liver on ultrasound (not  on CT), gallstones as well as a possible vasculitis.  Of note sed rate was in the normal range making true vasculitis less likely, doubtful temporal arteritis.  Patient not interested in rheumatology referral at this time.  Patient states his symptoms have improved dramatically with low  to 0 carb diet. Given this we will check for celiac disease with blood work.   Orders: -     Tissue Transglutaminase Abs,IgG,IgA; Future  Elevated LFTs Assessment & Plan: Unclear if etiology is secondary to fatty liver versus gallstone.  Will reevaluate with significant weight loss and low-carb diet.  Orders: -     Comprehensive metabolic panel; Future  Acquired hypothyroidism Assessment & Plan: Due for reevaluation  Orders: -     T4, free; Future -     T3, free; Future -     TSH; Future  Prostate cancer screening -     PSA, Medicare; Future  Screening for diabetes mellitus  Calculus of gallbladder without cholecystitis without obstruction Assessment & Plan: Noted on CT scan as well as ultrasound. Possibly contributing to abdominal pain.  Patient has held off on referral to surgeon at this point. Unclear if contributing to elevated liver function test.  We  will reevaluate.  Orders: -     Gamma GT; Future     Return in about 2 weeks (around 11/20/2022) for lab only.   Eliezer Lofts, MD

## 2022-11-06 NOTE — Assessment & Plan Note (Signed)
Noted on CT scan as well as ultrasound. Possibly contributing to abdominal pain.  Patient has held off on referral to surgeon at this point. Unclear if contributing to elevated liver function test.  We will reevaluate.

## 2022-11-06 NOTE — Patient Instructions (Signed)
Continue low carb diet

## 2022-11-06 NOTE — Assessment & Plan Note (Signed)
Stable, chronic.  Continue current medication.  Euvolemic in office today

## 2022-11-06 NOTE — Assessment & Plan Note (Signed)
Due for reevaluation 

## 2022-11-06 NOTE — Assessment & Plan Note (Signed)
Stable, chronic.  On no medication.  Has improved with weight loss.

## 2022-11-12 ENCOUNTER — Telehealth: Payer: Self-pay | Admitting: Family Medicine

## 2022-11-12 DIAGNOSIS — E039 Hypothyroidism, unspecified: Secondary | ICD-10-CM

## 2022-11-12 NOTE — Telephone Encounter (Signed)
Patient called in to follow up on this medication refill. Informed patient Dr. Diona Browner is out today and will return tomorrow.

## 2022-11-12 NOTE — Telephone Encounter (Signed)
Mr. Stephanie notified by telephone that refills have been sent in to [facility] on [address].

## 2022-12-03 IMAGING — DX DG CHEST 1V PORT
1 series · 1 of 1 positions shown · non-contrast
Comparison: None.

CLINICAL DATA: 64-year-old male with increasing shortness of
breath.

EXAM:
PORTABLE CHEST 1 VIEW

[chest ap]
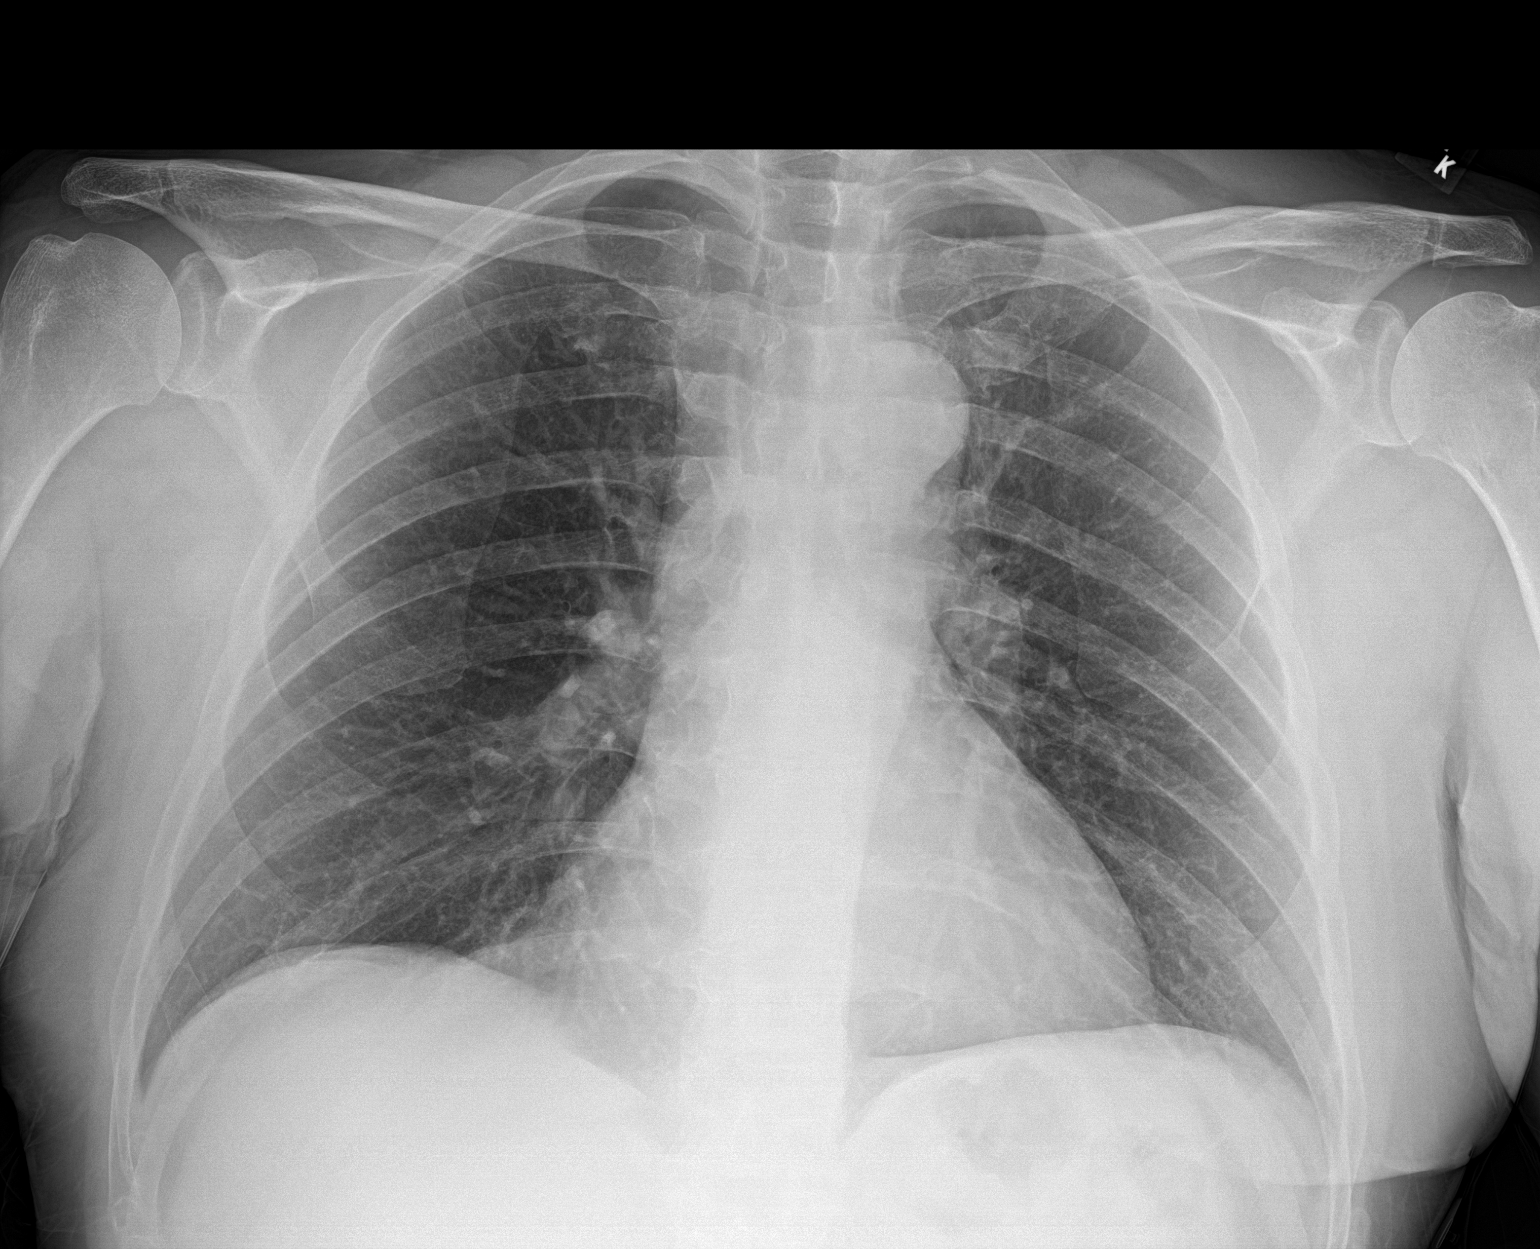

[1 of 1 positions shown; findings below may reference images not displayed]

FINDINGS: The heart size and mediastinal contours are within normal limits.
Both lungs are clear. The visualized skeletal structures are
unremarkable.
IMPRESSION: No acute cardiopulmonary process.

## 2022-12-14 ENCOUNTER — Other Ambulatory Visit (INDEPENDENT_AMBULATORY_CARE_PROVIDER_SITE_OTHER): Payer: Medicare Other

## 2022-12-14 DIAGNOSIS — R7989 Other specified abnormal findings of blood chemistry: Secondary | ICD-10-CM

## 2022-12-14 DIAGNOSIS — R1084 Generalized abdominal pain: Secondary | ICD-10-CM | POA: Diagnosis not present

## 2022-12-14 DIAGNOSIS — E039 Hypothyroidism, unspecified: Secondary | ICD-10-CM

## 2022-12-14 DIAGNOSIS — I10 Essential (primary) hypertension: Secondary | ICD-10-CM

## 2022-12-14 DIAGNOSIS — Z125 Encounter for screening for malignant neoplasm of prostate: Secondary | ICD-10-CM

## 2022-12-14 DIAGNOSIS — K8020 Calculus of gallbladder without cholecystitis without obstruction: Secondary | ICD-10-CM

## 2022-12-14 DIAGNOSIS — R972 Elevated prostate specific antigen [PSA]: Secondary | ICD-10-CM

## 2022-12-14 NOTE — Addendum Note (Signed)
Addended by: Ellamae Sia on: 12/14/2022 03:18 PM   Modules accepted: Orders

## 2022-12-15 ENCOUNTER — Encounter: Payer: Self-pay | Admitting: Family Medicine

## 2022-12-15 LAB — COMPLETE METABOLIC PANEL WITH GFR
AG Ratio: 1.6 (calc) (ref 1.0–2.5)
ALT: 32 U/L (ref 9–46)
AST: 21 U/L (ref 10–35)
Albumin: 4.2 g/dL (ref 3.6–5.1)
Alkaline phosphatase (APISO): 59 U/L (ref 35–144)
BUN/Creatinine Ratio: 22 (calc) (ref 6–22)
BUN: 28 mg/dL — ABNORMAL HIGH (ref 7–25)
CO2: 25 mmol/L (ref 20–32)
Calcium: 9.5 mg/dL (ref 8.6–10.3)
Chloride: 107 mmol/L (ref 98–110)
Creat: 1.27 mg/dL (ref 0.70–1.35)
Globulin: 2.7 g/dL (calc) (ref 1.9–3.7)
Glucose, Bld: 102 mg/dL — ABNORMAL HIGH (ref 65–99)
Potassium: 4.8 mmol/L (ref 3.5–5.3)
Sodium: 142 mmol/L (ref 135–146)
Total Bilirubin: 0.6 mg/dL (ref 0.2–1.2)
Total Protein: 6.9 g/dL (ref 6.1–8.1)
eGFR: 62 mL/min/{1.73_m2} (ref >=60)

## 2022-12-15 LAB — PSA: PSA: 5.57 ng/mL — ABNORMAL HIGH (ref <=4.00)

## 2022-12-15 LAB — LIPID PANEL
Cholesterol: 191 mg/dL (ref <200)
HDL: 52 mg/dL (ref >=40)
LDL Cholesterol (Calc): 114 mg/dL (calc) — ABNORMAL HIGH
Non-HDL Cholesterol (Calc): 139 mg/dL (calc) — ABNORMAL HIGH (ref <130)
Total CHOL/HDL Ratio: 3.7 (calc) (ref <5.0)
Triglycerides: 140 mg/dL (ref <150)

## 2022-12-15 LAB — TISSUE TRANSGLUTAMINASE ABS,IGG,IGA
(tTG) Ab, IgA: <1 U/mL
(tTG) Ab, IgG: <1 U/mL

## 2022-12-15 LAB — GAMMA GT: GGT: 52 U/L (ref 3–70)

## 2022-12-15 LAB — T3, FREE: T3, Free: 3 pg/mL (ref 2.3–4.2)

## 2022-12-15 LAB — T4, FREE: Free T4: 1 ng/dL (ref 0.8–1.8)

## 2022-12-15 LAB — TSH: TSH: 3.03 mIU/L (ref 0.40–4.50)

## 2023-01-31 ENCOUNTER — Encounter: Payer: Self-pay | Admitting: Family Medicine

## 2023-02-18 ENCOUNTER — Encounter: Payer: Self-pay | Admitting: Family Medicine

## 2023-02-18 DIAGNOSIS — R972 Elevated prostate specific antigen [PSA]: Secondary | ICD-10-CM

## 2023-02-18 DIAGNOSIS — N401 Enlarged prostate with lower urinary tract symptoms: Secondary | ICD-10-CM

## 2023-02-21 ENCOUNTER — Telehealth: Payer: Self-pay | Admitting: Family Medicine

## 2023-02-21 NOTE — Telephone Encounter (Signed)
Referral order shows referral was sent this morning and patient was contacted by A. Chilton Si

## 2023-02-21 NOTE — Telephone Encounter (Signed)
Patient would like to know the status of urology referral,he would like to know the reason for the status of "pending"? Patient is requesting a phone call .

## 2023-04-01 ENCOUNTER — Encounter: Payer: Self-pay | Admitting: Family Medicine

## 2023-04-03 ENCOUNTER — Ambulatory Visit (INDEPENDENT_AMBULATORY_CARE_PROVIDER_SITE_OTHER): Payer: Medicare Other | Admitting: Family Medicine

## 2023-04-03 ENCOUNTER — Encounter: Payer: Self-pay | Admitting: Family Medicine

## 2023-04-03 VITALS — BP 104/80 | HR 68 | Temp 98.0°F | Ht 66.5 in | Wt 160.0 lb

## 2023-04-03 DIAGNOSIS — R1032 Left lower quadrant pain: Secondary | ICD-10-CM | POA: Diagnosis not present

## 2023-04-03 DIAGNOSIS — K6289 Other specified diseases of anus and rectum: Secondary | ICD-10-CM | POA: Diagnosis not present

## 2023-04-03 MED ORDER — AMOXICILLIN-POT CLAVULANATE 875-125 MG PO TABS: 1.0000 | ORAL_TABLET | Freq: Two times a day (BID) | ORAL | 0 refills | Status: DC

## 2023-04-03 NOTE — Patient Instructions (Signed)
Push fluids.  Complete antibiotics. Take a probiotic to repopulate bowl flora.  If pain in abdomen not improving in 48.. call If vomitiing and unable to keep down antibiotics or if fever on antibiotics > 100.63F go to ER.

## 2023-04-03 NOTE — Progress Notes (Signed)
Patient ID: Alex Huffman, male    DOB: 1956/05/12, 67 y.o.   MRN: [id number]  This visit was conducted in person.  BP 104/80 (BP Location: Left Arm, Patient Position: Sitting, Cuff Size: Normal)   Pulse 68   Temp 98 F (36.7 C) (Temporal)   Ht 5' 6.5" (1.689 m)   Wt 160 lb (72.6 kg)   SpO2 97%   BMI 25.44 kg/m    CC:  Chief Complaint  Patient presents with   Diverticulitis    Patient has been having a flare since last week. He did a liquid diet x 3 days.    Subjective:   HPI: Alex Huffman is a 67 y.o. male presenting on 04/03/2023 for Diverticulitis (Patient has been having a flare since last week. He did a liquid diet x 3 days.)   LLQ pain similar to diverticulitis he has had in the past.  Also initially had pain on right.. now primarily on left.  Initially had low grade temp of 99.25F.  Pain started 6 days ago.  Initially pain severe.. he started liquid diet.... pain improved some but not resolved.  Yesterday when he tried to restart solids.. rice and  banana   Last BM yesterday ( small amount slightly watery), no blood in stool. No flu like symptoms.   He is Nauseous, but no V.  He has been eating more salad lately to be healthy .Marland Kitchen Has lost 30 lbs. He has been eating more raw veggies, flax seed, chia and peanuts. Body mass index is 25.44 kg/m.  Wt Readings from Last 3 Encounters:  04/03/23 160 lb (72.6 kg)  11/06/22 179 lb 8 oz (81.4 kg)  11/01/22 185 lb (83.9 kg)    Patient noted he did take 3 days of Cipro more than a week ago for chronic prostatitis, stopped given he was to wear a heart monitor to look into A-fib episodes.  He feels that Cipro may be related to some of his A-fib episodes in the past.  Of note he remarks that several months ago he had an episode of severe rectal pain, lasted a few days and now is resolved. He asks about when his last colonoscopy was and if he needs to repeat this. Last colonoscopy 2019, 10-year recall.     Relevant  past medical, surgical, family and social history reviewed and updated as indicated. Interim medical history since our last visit reviewed. Allergies and medications reviewed and updated. Outpatient Medications Prior to Visit  Medication Sig Dispense Refill   apixaban (ELIQUIS) 5 MG TABS tablet Take 1 tablet (5 mg total) by mouth 2 (two) times daily. 60 tablet 5   levothyroxine (SYNTHROID) 25 MCG tablet Take 1 tablet by mouth once daily 90 tablet 3   No facility-administered medications prior to visit.     Per HPI unless specifically indicated in ROS section below Review of Systems  Constitutional:  Negative for fatigue and fever.  HENT:  Negative for ear pain.   Eyes:  Negative for pain.  Respiratory:  Negative for cough and shortness of breath.   Cardiovascular:  Negative for chest pain, palpitations and leg swelling.  Gastrointestinal:  Negative for abdominal pain.  Genitourinary:  Negative for dysuria.  Musculoskeletal:  Negative for arthralgias.  Neurological:  Negative for syncope, light-headedness and headaches.  Psychiatric/Behavioral:  Negative for dysphoric mood.    Objective:  BP 104/80 (BP Location: Left Arm, Patient Position: Sitting, Cuff Size: Normal)   Pulse 68  Temp 98 F (36.7 C) (Temporal)   Ht 5' 6.5" (1.689 m)   Wt 160 lb (72.6 kg)   SpO2 97%   BMI 25.44 kg/m   Wt Readings from Last 3 Encounters:  04/03/23 160 lb (72.6 kg)  11/06/22 179 lb 8 oz (81.4 kg)  11/01/22 185 lb (83.9 kg)      Physical Exam Constitutional:      Appearance: He is well-developed.  HENT:     Head: Normocephalic.     Right Ear: Hearing normal.     Left Ear: Hearing normal.     Nose: Nose normal.  Neck:     Thyroid: No thyroid mass or thyromegaly.     Vascular: No carotid bruit.     Trachea: Trachea normal.  Cardiovascular:     Rate and Rhythm: Normal rate and regular rhythm.     Pulses: Normal pulses.     Heart sounds: Heart sounds not distant. No murmur heard.    No  friction rub. No gallop.     Comments: No peripheral edema Pulmonary:     Effort: Pulmonary effort is normal. No respiratory distress.     Breath sounds: Normal breath sounds.  Abdominal:     Tenderness: There is abdominal tenderness in the left upper quadrant and left lower quadrant. There is guarding and rebound.  Skin:    General: Skin is warm and dry.     Findings: No rash.  Psychiatric:        Speech: Speech normal.        Behavior: Behavior normal.        Thought Content: Thought content normal.       Results for orders placed or performed in visit on 12/14/22  Tissue Transglutaminase Abs,IgG,IgA  Result Value Ref Range   (tTG) Ab, IgG <1.0 U/mL   (tTG) Ab, IgA <1.0 U/mL  COMPLETE METABOLIC PANEL WITH GFR  Result Value Ref Range   Glucose, Bld 102 (H) 65 - 99 mg/dL   BUN 28 (H) 7 - 25 mg/dL   Creat 1.61 0.96 - 0.45 mg/dL   eGFR 62 > OR = 60 WU/JWJ/1.91Y7   BUN/Creatinine Ratio 22 6 - 22 (calc)   Sodium 142 135 - 146 mmol/L   Potassium 4.8 3.5 - 5.3 mmol/L   Chloride 107 98 - 110 mmol/L   CO2 25 20 - 32 mmol/L   Calcium 9.5 8.6 - 10.3 mg/dL   Total Protein 6.9 6.1 - 8.1 g/dL   Albumin 4.2 3.6 - 5.1 g/dL   Globulin 2.7 1.9 - 3.7 g/dL (calc)   AG Ratio 1.6 1.0 - 2.5 (calc)   Total Bilirubin 0.6 0.2 - 1.2 mg/dL   Alkaline phosphatase (APISO) 59 35 - 144 U/L   AST 21 10 - 35 U/L   ALT 32 9 - 46 U/L  Gamma GT  Result Value Ref Range   GGT 52 3 - 70 U/L  T3, free  Result Value Ref Range   T3, Free 3.0 2.3 - 4.2 pg/mL  T4, free  Result Value Ref Range   Free T4 1.0 0.8 - 1.8 ng/dL  TSH  Result Value Ref Range   TSH 3.03 0.40 - 4.50 mIU/L  PSA  Result Value Ref Range   PSA 5.57 (H) < OR = 4.00 ng/mL  Lipid panel  Result Value Ref Range   Cholesterol 191 <200 mg/dL   HDL 52 > OR = 40 mg/dL   Triglycerides 829 <562 mg/dL  LDL Cholesterol (Calc) 114 (H) mg/dL (calc)   Total CHOL/HDL Ratio 3.7 <5.0 (calc)   Non-HDL Cholesterol (Calc) 139 (H) <130 mg/dL (calc)     Assessment and Plan  LLQ pain Assessment & Plan: Acute, some improvement with bowel rest.  Given persistent symptoms will treat with Augmentin twice daily x 10 days.  Push fluids, bowel rest and advance diet as tolerated. Return and ER precautions provided.    Rectal pain  Other orders -     Amoxicillin-Pot Clavulanate; Take 1 tablet by mouth 2 (two) times daily.  Dispense: 20 tablet; Refill: 0    No follow-ups on file.   Kerby Nora, MD

## 2023-04-03 NOTE — Assessment & Plan Note (Signed)
Acute, some improvement with bowel rest.  Given persistent symptoms will treat with Augmentin twice daily x 10 days.  Push fluids, bowel rest and advance diet as tolerated. Return and ER precautions provided.

## 2023-05-08 NOTE — Telephone Encounter (Signed)
Referral updated with appt information

## 2023-07-10 ENCOUNTER — Other Ambulatory Visit (HOSPITAL_COMMUNITY): Payer: Self-pay

## 2023-09-23 ENCOUNTER — Ambulatory Visit
Admission: RE | Admit: 2023-09-23 | Discharge: 2023-09-23 | Disposition: A | Discharge status: Home / Self Care | Payer: Medicare Other | Source: Ambulatory Visit | Attending: Internal Medicine | Admitting: Internal Medicine

## 2023-09-23 ENCOUNTER — Encounter: Payer: Self-pay | Admitting: Internal Medicine

## 2023-09-23 ENCOUNTER — Ambulatory Visit (INDEPENDENT_AMBULATORY_CARE_PROVIDER_SITE_OTHER): Payer: Medicare Other | Admitting: Internal Medicine

## 2023-09-23 VITALS — BP 138/80 | HR 78 | Temp 97.9°F | Ht 66.5 in | Wt 168.0 lb

## 2023-09-23 DIAGNOSIS — R051 Acute cough: Secondary | ICD-10-CM

## 2023-09-23 DIAGNOSIS — I480 Paroxysmal atrial fibrillation: Secondary | ICD-10-CM | POA: Diagnosis not present

## 2023-09-23 DIAGNOSIS — R059 Cough, unspecified: Secondary | ICD-10-CM | POA: Insufficient documentation

## 2023-09-23 MED ORDER — AZITHROMYCIN 250 MG PO TABS: ORAL_TABLET | ORAL | 0 refills | Status: DC

## 2023-09-23 NOTE — Assessment & Plan Note (Addendum)
Going back a month Prominent chest symptoms Will check CXR---may have increased interstitial markings in right lower lung fields Will try empiric azithromycin If ongoing sinus symptoms--could add augmentin

## 2023-09-23 NOTE — Progress Notes (Signed)
Subjective:    Patient ID: Alex Huffman, male    DOB: 1956/04/24, 67 y.o.   MRN: [id number]  HPI Here due to respiratory infection  May have started with a cold--like a month ago Nasal drainage Building up mucus and sore throat--then cough Seemed some better and now worsening again in the past week Coughing a lot Low grade fever Can feel abnormal breathing---"like a feeling of chlorine in my chest" Feels chest congestion---but only minimal production Lots of nasal drainage Does feel SOB and a wheeze when lying in bed at night  Has tried rest, vitamins  Current Outpatient Medications on File Prior to Visit  Medication Sig Dispense Refill   apixaban (ELIQUIS) 5 MG TABS tablet Take 1 tablet (5 mg total) by mouth 2 (two) times daily. 60 tablet 5   diltiazem (CARDIZEM) 30 MG tablet Take 30 mg by mouth as directed.     levothyroxine (SYNTHROID) 25 MCG tablet Take 1 tablet by mouth once daily 90 tablet 3   No current facility-administered medications on file prior to visit.    Allergies  Allergen Reactions   Typhoid Vaccine, Live Other (See Comments) and Anaphylaxis   Typhoid Vi Polysaccharide Vaccine Anaphylaxis and Other (See Comments)    Comatose, almost died    Amlodipine Other (See Comments)    Leg pain and fatigue   Atorvastatin Diarrhea    Body ache   Doxycycline Other (See Comments)    Unknown reaction   Nitrofurantoin Other (See Comments) and Nausea And Vomiting   Sulfa Antibiotics Hives    Past Medical History:  Diagnosis Date   Anxiety    Atrial fibrillation (HCC)    Cataract    Chronic headaches    Helicobacter pylori gastritis 2009   EGD   Hyperlipemia    Personal history of colonic polyps     Past Surgical History:  Procedure Laterality Date   CARDIOVERSION N/A 11/09/2020   Procedure: CARDIOVERSION;  Surgeon: Sande Rives, MD;  Location: [facility] ENDOSCOPY;  Service: Cardiovascular;  Laterality: N/A;   COLONOSCOPY  , 06/08/2011   2009: two 3  mm hyperplastic polyps, lipoma, internal hemorrjhoids 2012: 4mm cecal serrated adenoma, lipoma, internal hemorrhoids   HERNIA REPAIR  05/04   ROTATOR CUFF REPAIR  05/06   UPPER GASTROINTESTINAL ENDOSCOPY  02/02/2008   erosive gastropathy    Family History  Problem Relation Age of Onset   Heart disease Father    Celiac disease Mother    Colon cancer Neg Hx    Esophageal cancer Neg Hx    Liver cancer Neg Hx    Pancreatic cancer Neg Hx    Rectal cancer Neg Hx    Stomach cancer Neg Hx    Allergic rhinitis Neg Hx     Social History   Socioeconomic History   Marital status: Married    Spouse name: Not on file   Number of children: 3   Years of education: Not on file   Highest education level: Not on file  Occupational History   Occupation: Retired  Tobacco Use   Smoking status: Never   Smokeless tobacco: Never  Vaping Use   Vaping status: Never Used  Substance and Sexual Activity   Alcohol use: Not Currently    Comment: Occasional beer   Drug use: No   Sexual activity: Yes    Partners: Female    Birth control/protection: None  Other Topics Concern   Not on file  Social History Narrative   Not  on file   Social Drivers of Health   Financial Resource Strain: Low Risk  (11/01/2022)   Overall Financial Resource Strain (CARDIA)    Difficulty of Paying Living Expenses: Not hard at all  Food Insecurity: No Food Insecurity (11/01/2022)   Hunger Vital Sign    Worried About Running Out of Food in the Last Year: Never true    Ran Out of Food in the Last Year: Never true  Transportation Needs: No Transportation Needs (11/01/2022)   PRAPARE - Administrator, Civil Service (Medical): No    Lack of Transportation (Non-Medical): No  Physical Activity: Insufficiently Active (11/01/2022)   Exercise Vital Sign    Days of Exercise per Week: 3 days    Minutes of Exercise per Session: 40 min  Stress: No Stress Concern Present (11/01/2022)   [employer] of Occupational  Health - Occupational Stress Questionnaire    Feeling of Stress : Not at all  Social Connections: Moderately Integrated (11/01/2022)   Social Connection and Isolation Panel [NHANES]    Frequency of Communication with Friends and Family: More than three times a week    Frequency of Social Gatherings with Friends and Family: More than three times a week    Attends Religious Services: 1 to 4 times per year    Active Member of [employer] or Organizations: No    Attends Banker Meetings: Never    Marital Status: Married  Catering manager Violence: Not At Risk (11/01/2022)   Humiliation, Afraid, Rape, and Kick questionnaire    Fear of Current or Ex-Partner: No    Emotionally Abused: No    Physically Abused: No    Sexually Abused: No   Review of Systems Has chronic atrial fib---has had 2 ablations Heart rate is much faster than usual (70's instead of 50's) Does feel irregular    Objective:   Physical Exam Constitutional:      Appearance: Normal appearance.  HENT:     Head:     Comments: Maxillary and frontal tenderness    Right Ear: Tympanic membrane and ear canal normal.     Left Ear: Tympanic membrane and ear canal normal.     Mouth/Throat:     Pharynx: No oropharyngeal exudate or posterior oropharyngeal erythema.  Cardiovascular:     Rate and Rhythm: Rhythm irregular.     Heart sounds: No murmur heard.    No gallop.  Pulmonary:     Effort: Pulmonary effort is normal.     Breath sounds: Normal breath sounds. No wheezing or rales.  Musculoskeletal:     Cervical back: Neck supple.  Lymphadenopathy:     Cervical: No cervical adenopathy.  Neurological:     Mental Status: He is alert.            Assessment & Plan:

## 2023-09-23 NOTE — Assessment & Plan Note (Signed)
Back in atrial fib but the rate is okay He will let his cardiologist know Has the diltiazem for prn use Is on the eliquis 5 bid

## 2023-10-23 ENCOUNTER — Ambulatory Visit (INDEPENDENT_AMBULATORY_CARE_PROVIDER_SITE_OTHER): Payer: Medicare Other | Admitting: Urology

## 2023-10-23 VITALS — BP 180/105 | HR 60 | Ht 67.0 in | Wt 176.1 lb

## 2023-10-23 DIAGNOSIS — N138 Other obstructive and reflux uropathy: Secondary | ICD-10-CM | POA: Diagnosis not present

## 2023-10-23 DIAGNOSIS — N401 Enlarged prostate with lower urinary tract symptoms: Secondary | ICD-10-CM | POA: Diagnosis not present

## 2023-10-23 DIAGNOSIS — R972 Elevated prostate specific antigen [PSA]: Secondary | ICD-10-CM

## 2023-10-23 LAB — URINALYSIS, COMPLETE
Bilirubin, UA: NEGATIVE
Glucose, UA: NEGATIVE
Ketones, UA: NEGATIVE
Leukocytes,UA: NEGATIVE
Nitrite, UA: NEGATIVE
Protein,UA: NEGATIVE
RBC, UA: NEGATIVE
Specific Gravity, UA: 1.025 (ref 1.005–1.030)
Urobilinogen, Ur: 0.2 mg/dL (ref 0.2–1.0)
pH, UA: 6 (ref 5.0–7.5)

## 2023-10-23 LAB — MICROSCOPIC EXAMINATION: Bacteria, UA: NONE SEEN

## 2023-10-23 LAB — BLADDER SCAN AMB NON-IMAGING: Scan Result: 109

## 2023-10-23 NOTE — Patient Instructions (Signed)
Prostate MRI Prep:  1- No ejaculation 48 hours prior to exam  2- No caffeine or carbonated beverages on day of the exam  3- Eat light diet evening prior and day of exam  4- Avoid eating 4 hours prior to exam  5- Fleets enema needs to be done 4 hours prior to exam -See below. Can be purchased at the drug store.       Cystoscopy Cystoscopy is a procedure that is used to help diagnose and sometimes treat conditions that affect the lower urinary tract. The lower urinary tract includes the bladder and the urethra. The urethra is the tube that drains urine from the bladder. Cystoscopy is done using a thin, tube-shaped instrument with a light and camera at the end (cystoscope). The cystoscope may be hard or flexible, depending on the goal of the procedure. The cystoscope is inserted through the urethra, into the bladder. Cystoscopy may be recommended if you have: Urinary tract infections that keep coming back. Blood in the urine (hematuria). An inability to control when you urinate (urinary incontinence) or an overactive bladder. Unusual cells found in a urine sample. A blockage in the urethra, such as a urinary stone. Painful urination. An abnormality in the bladder found during an intravenous pyelogram (IVP) or CT scan. What are the risks? Generally, this is a safe procedure. However, problems may occur, including: Infection. Bleeding.  What happens during the procedure?  You will be given one or more of the following: A medicine to numb the area (local anesthetic). The area around the opening of your urethra will be cleaned. The cystoscope will be passed through your urethra into your bladder. Germ-free (sterile) fluid will flow through the cystoscope to fill your bladder. The fluid will stretch your bladder so that your health care provider can clearly examine your bladder walls. Your doctor will look at the urethra and bladder. The cystoscope will be removed The procedure may  vary among health care providers  What can I expect after the procedure? After the procedure, it is common to have: Some soreness or pain in your urethra. Urinary symptoms. These include: Mild pain or burning when you urinate. Pain should stop within a few minutes after you urinate. This may last for up to a few days after the procedure. A small amount of blood in your urine for several days. Feeling like you need to urinate but producing only a small amount of urine. Follow these instructions at home: General instructions Return to your normal activities as told by your health care provider.  Drink plenty of fluids after the procedure. Keep all follow-up visits as told by your health care provider. This is important. Contact a health care provider if you: Have pain that gets worse or does not get better with medicine, especially pain when you urinate lasting longer than 72 hours after the procedure. Have trouble urinating. Get help right away if you: Have blood clots in your urine. Have a fever or chills. Are unable to urinate. Summary Cystoscopy is a procedure that is used to help diagnose and sometimes treat conditions that affect the lower urinary tract. Cystoscopy is done using a thin, tube-shaped instrument with a light and camera at the end. After the procedure, it is common to have some soreness or pain in your urethra. It is normal to have blood in your urine after the procedure.  If you were prescribed an antibiotic medicine, take it as told by your health care provider.  This information is  not intended to replace advice given to you by your health care provider. Make sure you discuss any questions you have with your health care provider. Document Revised: 09/16/2018 Document Reviewed: 09/16/2018 Elsevier Patient Education  2020 [employer].

## 2023-10-23 NOTE — Progress Notes (Signed)
Marcelle Overlie Plume,acting as a scribe for Vanna Scotland, MD.,have documented all relevant documentation on the behalf of Vanna Scotland, MD,as directed by  Vanna Scotland, MD while in the presence of Vanna Scotland, MD.  10/23/23 10:08 AM   Alex Huffman 1956-08-21 [id number]  Referring provider: Excell Seltzer, MD [address] [place],  [place] [address]  Chief Complaint  Patient presents with   Establish Care   Benign Prostatic Hypertrophy    HPI: 68 year-old male who presents today to reestablish care.   He was formerly followed by Dr. Lonna Cobb, last seen in 2019. He has a personal history of chronically elevated PSA and his PSA remains elevated today. He was formerly a patient of Dr. Achilles Dunk as well.   When he was seen by Dr. Lonna Cobb in 2019, he  noted worsening lower urinary tract symptoms including urinary hesitancy, intermittent urinary stream, straining to urinate and urinary frequency.  He denied dysuria or gross hematuria.  He denied flank, abdominal, pelvic or scrotal pain.   His most recent PSA in 05/08/2023 was 6.51.   His last cross-sectional imaging was in 09/2022, which showed some mild prostatomegaly and dystrophic calcifications in the prostate.   Today, he reports severe LUTS that includes urgency frequency, incomplete bladder emptying, and weak stream.   Results for orders placed or performed in visit on 10/23/23  Bladder Scan (Post Void Residual) in office  Result Value Ref Range   Scan Result 109 ml     IPSS     Row Name 10/23/23 0800         International Prostate Symptom Score   How often have you had the sensation of not emptying your bladder? Almost always     How often have you had to urinate less than every two hours? Almost always     How often have you found you stopped and started again several times when you urinated? More than half the time     How often have you found it difficult to postpone urination? Almost always     How often  have you had a weak urinary stream? Almost always     How often have you had to strain to start urination? About half the time     How many times did you typically get up at night to urinate? 1 Time     Total IPSS Score 28       Quality of Life due to urinary symptoms   If you were to spend the rest of your life with your urinary condition just the way it is now how would you feel about that? Mostly Disatisfied              Score:  1-7 Mild 8-19 Moderate 20-35 Severe    PSA  Latest Ref Rng < OR = 4.00 ng/mL  08/31/2019 4.97 (H)   10/31/2021 4.95 (H)   12/14/2022 5.57 (H)   05/08/2023 6.51 (H)    Legend: (H) High PMH: Past Medical History:  Diagnosis Date   Anxiety    Atrial fibrillation (HCC)    Cataract    Chronic headaches    Helicobacter pylori gastritis 2009   EGD   Hyperlipemia    Personal history of colonic polyps     Surgical History: Past Surgical History:  Procedure Laterality Date   CARDIOVERSION N/A 11/09/2020   Procedure: CARDIOVERSION;  Surgeon: Sande Rives, MD;  Location: [facility] ENDOSCOPY;  Service: Cardiovascular;  Laterality:  N/A;   COLONOSCOPY  , 06/08/2011   2009: two 3 mm hyperplastic polyps, lipoma, internal hemorrjhoids 2012: 4mm cecal serrated adenoma, lipoma, internal hemorrhoids   HERNIA REPAIR  05/04   ROTATOR CUFF REPAIR  05/06   UPPER GASTROINTESTINAL ENDOSCOPY  02/02/2008   erosive gastropathy    Home Medications:  Allergies as of 10/23/2023       Reactions   Typhoid Vaccine, Live Other (See Comments), Anaphylaxis   Typhoid Vi Polysaccharide Vaccine Anaphylaxis, Other (See Comments)   Comatose, almost died   Amlodipine Other (See Comments)   Leg pain and fatigue   Atorvastatin Diarrhea   Body ache   Doxycycline Other (See Comments)   Unknown reaction   Nitrofurantoin Other (See Comments), Nausea And Vomiting   Sulfa Antibiotics Hives        Medication List        Accurate as of October 23, 2023 10:08 AM. If you  have any questions, ask your nurse or doctor.          STOP taking these medications    azithromycin 250 MG tablet Commonly known as: Zithromax Z-Pak Stopped by: Vanna Scotland   diltiazem 30 MG tablet Commonly known as: CARDIZEM Stopped by: Vanna Scotland       TAKE these medications    apixaban 5 MG Tabs tablet Commonly known as: ELIQUIS Take 1 tablet (5 mg total) by mouth 2 (two) times daily.   levothyroxine 25 MCG tablet Commonly known as: SYNTHROID Take 1 tablet by mouth once daily        Allergies:  Allergies  Allergen Reactions   Typhoid Vaccine, Live Other (See Comments) and Anaphylaxis   Typhoid Vi Polysaccharide Vaccine Anaphylaxis and Other (See Comments)    Comatose, almost died    Amlodipine Other (See Comments)    Leg pain and fatigue   Atorvastatin Diarrhea    Body ache   Doxycycline Other (See Comments)    Unknown reaction   Nitrofurantoin Other (See Comments) and Nausea And Vomiting   Sulfa Antibiotics Hives    Family History: Family History  Problem Relation Age of Onset   Heart disease Father    Celiac disease Mother    Colon cancer Neg Hx    Esophageal cancer Neg Hx    Liver cancer Neg Hx    Pancreatic cancer Neg Hx    Rectal cancer Neg Hx    Stomach cancer Neg Hx    Allergic rhinitis Neg Hx     Social History:  reports that he has never smoked. He has never used smokeless tobacco. He reports that he does not currently use alcohol. He reports that he does not use drugs.   Physical Exam: BP (!) 180/105   Pulse 60   Ht 5\' 7"  (1.702 m)   Wt 176 lb 2 oz (79.9 kg)   BMI 27.59 kg/m   Constitutional:  Alert and oriented, No acute distress. HEENT: [place] AT, moist mucus membranes.  Trachea midline, no masses. Rectal: Exam was declined by patient Neurologic: Grossly intact, no focal deficits, moving all 4 extremities. Psychiatric: Normal mood and affect.   Assessment & Plan:    1. BPH with urinary outlet obstruction -  Chronic - He has tried medications which were not tolerated including Flomax - DRE was declined  - Cystoscopy in approximately six weeks to assess bladder health and prostate anatomy, which will guide procedural options. - Discussed non-pharmacological options, including lifestyle modifications and the potential use of saw palmetto,  though evidence is variable. - He may be interested in an outlet procedure and we discussed procedural options, including UroLift, pending MRI and cystoscopy findings. - We discussed the concept and importance of bladder health   2. Elevated PSA - Order a repeat PSA test today.  - Schedule a prostate MRI to assess prostate volume and PSA density, which will help in evaluating the risk of prostate cancer.  - Consider a biopsy if MRI findings are suspicious.  Return in about 6 weeks (around 12/04/2023) for cystoscopy and review of MRI and PSA results.  I have reviewed the above documentation for accuracy and completeness, and I agree with the above.   Vanna Scotland, MD   [facility] [address], [address] [place], [place] [address] [phone]  I spent 47 total minutes on the day of the encounter including pre-visit review of the medical record, face-to-face time with the patient, and post visit ordering of labs/imaging/tests.

## 2023-10-24 ENCOUNTER — Encounter: Payer: Self-pay | Admitting: Urology

## 2023-10-24 LAB — PSA: Prostate Specific Ag, Serum: 7.3 ng/mL — ABNORMAL HIGH (ref 0.0–4.0)

## 2023-11-04 ENCOUNTER — Ambulatory Visit (INDEPENDENT_AMBULATORY_CARE_PROVIDER_SITE_OTHER): Payer: Medicare Other

## 2023-11-04 VITALS — Ht 67.0 in | Wt 170.0 lb

## 2023-11-04 DIAGNOSIS — Z0000 Encounter for general adult medical examination without abnormal findings: Secondary | ICD-10-CM

## 2023-11-04 NOTE — Patient Instructions (Addendum)
Alex Huffman , Thank you for taking time to come for your Medicare Wellness Visit. I appreciate your ongoing commitment to your health goals. Please review the following plan we discussed and let me know if I can assist you in the future.   Referrals/Orders/Follow-Ups/Clinician Recommendations:   This is a list of the screening recommended for you and due dates:  Health Maintenance  Topic Date Due   Flu Shot  01/06/2024*   Medicare Annual Wellness Visit  11/03/2024   Colon Cancer Screening  01/18/2028   DTaP/Tdap/Td vaccine (2 - Td or Tdap) 04/02/2032   Pneumonia Vaccine  Completed   Hepatitis C Screening  Completed   Zoster (Shingles) Vaccine  Completed   HPV Vaccine  Aged Out   COVID-19 Vaccine  Discontinued  *Topic was postponed. The date shown is not the original due date.    Advanced directives: (Copy Requested) Please bring a copy of your health care power of attorney and living will to the office to be added to your chart at your convenience.  Next Medicare Annual Wellness Visit scheduled for next year: Yes 11/04/2024 @ 3pm televisit

## 2023-11-04 NOTE — Progress Notes (Signed)
Subjective:   Alex Huffman is a 68 y.o. male who presents for Medicare Annual/Subsequent preventive examination.  Visit Complete: Virtual I connected with  [name] on 11/04/23 by a audio enabled telemedicine application and verified that I am speaking with the correct person using two identifiers.  Patient Location: Home  Provider Location: Home Office  I discussed the limitations of evaluation and management by telemedicine. The patient expressed understanding and agreed to proceed.  Vital Signs: Because this visit was a virtual/telehealth visit, some criteria may be missing or patient reported. Any vitals not documented were not able to be obtained and vitals that have been documented are patient reported.  Patient Medicare AWV questionnaire was completed by the patient on (not done); I have confirmed that all information answered by patient is correct and no changes since this date.  Cardiac Risk Factors include: advanced age (>4men, >73 women);hypertension;male gender    Objective:    Today's Vitals   11/04/23 1513 11/04/23 1514  Weight: 170 lb (77.1 kg)   Height: 5\' 7"  (1.702 m)   PainSc: 4  4    Body mass index is 26.63 kg/m.     11/04/2023    3:32 PM 11/01/2022    3:17 PM 04/02/2022   12:22 PM 03/29/2022   12:43 PM 11/09/2020    9:15 AM 10/19/2020    9:46 PM 10/19/2020    9:37 PM  Advanced Directives  Does Patient Have a Medical Advance Directive? Yes No No No Yes  Yes  Type of Estate agent of [place];Living will     Healthcare Power of [employer] Power of Attorney  Copy of Healthcare Power of Attorney in Chart? No - copy requested     No - copy requested   Would patient like information on creating a medical advance directive?  No - Patient declined  No - Patient declined       Current Medications (verified) Outpatient Encounter Medications as of 11/04/2023  Medication Sig   apixaban (ELIQUIS) 5 MG TABS tablet Take 1 tablet  (5 mg total) by mouth 2 (two) times daily.   levothyroxine (SYNTHROID) 25 MCG tablet Take 1 tablet by mouth once daily   No facility-administered encounter medications on file as of 11/04/2023.    Allergies (verified) Typhoid vaccine, live; Typhoid vi polysaccharide vaccine; Amlodipine; Atorvastatin; Doxycycline; Nitrofurantoin; and Sulfa antibiotics   History: Past Medical History:  Diagnosis Date   Anxiety    Atrial fibrillation (HCC)    Cataract    Chronic headaches    Helicobacter pylori gastritis 2009   EGD   Hyperlipemia    Personal history of colonic polyps    Past Surgical History:  Procedure Laterality Date   CARDIOVERSION N/A 11/09/2020   Procedure: CARDIOVERSION;  Surgeon: Sande Rives, MD;  Location: [facility] ENDOSCOPY;  Service: Cardiovascular;  Laterality: N/A;   COLONOSCOPY  , 06/08/2011   2009: two 3 mm hyperplastic polyps, lipoma, internal hemorrjhoids 2012: 4mm cecal serrated adenoma, lipoma, internal hemorrhoids   HERNIA REPAIR  05/04   ROTATOR CUFF REPAIR  05/06   UPPER GASTROINTESTINAL ENDOSCOPY  02/02/2008   erosive gastropathy   Family History  Problem Relation Age of Onset   Heart disease Father    Celiac disease Mother    Colon cancer Neg Hx    Esophageal cancer Neg Hx    Liver cancer Neg Hx    Pancreatic cancer Neg Hx    Rectal cancer Neg Hx  Stomach cancer Neg Hx    Allergic rhinitis Neg Hx    Social History   Socioeconomic History   Marital status: Married    Spouse name: Not on file   Number of children: 3   Years of education: Not on file   Highest education level: Not on file  Occupational History   Occupation: Retired  Tobacco Use   Smoking status: Never   Smokeless tobacco: Never  Vaping Use   Vaping status: Never Used  Substance and Sexual Activity   Alcohol use: Not Currently    Comment: Occasional beer   Drug use: No   Sexual activity: Yes    Partners: Female    Birth control/protection: None  Other Topics Concern    Not on file  Social History Narrative   Not on file   Social Drivers of Health   Financial Resource Strain: Low Risk  (11/04/2023)   Overall Financial Resource Strain (CARDIA)    Difficulty of Paying Living Expenses: Not hard at all  Food Insecurity: No Food Insecurity (11/04/2023)   Hunger Vital Sign    Worried About Running Out of Food in the Last Year: Never true    Ran Out of Food in the Last Year: Never true  Transportation Needs: No Transportation Needs (11/04/2023)   PRAPARE - Administrator, Civil Service (Medical): No    Lack of Transportation (Non-Medical): No  Physical Activity: Insufficiently Active (11/04/2023)   Exercise Vital Sign    Days of Exercise per Week: 3 days    Minutes of Exercise per Session: 40 min  Stress: No Stress Concern Present (11/04/2023)   [employer] of Occupational Health - Occupational Stress Questionnaire    Feeling of Stress : Not at all  Social Connections: Moderately Integrated (11/04/2023)   Social Connection and Isolation Panel [NHANES]    Frequency of Communication with Friends and Family: More than three times a week    Frequency of Social Gatherings with Friends and Family: More than three times a week    Attends Religious Services: 1 to 4 times per year    Active Member of [employer] or Organizations: No    Attends Engineer, structural: Never    Marital Status: Married    Tobacco Counseling Counseling given: Not Answered  Clinical Intake:  Pre-visit preparation completed: Yes  Pain : 0-10 Pain Score: 4  Pain Location: Back Pain Orientation: Lower Pain Descriptors / Indicators: Aching Pain Onset: More than a month ago Pain Frequency: Constant Pain Relieving Factors: tylenol, tens unit  Pain Relieving Factors: tylenol, tens unit  BMI - recorded: 26.63 Nutritional Status: BMI 25 -29 Overweight Nutritional Risks: None Diabetes: No  How often do you need to have someone help you when you read  instructions, pamphlets, or other written materials from your doctor or pharmacy?: 1 - Never  Interpreter Needed?: No  Comments: lives with wife Information entered by :: B.Jahfari Ambers,LPN  Activities of Daily Living    11/04/2023    3:36 PM  In your present state of health, do you have any difficulty performing the following activities:  Hearing? 0  Vision? 1  Difficulty concentrating or making decisions? 0  Walking or climbing stairs? 0  Dressing or bathing? 0  Doing errands, shopping? 0  Preparing Food and eating ? N  Using the Toilet? N  In the past six months, have you accidently leaked urine? N  Do you have problems with loss of bowel control? N  Managing  your Medications? N  Managing your Finances? N  Housekeeping or managing your Housekeeping? N    Patient Care Team: Excell Seltzer, MD as PCP - General (Family Medicine) Nahser, Deloris Ping, MD as PCP - Cardiology (Cardiology) [facility], [place]  Indicate any recent Medical Services you may have received from other than [facility] providers in the past year (date may be approximate).     Assessment:   This is a routine wellness examination for [facility].  Hearing/Vision screen Hearing Screening - Comments:: Pt says his hearing is well Vision Screening - Comments:: Pt says his vision has deteriorated over the last 3 months due cataract [facility]-11/13/2023 eye appt   Goals Addressed             This Visit's Progress    DIET - EAT MORE FRUITS AND VEGETABLES   On track    Patient Stated   On track    10/11/2020, I will maintain and continue medications as prescribed.        Depression Screen    11/04/2023    3:28 PM 11/01/2022    3:12 PM 03/29/2022   10:53 AM 10/31/2021   10:51 AM 10/11/2020    3:52 PM 08/31/2019    8:33 AM 06/02/2018   11:21 AM  PHQ 2/9 Scores  PHQ - 2 Score 0 0 0 0 0 0 0  PHQ- 9 Score  0   0      Fall Risk    11/04/2023    3:18 PM 04/03/2023    9:10 AM 11/01/2022    3:19 PM 03/29/2022    10:53 AM 10/31/2021   10:51 AM  Fall Risk   Falls in the past year? 0 0 0 0 0  Number falls in past yr: 0 0 0    Injury with Fall? 0 0 0    Risk for fall due to : No Fall Risks No Fall Risks No Fall Risks    Follow up Education provided;Falls prevention discussed Falls evaluation completed Falls prevention discussed;Falls evaluation completed Falls evaluation completed     MEDICARE RISK AT HOME: Medicare Risk at Home Any stairs in or around the home?: Yes If so, are there any without handrails?: Yes Home free of loose throw rugs in walkways, pet beds, electrical cords, etc?: Yes Adequate lighting in your home to reduce risk of falls?: Yes Life alert?: No Use of a cane, walker or w/c?: No Grab bars in the bathroom?: No Shower chair or bench in shower?: No Elevated toilet seat or a handicapped toilet?: No  TIMED UP AND GO:  Was the test performed?  No    Cognitive Function:    10/11/2020    3:53 PM  MMSE - Mini Mental State Exam  Not completed: Refused        11/04/2023    3:44 PM 11/01/2022    3:23 PM  6CIT Screen  What Year? 0 points 0 points  What month? 0 points 0 points  What time? 0 points 0 points  Count back from 20 0 points 0 points  Months in reverse 0 points 0 points  Repeat phrase 0 points 0 points  Total Score 0 points 0 points    Immunizations Immunization History  Administered Date(s) Administered   Influenza Whole 07/14/2008   Influenza, High Dose Seasonal PF 05/28/2019   Influenza, Quadrivalent, Recombinant, Inj, Pf 07/24/2018   Influenza,inj,Quad PF,6+ Mos 09/16/2015, 07/08/2017   Influenza,inj,quad, With Preservative 09/16/2015   Influenza-Unspecified 08/01/2016,  05/28/2019   Janssen (J&J) SARS-COV-2 Vaccination 12/22/2019   Pneumococcal Conjugate-13 06/02/2019   Pneumococcal Polysaccharide-23 07/12/2020   Tdap 04/02/2022   Zoster Recombinant(Shingrix) 12/02/2017, 07/29/2018    TDAP status: Up to date  Flu Vaccine status: Due, Education  has been provided regarding the importance of this vaccine. Advised may receive this vaccine at local pharmacy or Health Dept. Aware to provide a copy of the vaccination record if obtained from local pharmacy or Health Dept. Verbalized acceptance and understanding.  Pneumococcal vaccine status: Up to date  Covid-19 vaccine status: Completed vaccines  Qualifies for Shingles Vaccine? Yes   Zostavax completed Yes   Shingrix Completed?: Yes  Screening Tests Health Maintenance  Topic Date Due   INFLUENZA VACCINE  01/06/2024 (Originally 05/09/2023)   Medicare Annual Wellness (AWV)  11/03/2024   Colonoscopy  01/18/2028   DTaP/Tdap/Td (2 - Td or Tdap) 04/02/2032   Pneumonia Vaccine 56+ Years old  Completed   Hepatitis C Screening  Completed   Zoster Vaccines- Shingrix  Completed   HPV VACCINES  Aged Out   COVID-19 Vaccine  Discontinued    Health Maintenance  There are no preventive care reminders to display for this patient.   Colorectal cancer screening: Type of screening: Colonoscopy. Completed 01/17/2018. Repeat every 10 years  Lung Cancer Screening: (Low Dose CT Chest recommended if Age 68-80 years, 20 pack-year currently smoking OR have quit w/in 15years.) does not qualify.   Lung Cancer Screening Referral: no  Additional Screening:  Hepatitis C Screening: does not qualify; Completed 09/28/2022  Vision Screening: Recommended annual ophthalmology exams for early detection of glaucoma and other disorders of the eye. Is the patient up to date with their annual eye exam?  Yes  Who is the provider or what is the name of the office in which the patient attends annual eye exams? [facility] If pt is not established with a provider, would they like to be referred to a provider to establish care? No .   Dental Screening: Recommended annual dental exams for proper oral hygiene  Diabetic Foot Exam: n/a  Community Resource Referral / Chronic Care Management: CRR required this  visit?  No   CCM required this visit?  No    Plan:     I have personally reviewed and noted the following in the patient's chart:   Medical and social history Use of alcohol, tobacco or illicit drugs  Current medications and supplements including opioid prescriptions. Patient is not currently taking opioid prescriptions. Functional ability and status Nutritional status Physical activity Advanced directives List of other physicians Hospitalizations, surgeries, and ER visits in previous 12 months Vitals Screenings to include cognitive, depression, and falls Referrals and appointments  In addition, I have reviewed and discussed with patient certain preventive protocols, quality metrics, and best practice recommendations. A written personalized care plan for preventive services as well as general preventive health recommendations were provided to patient.     Sue Lush, LPN   1/61/0960   After Visit Summary: (MyChart) Due to this being a telephonic visit, the after visit summary with patients personalized plan was offered to patient via MyChart   Nurse Notes: Pt indicates he has a series of test (urology/cardiac) upcoming. He is aware of PE due this month. He relays he will schedule PE after tests/appts around March. He has no other concerns or questions at this time.

## 2023-11-14 ENCOUNTER — Ambulatory Visit
Admission: RE | Admit: 2023-11-14 | Discharge: 2023-11-14 | Disposition: A | Discharge status: Home / Self Care | Payer: Medicare Other | Source: Ambulatory Visit | Attending: Urology | Admitting: Urology

## 2023-11-14 DIAGNOSIS — N401 Enlarged prostate with lower urinary tract symptoms: Secondary | ICD-10-CM | POA: Diagnosis present

## 2023-11-14 DIAGNOSIS — R972 Elevated prostate specific antigen [PSA]: Secondary | ICD-10-CM | POA: Insufficient documentation

## 2023-11-14 MED ORDER — GADOBUTROL 1 MMOL/ML IV SOLN
7.0000 mL | Freq: Once | INTRAVENOUS | Status: AC | PRN
  Administered 2023-11-14: 7 mL via INTRAVENOUS

## 2023-11-15 ENCOUNTER — Encounter: Payer: Self-pay | Admitting: Urology

## 2023-11-21 ENCOUNTER — Telehealth: Payer: Self-pay | Admitting: *Deleted

## 2023-11-21 ENCOUNTER — Other Ambulatory Visit: Payer: Self-pay | Admitting: Family Medicine

## 2023-11-21 DIAGNOSIS — E039 Hypothyroidism, unspecified: Secondary | ICD-10-CM

## 2023-11-21 NOTE — Telephone Encounter (Signed)
LVM for patient to c/b and schedule.

## 2023-11-21 NOTE — Telephone Encounter (Signed)
Copied from CRM [id number]. Topic: Clinical - Medication Question >> Nov 21, 2023  9:48 AM Alex Huffman wrote: Reason for CRM: Patient did not want to schedule his physical at moment, states he will call back at a later date to schedule, he would like diet and mentally prepare himself prior to scheduling the appointment. Also states he has other appointments scheduled.

## 2023-11-21 NOTE — Telephone Encounter (Signed)
Please call and schedule CPE with fasting labs prior with Dr. Ermalene Searing.

## 2023-11-21 NOTE — Telephone Encounter (Signed)
Front office reached out to patient to schedule his CPE.  Patient called back and this was his response.  FYI to Dr. Ermalene Searing.

## 2023-11-27 ENCOUNTER — Other Ambulatory Visit: Payer: Self-pay | Admitting: Urology

## 2023-11-28 ENCOUNTER — Telehealth: Payer: Medicare Other | Admitting: Urology

## 2023-11-28 DIAGNOSIS — R972 Elevated prostate specific antigen [PSA]: Secondary | ICD-10-CM | POA: Diagnosis not present

## 2023-11-28 DIAGNOSIS — N401 Enlarged prostate with lower urinary tract symptoms: Secondary | ICD-10-CM

## 2023-11-28 NOTE — Progress Notes (Signed)
 Virtual Visit via Video Note  I connected with Alex Huffman on 11/28/23 at 11:45 AM EST by a video enabled telemedicine application and verified that I am speaking with the correct person using two identifiers.  Location: Patient: Home Provider: Office   I discussed the limitations of evaluation and management by telemedicine and the availability of in person appointments. The patient expressed understanding and agreed to proceed.  History of Present Illness:  68 year-old male who was last seen in January with a PSA of 6.51 and worsening lower urinary tract symptoms and elevated PVR. He declined his rectal exam at that time. We discussed proceeding with cystoscopy to evaluate his bladder, and also an MRI to evaluate his elevated PSA.   His MRI appears fairly equivocal. There is a PI-RADS 3 lesion at the right posterior lateral margins. He would like to discuss this further. We have had some conversation regarding this via MyChart.    He reports worsening lower urinary tract symptoms including difficulty urinating, especially in the morning, and occasional burning sensation. He has tried medications for urinary symptoms but did not tolerate them well.  Pertinent Imaging: EXAM: MR PROSTATE WITHOUT AND WITH CONTRAST   TECHNIQUE: Multiplanar multisequence MRI images were obtained of the pelvis centered about the prostate. Pre and post contrast images were obtained.   CONTRAST:  7mL GADAVIST GADOBUTROL 1 MMOL/ML IV SOLN   COMPARISON:  CT pelvis 09/28/2022   FINDINGS: Prostate: Encapsulated nodularity in the transition zone compatible with benign prostatic hypertrophy.   Hazy low T2 signal in the peripheral zone is nonfocal, likely postinflammatory, and is considered PI-RADS category 2.   Region of interest # 1: PI-RADS category 3 lesion of the right posterolateral peripheral zone in the mid gland with focally reduced T2 signal (image 50 series 9) corresponding to mild focally  reduced ADC map activity (image 17 series 7). This measures 0.37 cc (1.4 by 0.6 by 0.7 cm).   The bandlike reduced T2 signal in the left posterolateral peripheral zone in the midgland is not accompanied by significant reduction in ADC map activity and accordingly is considered PI-RADS category 2.   Volume: 3D volumetric analysis: Prostate volume 80.0 cc (6.3 by 5.4 by 5.9 cm).   Transcapsular spread: Absent   Seminal vesicle involvement: Absent   Neurovascular bundle involvement: Absent   Pelvic adenopathy: Absent   Bone metastasis: Absent   Other findings: No other significant findings.   IMPRESSION: 1. PI-RADS category 3 lesion of the right posterolateral peripheral zone in the mid gland. Targeting data sent to [facility]. 2. Prostatomegaly and benign prostatic hypertrophy.     Electronically Signed   By: Gaylyn Rong M.D.   On: 11/15/2023 15:16  Observations/Objective:  Patient appears well  Assessment and Plan:  1. Elevated PSA - Differential Diagnosis: Benign prostatic hyperplasia (BPH), prostate cancer, prostatitis. - Discussed the low likelihood of significant prostate cancer with the current PIRADS-3 lesion.  - Recommended monitoring PSA levels and considering biopsy if urinary symptoms necessitate surgical intervention.  - If patient opts for surgery, biopsy should be performed to rule out cancer before proceeding as this will ultimately change the management.   2. BPH with LUTS - Discussed surgical options including cystoscopy to evaluate prostate anatomy and determine appropriate intervention (e.g., TURP, laser therapy, Urolift).  - Cystoscopy scheduled for March, with potential for concurrent biopsy if indicated.  - Discussed the importance of addressing symptoms to prevent bladder decompensation. - Advised against starting finasteride due to the long  onset of action and previous intolerance to medications like Flomax. - We discussed prostate biopsy in  detail including the procedure itself, the risks of blood in the urine, stool, and ejaculate, serious infection, and discomfort.   Follow Up Instructions:   Return for cystoscopy in March followed by fusion biopsy later in the month.  I discussed the assessment and treatment plan with the patient. The patient was provided an opportunity to ask questions and all were answered. The patient agreed with the plan and demonstrated an understanding of the instructions.   The patient was advised to call back or seek an in-person evaluation if the symptoms worsen or if the condition fails to improve as anticipated.  I provided 25 minutes of non-face-to-face time during this encounter.  An additional 7 minutes were provided with chart review, image review, documentation and care coordination follow-up.

## 2023-12-12 ENCOUNTER — Telehealth: Payer: Self-pay

## 2023-12-12 NOTE — Telephone Encounter (Signed)
 Patient called today and rescheduled his Cysto that was originally scheduled for March to April, patient was scheduled for fusion prostate biopsy on 01/03/24 tentatively. Dr Apolinar Junes, do I need to reschedule biopsy to a later date at this time after Cystoscopy appointment?

## 2023-12-18 NOTE — Telephone Encounter (Signed)
 Lets just focus on ruling out prostate cancer for now and then we can assess the need for cystoscopy down the road.  Vanna Scotland, MD

## 2023-12-19 ENCOUNTER — Encounter: Payer: Self-pay | Admitting: Family Medicine

## 2023-12-19 ENCOUNTER — Ambulatory Visit: Payer: Self-pay | Admitting: Family Medicine

## 2023-12-19 ENCOUNTER — Ambulatory Visit (INDEPENDENT_AMBULATORY_CARE_PROVIDER_SITE_OTHER): Admitting: Family Medicine

## 2023-12-19 VITALS — BP 130/78 | HR 63 | Temp 98.0°F | Ht 67.0 in | Wt 175.5 lb

## 2023-12-19 DIAGNOSIS — J0100 Acute maxillary sinusitis, unspecified: Secondary | ICD-10-CM

## 2023-12-19 MED ORDER — GUAIFENESIN-CODEINE 100-10 MG/5ML PO SOLN
5.0000 mL | Freq: Every evening | ORAL | 0 refills | Status: DC | PRN
Start: 2023-12-19 — End: 2024-01-23

## 2023-12-19 MED ORDER — AMOXICILLIN 500 MG PO CAPS: 1000.0000 mg | ORAL_CAPSULE | Freq: Two times a day (BID) | ORAL | 0 refills | Status: DC

## 2023-12-19 NOTE — Telephone Encounter (Signed)
 Copied from CRM [id number]. Topic: Clinical - Red Word Triage >> Dec 19, 2023  8:25 AM [place] A wrote: [facility] that prompted transfer to Nurse Triage: Fever 99.7-101,very sore throat,right ear has pain,teeth hurt as well, congestion in nasal passage- has been like this for two weeks  Chief Complaint: congestion Symptoms: nasal & chest congestion, body aches, fever, right ear ache, headache, teeth hurt Frequency: x 2 weeks Pertinent Negatives: Patient denies n/v,  Disposition: [] ED /[] Urgent Care (no appt availability in office) / [x] Appointment(In office/virtual)/ []  [facility]/ [] Home Care/ [] Refused Recommended Disposition /[] [facility]/ []  Follow-up with PCP Additional Notes: pt states he does not take OTC cold medication because it is bad for him. SOB at times - states related to at times when Afib. C/o sore throat.  Pt would like ABX to help with s/s  Reason for Disposition  Fever present > 3 days (72 hours)  Answer Assessment - Initial Assessment Questions 1. LOCATION: "Where does it hurt?"      Nasal and chest congestion 2. ONSET: "When did the sinus pain start?"  (e.g., hours, days)      X 2 weeks 3. SEVERITY: "How bad is the pain?"   (Scale 1-10; mild, moderate or severe)   - MILD (1-3): doesn't interfere with normal activities    - MODERATE (4-7): interferes with normal activities (e.g., work or school) or awakens from sleep   - SEVERE (8-10): excruciating pain and patient unable to do any normal activities        moderate 4. RECURRENT SYMPTOM: "Have you ever had sinus problems before?" If Yes, ask: "When was the last time?" and "What happened that time?"      no 5. NASAL CONGESTION: "Is the nose blocked?" If Yes, ask: "Can you open it or must you breathe through your mouth?" yes 6. NASAL DISCHARGE: "Do you have discharge from your nose?" If so ask, "What color?"         Clear to green to yellowish  7. FEVER: "Do you have a fever?" If Yes, ask: "What  is it, how was it measured, and when did it start?"      99.7 - 101 8. OTHER SYMPTOMS: "Do you have any other symptoms?" (e.g., sore throat, cough, earache, difficulty breathing)     Right ear pain, headache, teeth hurt, sore throat 9. PREGNANCY: "Is there any chance you are pregnant?" "When was your last menstrual period?"     no  Protocols used: Sinus Pain or Congestion-A-AH

## 2023-12-19 NOTE — Assessment & Plan Note (Signed)
 Acute, likely initial viral infection possible flu now with likely bacterial superinfection. Encouraged patient to use nasal saline to clear nasal passages and sinuses.  Treat with amoxicillin 500 mg 2 tablets twice daily x 10 days. Consider Flonase 2 sprays per nostril daily.  Return and ER precautions provided.

## 2023-12-19 NOTE — Progress Notes (Signed)
 Patient ID: Alex Huffman, male    DOB: [birth year], 68 y.o.   MRN: [id number]  This visit was conducted in person.  BP 130/78 (BP Location: Left Arm, Patient Position: Sitting, Cuff Size: Normal)   Pulse 63   Temp 98 F (36.7 C) (Temporal)   Ht 5\' 7"  (1.702 m)   Wt 175 lb 8 oz (79.6 kg)   SpO2 98%   BMI 27.49 kg/m    CC:  Chief Complaint  Patient presents with   Facial Pain    Teeth Pain x 2 weeks      Lymphadenopathy   Cough   Headache   Ear Pain   Nasal Congestion   Fever    Last week     Subjective:   HPI: Alex Huffman is a 68 y.o. male presenting on 12/19/2023 for Facial Pain (Teeth Pain x 2 weeks //), Lymphadenopathy, Cough, Headache, Ear Pain, Nasal Congestion, and Fever (Last week/)   Recent MRi showing grade 3 prostate lesion. Upcoming cystoscopy/ prostate biopsy pending.   Date of onset:  2 weeks Initial symptoms included  ST,  right cervical node swelling,  headache, ear pain, nasal congestion, fever 101.1 F last week Symptoms progressed to productive cough, facial pain and teeth pain r.L  Copius nasal discharge.  Couldn't sleep at night from pain in sinus, cough keeping up at night. Some  SOB, no wheeze.   Sick contacts:  grandkids COVID testing:   none     He has tried to treated with  tylenol    No history of chronic lung disease such as asthma or COPD. Non-smoker.   No recent antibiotics. Last in  azithromycin 09/2024      Relevant past medical, surgical, family and social history reviewed and updated as indicated. Interim medical history since our last visit reviewed. Allergies and medications reviewed and updated. Outpatient Medications Prior to Visit  Medication Sig Dispense Refill   apixaban (ELIQUIS) 5 MG TABS tablet Take 1 tablet (5 mg total) by mouth 2 (two) times daily. 60 tablet 5   levothyroxine (SYNTHROID) 25 MCG tablet Take 1 tablet by mouth once daily 90 tablet 0   No facility-administered medications prior to visit.      Per HPI unless specifically indicated in ROS section below Review of Systems  Constitutional:  Negative for fatigue and fever.  HENT:  Positive for congestion, ear pain and sore throat.   Eyes:  Negative for pain.  Respiratory:  Negative for cough and shortness of breath.   Cardiovascular:  Negative for chest pain, palpitations and leg swelling.  Gastrointestinal:  Negative for abdominal pain.  Genitourinary:  Negative for dysuria.  Musculoskeletal:  Negative for arthralgias.  Neurological:  Positive for headaches. Negative for syncope and light-headedness.  Psychiatric/Behavioral:  Negative for dysphoric mood.    Objective:  BP 130/78 (BP Location: Left Arm, Patient Position: Sitting, Cuff Size: Normal)   Pulse 63   Temp 98 F (36.7 C) (Temporal)   Ht 5\' 7"  (1.702 m)   Wt 175 lb 8 oz (79.6 kg)   SpO2 98%   BMI 27.49 kg/m   Wt Readings from Last 3 Encounters:  12/19/23 175 lb 8 oz (79.6 kg)  11/04/23 170 lb (77.1 kg)  10/23/23 176 lb 2 oz (79.9 kg)      Physical Exam Constitutional:      General: He is not in acute distress.    Appearance: Normal appearance. He is well-developed. He is not ill-appearing  or toxic-appearing.  HENT:     Head: Normocephalic and atraumatic.     Right Ear: Hearing, tympanic membrane, ear canal and external ear normal. No tenderness. No foreign body. Tympanic membrane is not retracted or bulging.     Left Ear: Hearing, tympanic membrane, ear canal and external ear normal. No tenderness. No foreign body. Tympanic membrane is not retracted or bulging.     Nose: Nose normal. No mucosal edema or rhinorrhea.     Right Sinus: No maxillary sinus tenderness or frontal sinus tenderness.     Left Sinus: No maxillary sinus tenderness or frontal sinus tenderness.     Mouth/Throat:     Dentition: Normal dentition. No dental caries.     Pharynx: Uvula midline. No oropharyngeal exudate.     Tonsils: No tonsillar abscesses.  Eyes:     General: Lids are  normal. Lids are everted, no foreign bodies appreciated.     Conjunctiva/sclera: Conjunctivae normal.     Pupils: Pupils are equal, round, and reactive to light.  Neck:     Thyroid: No thyroid mass or thyromegaly.     Vascular: No carotid bruit.     Trachea: Trachea and phonation normal.  Cardiovascular:     Rate and Rhythm: Normal rate and regular rhythm.     Pulses: Normal pulses.     Heart sounds: Normal heart sounds, S1 normal and S2 normal. No murmur heard.    No gallop.  Pulmonary:     Effort: Pulmonary effort is normal. No respiratory distress.     Breath sounds: Normal breath sounds. No wheezing, rhonchi or rales.  Abdominal:     General: Bowel sounds are normal.     Palpations: Abdomen is soft.     Tenderness: There is no abdominal tenderness. There is no guarding or rebound.     Hernia: No hernia is present.  Musculoskeletal:     Cervical back: Normal range of motion and neck supple.  Lymphadenopathy:     Cervical: Cervical adenopathy present.     Right cervical: Superficial cervical adenopathy present. No deep or posterior cervical adenopathy.    Left cervical: Superficial cervical adenopathy present. No deep or posterior cervical adenopathy.  Skin:    General: Skin is warm and dry.     Findings: No rash.  Neurological:     Mental Status: He is alert.     Deep Tendon Reflexes: Reflexes are normal and symmetric.  Psychiatric:        Speech: Speech normal.        Behavior: Behavior normal.        Judgment: Judgment normal.       Results for orders placed or performed in visit on 10/23/23  Bladder Scan (Post Void Residual) in office   Collection Time: 10/23/23  8:48 AM  Result Value Ref Range   Scan Result 109 ml   Microscopic Examination   Collection Time: 10/23/23  8:54 AM   Urine  Result Value Ref Range   WBC, UA 0-5 0 - 5 /hpf   RBC, Urine 0-2 0 - 2 /hpf   Epithelial Cells (non renal) 0-10 0 - 10 /hpf   Bacteria, UA None seen None seen/Few  Urinalysis,  Complete   Collection Time: 10/23/23  8:54 AM  Result Value Ref Range   Specific Gravity, UA 1.025 1.005 - 1.030   pH, UA 6.0 5.0 - 7.5   Color, UA Yellow Yellow   Appearance Ur Clear Clear   Leukocytes,UA Negative  Negative   Protein,UA Negative Negative/Trace   Glucose, UA Negative Negative   Ketones, UA Negative Negative   RBC, UA Negative Negative   Bilirubin, UA Negative Negative   Urobilinogen, Ur 0.2 0.2 - 1.0 mg/dL   Nitrite, UA Negative Negative   Microscopic Examination See below:   PSA   Collection Time: 10/23/23  9:56 AM  Result Value Ref Range   Prostate Specific Ag, Serum 7.3 (H) 0.0 - 4.0 ng/mL    Assessment and Plan  Acute non-recurrent maxillary sinusitis Assessment & Plan: Acute, likely initial viral infection possible flu now with likely bacterial superinfection. Encouraged patient to use nasal saline to clear nasal passages and sinuses.  Treat with amoxicillin 500 mg 2 tablets twice daily x 10 days. Consider Flonase 2 sprays per nostril daily.  Return and ER precautions provided.   Other orders -     Amoxicillin; Take 2 capsules (1,000 mg total) by mouth 2 (two) times daily.  Dispense: 40 capsule; Refill: 0 -     guaiFENesin-Codeine; Take 5-10 mLs by mouth at bedtime as needed for cough.  Dispense: 120 mL; Refill: 0    No follow-ups on file.   Kerby Nora, MD

## 2023-12-19 NOTE — Telephone Encounter (Signed)
 Left message to discuss. For Biopsy will need to hold Eliquis 5 to 7 days prior to his appointment on 01/03/24 per clearance.

## 2023-12-20 ENCOUNTER — Other Ambulatory Visit: Payer: Medicare Other | Admitting: Urology

## 2023-12-20 ENCOUNTER — Telehealth: Payer: Self-pay | Admitting: Family Medicine

## 2023-12-20 NOTE — Telephone Encounter (Signed)
 Left message for Mr. Mcdanel with below information from Dr. Ermalene Searing.  I ask that he call us back if he has any questions.

## 2023-12-20 NOTE — Telephone Encounter (Signed)
 Let patient know that when looking into additional cough suppressants several of them  are linked with triggering A-fib but so I would suggest he just proceed with the antibiotic and supportive care. I am not sending in a different cough suppressant.

## 2023-12-20 NOTE — Telephone Encounter (Signed)
 Received fax stating Guaif/Codeine 100-10 mg/5 ml is on backorder and to resend something else.

## 2023-12-23 NOTE — Telephone Encounter (Signed)
See mychart communication with the patient.

## 2023-12-23 NOTE — Telephone Encounter (Signed)
 Called again no answer, sent mychart message.

## 2024-01-03 ENCOUNTER — Other Ambulatory Visit: Payer: Medicare Other | Admitting: Urology

## 2024-01-09 ENCOUNTER — Ambulatory Visit (INDEPENDENT_AMBULATORY_CARE_PROVIDER_SITE_OTHER): Admitting: Physician Assistant

## 2024-01-09 VITALS — BP 164/101 | HR 58 | Temp 97.4°F

## 2024-01-09 DIAGNOSIS — R972 Elevated prostate specific antigen [PSA]: Secondary | ICD-10-CM | POA: Diagnosis not present

## 2024-01-09 DIAGNOSIS — N401 Enlarged prostate with lower urinary tract symptoms: Secondary | ICD-10-CM

## 2024-01-09 DIAGNOSIS — R300 Dysuria: Secondary | ICD-10-CM

## 2024-01-09 LAB — MICROSCOPIC EXAMINATION: Bacteria, UA: NONE SEEN

## 2024-01-09 LAB — URINALYSIS, COMPLETE
Bilirubin, UA: NEGATIVE
Glucose, UA: NEGATIVE
Ketones, UA: NEGATIVE
Leukocytes,UA: NEGATIVE
Nitrite, UA: NEGATIVE
Protein,UA: NEGATIVE
RBC, UA: NEGATIVE
Specific Gravity, UA: 1.02 (ref 1.005–1.030)
Urobilinogen, Ur: 0.2 mg/dL (ref 0.2–1.0)
pH, UA: 7 (ref 5.0–7.5)

## 2024-01-09 LAB — BLADDER SCAN AMB NON-IMAGING: Scan Result: 120

## 2024-01-09 MED ORDER — SILODOSIN 8 MG PO CAPS: 8.0000 mg | ORAL_CAPSULE | Freq: Every day | ORAL | 0 refills | Status: AC

## 2024-01-09 NOTE — Patient Instructions (Signed)
 Common foods that can worsen bladder pain, pain with urination, or urinary urgency/frequency include:  Caffeine Alcohol Carbonated beverages Chocolate Spicy foods Acidic foods including citrus and tomatoes

## 2024-01-10 NOTE — Progress Notes (Signed)
 01/09/2024 12:06 PM   Alex Huffman 06-Sep-1956 [id number]  CC: Chief Complaint  Patient presents with   Follow-up   HPI: Alex Huffman is a 68 y.o. male with PMH A-fib on Eliquis, elevated PSA with PI-RADS 3 lesion on prostate MRI scheduled for upcoming prostate biopsy, and LUTS scheduled for upcoming cystoscopy who presents today for evaluation of dysuria.   Today he reports about 10 days of dysuria, frequency, and rectal pain that radiates to the tip of his penis when he sits down.  He denies fevers or gross hematuria.  His symptoms began after he ingested about a gallon of chocolate milk within a day.  He wonders if this may have caused inflammation of his prostate.  He has been taking Tylenol, which helps.  He was previously on Flomax and Rapaflo, but describes a "comatose trance" that lasted about an hour after taking either 1 or both of these.  Due to poor tolerance of medications, he would like to avoid additional pharmacotherapy if possible but is curious what dietary changes he could make or supplements he could take to try to improve his symptoms.  He denies bowel changes, medic easier, melena, and is up-to-date on colonoscopy.  Additionally, he has several questions today about the risk of prostate biopsy and wonders if it would be reasonable to defer this in favor of PSA monitoring instead.  He questions the safety of stopping Eliquis for prostate biopsy.  He also wonders if cystoscopy is necessary.  In-office UA and microscopy today pan negative. PVR .  PMH: Past Medical History:  Diagnosis Date   Anxiety    Atrial fibrillation (HCC)    Cataract    Chronic headaches    Helicobacter pylori gastritis 2009   EGD   Hyperlipemia    Personal history of colonic polyps     Surgical History: Past Surgical History:  Procedure Laterality Date   CARDIOVERSION N/A 11/09/2020   Procedure: CARDIOVERSION;  Surgeon: Sande Rives, MD;  Location: [facility] ENDOSCOPY;   Service: Cardiovascular;  Laterality: N/A;   COLONOSCOPY  , 06/08/2011   2009: two 3 mm hyperplastic polyps, lipoma, internal hemorrjhoids 2012: 4mm cecal serrated adenoma, lipoma, internal hemorrhoids   HERNIA REPAIR  05/04   ROTATOR CUFF REPAIR  05/06   UPPER GASTROINTESTINAL ENDOSCOPY  02/02/2008   erosive gastropathy    Home Medications:  Allergies as of 01/09/2024       Reactions   Typhoid Vaccine, Live Other (See Comments), Anaphylaxis   Typhoid Vi Polysaccharide Vaccine Anaphylaxis, Other (See Comments)   Comatose, almost died   Amlodipine Other (See Comments)   Leg pain and fatigue   Atorvastatin Diarrhea   Body ache   Azithromycin Other (See Comments)   Rapid Heart Rate/Palpitations   Doxycycline Other (See Comments)   Severe Stomach Issues   Nitrofurantoin Other (See Comments), Nausea And Vomiting   Sulfa Antibiotics Hives        Medication List        Accurate as of January 09, 2024 11:59 PM. If you have any questions, ask your nurse or doctor.          amoxicillin 500 MG capsule Commonly known as: AMOXIL Take 2 capsules (1,000 mg total) by mouth 2 (two) times daily.   apixaban 5 MG Tabs tablet Commonly known as: ELIQUIS Take 1 tablet (5 mg total) by mouth 2 (two) times daily.   guaiFENesin-codeine 100-10 MG/5ML syrup Take 5-10 mLs by mouth at bedtime as needed  for cough.   levothyroxine 25 MCG tablet Commonly known as: SYNTHROID Take 1 tablet by mouth once daily   silodosin 8 MG Caps capsule Commonly known as: RAPAFLO Take 1 capsule (8 mg total) by mouth daily with breakfast for 14 days.        Allergies:  Allergies  Allergen Reactions   Typhoid Vaccine, Live Other (See Comments) and Anaphylaxis   Typhoid Vi Polysaccharide Vaccine Anaphylaxis and Other (See Comments)    Comatose, almost died    Amlodipine Other (See Comments)    Leg pain and fatigue   Atorvastatin Diarrhea    Body ache   Azithromycin Other (See Comments)    Rapid Heart  Rate/Palpitations   Doxycycline Other (See Comments)    Severe Stomach Issues   Nitrofurantoin Other (See Comments) and Nausea And Vomiting   Sulfa Antibiotics Hives    Family History: Family History  Problem Relation Age of Onset   Heart disease Father    Celiac disease Mother    Colon cancer Neg Hx    Esophageal cancer Neg Hx    Liver cancer Neg Hx    Pancreatic cancer Neg Hx    Rectal cancer Neg Hx    Stomach cancer Neg Hx    Allergic rhinitis Neg Hx     Social History:   reports that he has never smoked. He has never used smokeless tobacco. He reports that he does not currently use alcohol. He reports that he does not use drugs.  Physical Exam: BP (!) 164/101   Pulse (!) 58   Temp (!) 97.4 F (36.3 C)   Constitutional:  Alert and oriented, no acute distress, nontoxic appearing HEENT: [place], AT Cardiovascular: No clubbing, cyanosis, or edema Respiratory: Normal respiratory effort, no increased work of breathing Skin: No rashes, bruises or suspicious lesions Neurologic: Grossly intact, no focal deficits, moving all 4 extremities Psychiatric: Normal mood and affect  Laboratory Data: Results for orders placed or performed in visit on 01/09/24  Microscopic Examination   Collection Time: 01/09/24  2:20 PM   Urine  Result Value Ref Range   WBC, UA 0-5 0 - 5 /hpf   RBC, Urine 0-2 0 - 2 /hpf   Epithelial Cells (non renal) 0-10 0 - 10 /hpf   Bacteria, UA None seen None seen/Few  Urinalysis, Complete   Collection Time: 01/09/24  2:20 PM  Result Value Ref Range   Specific Gravity, UA 1.020 1.005 - 1.030   pH, UA 7.0 5.0 - 7.5   Color, UA Yellow Yellow   Appearance Ur Clear Clear   Leukocytes,UA Negative Negative   Protein,UA Negative Negative/Trace   Glucose, UA Negative Negative   Ketones, UA Negative Negative   RBC, UA Negative Negative   Bilirubin, UA Negative Negative   Urobilinogen, Ur 0.2 0.2 - 1.0 mg/dL   Nitrite, UA Negative Negative   Microscopic  Examination See below:   BLADDER SCAN AMB NON-IMAGING   Collection Time: 01/09/24  2:29 PM  Result Value Ref Range   Scan Result 120 ml    Assessment & Plan:   1. Dysuria (Primary) Suspect inflammatory prostatitis, low suspicion for bacterial etiology in the absence of fevers and with bland UA.  His symptoms have already started to improve with OTC Tylenol alone.  He would like to manage this as conservatively as possible.  We discussed increased prostate specificity of Rapaflo over Flomax and he would be willing to try this again.  Will give him 2 weeks of  Rapaflo and discussed stopping this if he tolerates it poorly.  I also gave him information about common bladder irritants that could worsen his voiding symptoms and we discussed that saw palmetto works similarly to alpha blockers, though I do not anticipate the same potency.  Will send urine for culture and treat him with antibiotics if positive or if his symptoms fail to resolve.  He declines NSAIDs. - Urinalysis, Complete - BLADDER SCAN AMB NON-IMAGING - silodosin (RAPAFLO) 8 MG CAPS capsule; Take 1 capsule (8 mg total) by mouth daily with breakfast for 14 days.  Dispense: 14 capsule; Refill: 0 - CULTURE, URINE COMPREHENSIVE   2. Elevated PSA We had a very lengthy conversation about his history of elevated PSA and the role of prostate biopsy.  We discussed the rare risk of sepsis and that he would be given antibiotics at the time of biopsy to reduce this risk.  He is curious about transperineal biopsy, and I explained that we do not yet perform this approach at this practice, however if he would prefer this he can let Dr. Apolinar Junes know and we would be happy to refer him to another practice that does.  I advised him that I cannot speak to the risk of heart attack or stroke off Eliquis for several days to undergo prostate biopsy, however I anticipate a much higher relative risk of significant bleeding if he remains on Eliquis for biopsy.  We  discussed that if he is ultimately not comfortable pursuing biopsy, I do think that PSA surveillance would be a reasonable alternative at least for now.  For now, we will keep appointment as scheduled.  3. Benign localized prostatic hyperplasia with lower urinary tract symptoms (LUTS) I shared my opinion that cystoscopy overall is a minimally risky test that could provide some valuable clinical information and help guide his therapy and I do recommend he continue with plans for this.  Return for Keep follow-up as scheduled.  I spent 55 minutes on the day of the encounter to include pre-visit record review, face-to-face time with the patient, and post-visit ordering of tests.   Carman Ching, PA-C  [facility] [place] [address], [address] [place], [place] [address] [phone]

## 2024-01-12 LAB — CULTURE, URINE COMPREHENSIVE

## 2024-01-15 ENCOUNTER — Telehealth: Payer: Self-pay | Admitting: *Deleted

## 2024-01-15 DIAGNOSIS — I10 Essential (primary) hypertension: Secondary | ICD-10-CM

## 2024-01-15 DIAGNOSIS — E039 Hypothyroidism, unspecified: Secondary | ICD-10-CM

## 2024-01-15 NOTE — Telephone Encounter (Signed)
 Please call and schedule CPE with fasting labs prior with Dr. Ermalene Searing.

## 2024-01-15 NOTE — Telephone Encounter (Signed)
 Copied from CRM [id number]. Topic: Appointments - Scheduling Inquiry for Clinic >> Jan 15, 2024  1:20 PM Ma Hillock G wrote: Reason for CRM: pt is inquiring about getting his labs completed. I called [facility] but no one available right now. He stated he can come tomorrow for his lab work. Sending to CRM to clinic to schedule lab appt. Thanks

## 2024-01-15 NOTE — Telephone Encounter (Signed)
 LVM for patient to c/b and schedule.

## 2024-01-16 NOTE — Telephone Encounter (Signed)
 [email or web address]

## 2024-01-17 ENCOUNTER — Other Ambulatory Visit: Admitting: Urology

## 2024-01-23 ENCOUNTER — Encounter: Payer: Self-pay | Admitting: Family Medicine

## 2024-01-23 ENCOUNTER — Ambulatory Visit (INDEPENDENT_AMBULATORY_CARE_PROVIDER_SITE_OTHER): Admitting: Family Medicine

## 2024-01-23 VITALS — BP 138/74 | HR 70 | Temp 97.9°F | Ht 66.34 in | Wt 170.6 lb

## 2024-01-23 DIAGNOSIS — Z8619 Personal history of other infectious and parasitic diseases: Secondary | ICD-10-CM | POA: Insufficient documentation

## 2024-01-23 DIAGNOSIS — R0981 Nasal congestion: Secondary | ICD-10-CM | POA: Insufficient documentation

## 2024-01-23 DIAGNOSIS — I5032 Chronic diastolic (congestive) heart failure: Secondary | ICD-10-CM

## 2024-01-23 DIAGNOSIS — I10 Essential (primary) hypertension: Secondary | ICD-10-CM | POA: Diagnosis not present

## 2024-01-23 DIAGNOSIS — E039 Hypothyroidism, unspecified: Secondary | ICD-10-CM | POA: Diagnosis not present

## 2024-01-23 DIAGNOSIS — I480 Paroxysmal atrial fibrillation: Secondary | ICD-10-CM

## 2024-01-23 DIAGNOSIS — R972 Elevated prostate specific antigen [PSA]: Secondary | ICD-10-CM | POA: Diagnosis not present

## 2024-01-23 LAB — COMPREHENSIVE METABOLIC PANEL WITH GFR
ALT: 20 U/L (ref 0–53)
AST: 19 U/L (ref 0–37)
Albumin: 4.6 g/dL (ref 3.5–5.2)
Alkaline Phosphatase: 64 U/L (ref 39–117)
BUN: 18 mg/dL (ref 6–23)
CO2: 28 meq/L (ref 19–32)
Calcium: 9.5 mg/dL (ref 8.4–10.5)
Chloride: 105 meq/L (ref 96–112)
Creatinine, Ser: 1.09 mg/dL (ref 0.40–1.50)
GFR: 69.03 mL/min (ref >60.00)
Glucose, Bld: 94 mg/dL (ref 70–99)
Potassium: 4.7 meq/L (ref 3.5–5.1)
Sodium: 143 meq/L (ref 135–145)
Total Bilirubin: 1 mg/dL (ref 0.2–1.2)
Total Protein: 6.9 g/dL (ref 6.0–8.3)

## 2024-01-23 LAB — CBC WITH DIFFERENTIAL/PLATELET
Basophils Absolute: 0 10*3/uL (ref 0.0–0.1)
Basophils Relative: 0.3 % (ref 0.0–3.0)
Eosinophils Absolute: 0.6 10*3/uL (ref 0.0–0.7)
Eosinophils Relative: 7.4 % — ABNORMAL HIGH (ref 0.0–5.0)
HCT: 42.9 % (ref 39.0–52.0)
Hemoglobin: 14.1 g/dL (ref 13.0–17.0)
Lymphocytes Relative: 20 % (ref 12.0–46.0)
Lymphs Abs: 1.5 10*3/uL (ref 0.7–4.0)
MCHC: 32.8 g/dL (ref 30.0–36.0)
MCV: 90.4 fl (ref 78.0–100.0)
Monocytes Absolute: 0.7 10*3/uL (ref 0.1–1.0)
Monocytes Relative: 9.1 % (ref 3.0–12.0)
Neutro Abs: 4.8 10*3/uL (ref 1.4–7.7)
Neutrophils Relative %: 63.2 % (ref 43.0–77.0)
Platelets: 245 10*3/uL (ref 150.0–400.0)
RBC: 4.75 Mil/uL (ref 4.22–5.81)
RDW: 13.4 % (ref 11.5–15.5)
WBC: 7.6 10*3/uL (ref 4.0–10.5)

## 2024-01-23 LAB — LIPID PANEL
Cholesterol: 150 mg/dL (ref 0–200)
HDL: 54.6 mg/dL (ref >39.00)
LDL Cholesterol: 75 mg/dL (ref 0–99)
NonHDL: 95.19
Total CHOL/HDL Ratio: 3
Triglycerides: 99 mg/dL (ref 0.0–149.0)
VLDL: 19.8 mg/dL (ref 0.0–40.0)

## 2024-01-23 LAB — T4, FREE: Free T4: 1.01 ng/dL (ref 0.60–1.60)

## 2024-01-23 LAB — TSH: TSH: 0.43 u[IU]/mL (ref 0.35–5.50)

## 2024-01-23 LAB — T3, FREE: T3, Free: 4 pg/mL (ref 2.3–4.2)

## 2024-01-23 NOTE — Assessment & Plan Note (Signed)
 Due for reevaluation

## 2024-01-23 NOTE — Assessment & Plan Note (Signed)
 Acute,  no clear sign of  bacterial infection. Symptoms possibly secondary to allergies versus residual inflammation and congestion from previous infection. Patient has not had allergies in the past denies sneezing or itchy eyes. Recommend starting Flonase 2 sprays per nostril daily for the next 1 to [redacted] weeks along with possible nasal saline spray.  If not improving as expected consider oral prednisone course versus referral to ENT.  Note

## 2024-01-23 NOTE — Assessment & Plan Note (Signed)
 Followed by Dr. [facility]  MRI showed prostate  lesion.  Considering biopsy, has cystoscopy planned.

## 2024-01-23 NOTE — Assessment & Plan Note (Signed)
 He reports in the last several months he has frequent infections possibly secondary to watching grandkids.  He is concerned that you may have a decreased immune system. Will check CBC.

## 2024-01-23 NOTE — Addendum Note (Signed)
 Addended by: Bernadene Brewer on: 01/23/2024 09:28 AM   Modules accepted: Orders

## 2024-01-23 NOTE — Patient Instructions (Addendum)
 Please stop at the lab to have labs drawn  Start nasal saline spray or irrigation., flonase 2 spray per nostril daily... call if not improving as expected.

## 2024-01-23 NOTE — Assessment & Plan Note (Signed)
Stable, chronic.  Continue current medication.  Euvolemic in office today

## 2024-01-23 NOTE — Assessment & Plan Note (Signed)
Stable, chronic.  On no medication.  Has improved with weight loss.

## 2024-01-23 NOTE — Progress Notes (Addendum)
 Patient ID: Alex Huffman, male    DOB: January 24, 1956, 68 y.o.   MRN: [id number]  This visit was conducted in person.  BP 138/74   Pulse 70   Temp 97.9 F (36.6 C) (Oral)   Ht 5' 6.34" (1.685 m)   Wt 170 lb 9.6 oz (77.4 kg)   SpO2 95%   BMI 27.25 kg/m    CC:  Chief Complaint  Patient presents with   Annual Exam    Has a running nose on and off for awhile    Subjective:   HPI: Alex Huffman is a 68 y.o. male presenting on 01/23/2024 for Annual Exam (Has a running nose on and off for awhile)  The patient presents for  review of chronic health problems. He also has the following acute concerns today:  Treated for  sinus infection in 12/3023.. improved for 1 week.  Continue nasal discharge copious... copious.  No sneeze, no itchy eyes.  Wakes up with sore throat.  NO SOB, no wheeze.  No fever.  Grand kids sick off and on.  The patient saw a LPN or RN for medicare wellness visit. 11/04/2023  Prevention and wellness was reviewed in detail. Note reviewed and important notes copied below.    Elevated PSA now followed by urology, Dr. Apolinar Junes... reviewed 01/09/2024 OV.  Hypertension:  Improved control on no medication. BP Readings from Last 3 Encounters:  01/23/24 138/74  01/09/24 (!) 164/101  12/19/23 130/78  Using medication without problems or lightheadedness: none Chest pain with exertion:none Edema: none Short of breath:none Average home BPs: Other issues:  Chronic diastolic heart failure: followed by cardiology. No fluid overload. Paroxsymal atrial fibrillation on Eliquis anticoagulation.  Hypothyroidism:  Due for re-eval. Lab Results  Component Value Date   TSH 3.03 12/14/2022    Was having abdominal pain, reviewed last labs , x-rays from end of 2023 AST and ALT elevated 128 and 301 respectively.Marland Kitchen decreased to 65 and 206  [place] 09/26/22 : Heterogeneous liver often attributable to fatty infiltration.  Cholelithiasis. Gallbladder region tenderness but no  inflammatory changes to imply cholecystitis. GGT was 173. No ETOH, neg hep panel  Had abnormal CT abdomen  09/28/22 showing MPRESSION: 1. 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 The vessel remains patent although with some associated stenosis. Negative for associated pseudoaneurysm. Suspect this is the symptomatic abnormality. But no other vasculitis is identified in the abdomen or pelvis. And only mild for age aortic atherosclerosis.   2. No other acute or inflammatory process identified in the abdomen or pelvis. Chronic left nephrolithiasis, cholecystitis, distal large bowel diverticulosis.  3.Chronic 6-7 mm gallstone within the neck of the gallbladder, but no gallbladder distension or regional inflammation.     Wt Readings from Last 3 Encounters:  01/23/24 170 lb 9.6 oz (77.4 kg)  12/19/23 175 lb 8 oz (79.6 kg)  11/04/23 170 lb (77.1 kg)  He has been working on [employer]. Exercising daily 30-40 min.  Relevant past medical, surgical, family and social history reviewed and updated as indicated. Interim medical history since our last visit reviewed. Allergies and medications reviewed and updated. Outpatient Medications Prior to Visit  Medication Sig Dispense Refill   apixaban (ELIQUIS) 5 MG TABS tablet Take 1 tablet (5 mg total) by mouth 2 (two) times daily. 60 tablet 5   levothyroxine (SYNTHROID) 25 MCG tablet Take 1 tablet by mouth once daily 90 tablet 0   silodosin (RAPAFLO) 8 MG  CAPS capsule Take 1 capsule (8 mg total) by mouth daily with breakfast for 14 days. (Patient not taking: Reported on 01/23/2024) 14 capsule 0   amoxicillin (AMOXIL) 500 MG capsule Take 2 capsules (1,000 mg total) by mouth 2 (two) times daily. (Patient not taking: Reported on 01/09/2024) 40 capsule 0   guaiFENesin-codeine 100-10 MG/5ML syrup Take 5-10 mLs by mouth at bedtime as needed for cough. (Patient not taking: Reported on 01/09/2024) 120 mL 0    No facility-administered medications prior to visit.     Per HPI unless specifically indicated in ROS section below Review of Systems  Constitutional:  Negative for fatigue and fever.  HENT:  Negative for ear pain.   Eyes:  Negative for pain.  Respiratory:  Negative for cough and shortness of breath.   Cardiovascular:  Negative for chest pain, palpitations and leg swelling.  Gastrointestinal:  Negative for abdominal pain.  Genitourinary:  Negative for dysuria.  Musculoskeletal:  Negative for arthralgias.  Neurological:  Negative for syncope, light-headedness and headaches.  Psychiatric/Behavioral:  Negative for dysphoric mood.     Objective:  BP 138/74   Pulse 70   Temp 97.9 F (36.6 C) (Oral)   Ht 5' 6.34" (1.685 m)   Wt 170 lb 9.6 oz (77.4 kg)   SpO2 95%   BMI 27.25 kg/m   Wt Readings from Last 3 Encounters:  01/23/24 170 lb 9.6 oz (77.4 kg)  12/19/23 175 lb 8 oz (79.6 kg)  11/04/23 170 lb (77.1 kg)      Physical Exam Constitutional:      Appearance: He is well-developed.  HENT:     Head: Normocephalic.     Right Ear: Hearing normal.     Left Ear: Hearing normal.     Nose: Nose normal.  Neck:     Thyroid: No thyroid mass or thyromegaly.     Vascular: No carotid bruit.     Trachea: Trachea normal.  Cardiovascular:     Rate and Rhythm: Normal rate and regular rhythm.     Pulses: Normal pulses.     Heart sounds: Heart sounds not distant. No murmur heard.    No friction rub. No gallop.     Comments: No peripheral edema Pulmonary:     Effort: Pulmonary effort is normal. No respiratory distress.     Breath sounds: Normal breath sounds.  Abdominal:     Tenderness: There is generalized abdominal tenderness.  Skin:    General: Skin is warm and dry.     Findings: No rash.  Psychiatric:        Speech: Speech normal.        Behavior: Behavior normal.        Thought Content: Thought content normal.       Results for orders placed or performed in visit on  01/09/24  Microscopic Examination   Collection Time: 01/09/24  2:20 PM   Urine  Result Value Ref Range   WBC, UA 0-5 0 - 5 /hpf   RBC, Urine 0-2 0 - 2 /hpf   Epithelial Cells (non renal) 0-10 0 - 10 /hpf   Bacteria, UA None seen None seen/Few  Urinalysis, Complete   Collection Time: 01/09/24  2:20 PM  Result Value Ref Range   Specific Gravity, UA 1.020 1.005 - 1.030   pH, UA 7.0 5.0 - 7.5   Color, UA Yellow Yellow   Appearance Ur Clear Clear   Leukocytes,UA Negative Negative   Protein,UA Negative Negative/Trace   Glucose,  UA Negative Negative   Ketones, UA Negative Negative   RBC, UA Negative Negative   Bilirubin, UA Negative Negative   Urobilinogen, Ur 0.2 0.2 - 1.0 mg/dL   Nitrite, UA Negative Negative   Microscopic Examination See below:   BLADDER SCAN AMB NON-IMAGING   Collection Time: 01/09/24  2:29 PM  Result Value Ref Range   Scan Result 120 ml   CULTURE, URINE COMPREHENSIVE   Collection Time: 01/09/24  4:05 PM   Specimen: Urine   UR  Result Value Ref Range   Urine Culture, Comprehensive Final report    Organism ID, Bacteria Comment     Assessment and Plan The patient's preventative maintenance and recommended screening tests for an annual wellness exam were reviewed in full today. Brought up to date unless services declined.  Counselled on the importance of diet, exercise, and its role in overall health and mortality. The patient's FH and SH was reviewed, including their home life, tobacco status, and drug and alcohol status.    Vaccines: COVID x 1 JJ, refused flu (refuses), Td,  uptodate with shingrix Prostate Cancer Screen:  see above Colon Cancer Screen:  2019 Dr. Willy Harvest,  repeat due in 10 years      Smoking Status: none ETOH/ drug use: none/none Hep C:  done   Paroxysmal atrial fibrillation [facility] [address]) Assessment & Plan: Chronic, followed by cardiology.  On Eliquis anticoagulation. Per patient decreased episodes of palpitations now on  magnesium.   Primary hypertension Assessment & Plan: Stable, chronic.  On no medication.  Has improved with weight loss.    Hypothyroidism, unspecified type Assessment & Plan: Due for reevaluation   Elevated PSA Assessment & Plan:  Followed by Dr. Ace [facility]  MRI showed prostate  lesion.  Considering biopsy, has cystoscopy planned.  Orders: -     PSA, total and free  Chronic diastolic congestive heart failure (HCC) Assessment & Plan: Stable, chronic.  Continue current medication.  Euvolemic in office today   Nasal congestion Assessment & Plan:  Acute,  no clear sign of  bacterial infection. Symptoms possibly secondary to allergies versus residual inflammation and congestion from previous infection. Patient has not had allergies in the past denies sneezing or itchy eyes. Recommend starting Flonase 2 sprays per nostril daily for the next 1 to [redacted] weeks along with possible nasal saline spray.  If not improving as expected consider oral prednisone course versus referral to ENT.  Note    Frequent infections Assessment & Plan: He reports in the last several months he has frequent infections possibly secondary to watching grandkids.  He is concerned that you may have a decreased immune system. Will check CBC.  Orders: -     CBC with Differential/Platelet      No follow-ups on file.   Herby Lolling, MD

## 2024-01-23 NOTE — Assessment & Plan Note (Signed)
Chronic, followed by cardiology.  On Eliquis anticoagulation. Per patient decreased episodes of palpitations now on magnesium.

## 2024-01-27 ENCOUNTER — Encounter: Payer: Self-pay | Admitting: Family Medicine

## 2024-01-27 LAB — PSA, TOTAL AND FREE
PSA, % Free: 19 % — ABNORMAL LOW (ref >25)
PSA, Free: 1.8 ng/mL
PSA, Total: 9.7 ng/mL — ABNORMAL HIGH (ref <=4.0)

## 2024-01-28 ENCOUNTER — Encounter: Payer: Self-pay | Admitting: Family Medicine

## 2024-02-13 ENCOUNTER — Other Ambulatory Visit: Payer: Self-pay | Admitting: Family Medicine

## 2024-02-13 DIAGNOSIS — E039 Hypothyroidism, unspecified: Secondary | ICD-10-CM

## 2024-02-13 NOTE — Telephone Encounter (Signed)
 Copied from CRM [id number]. Topic: Clinical - Medication Refill >> Feb 13, 2024  3:51 PM Chuck Crater wrote: Medication: levothyroxine  (SYNTHROID ) 25 MCG tablet  Has the patient contacted their pharmacy? Yes (Agent: If no, request that the patient contact the pharmacy for the refill. If patient does not wish to contact the pharmacy document the reason why and proceed with request.) (Agent: If yes, when and what did the pharmacy advise?) advised hasn't been called  This is the patient's preferred pharmacy:  [facility] [address], [place] - [address] [place] [address] Phone: [phone] Fax: [phone]  Is this the correct pharmacy for this prescription? Yes If no, delete pharmacy and type the correct one.   Has the prescription been filled recently? No  Is the patient out of the medication? No running low going out town next week (Tuesday)  Has the patient been seen for an appointment in the last year OR does the patient have an upcoming appointment? Yes  Can we respond through MyChart? Yes  Agent: Please be advised that Rx refills may take up to 3 business days. We ask that you follow-up with your pharmacy.

## 2024-02-19 ENCOUNTER — Observation Stay
Admission: EM | Admit: 2024-02-19 | Discharge: 2024-02-20 | Disposition: A | Discharge status: Home / Self Care | Attending: Osteopathic Medicine | Admitting: Osteopathic Medicine

## 2024-02-19 ENCOUNTER — Inpatient Hospital Stay

## 2024-02-19 ENCOUNTER — Other Ambulatory Visit: Payer: Self-pay

## 2024-02-19 ENCOUNTER — Emergency Department

## 2024-02-19 DIAGNOSIS — I5032 Chronic diastolic (congestive) heart failure: Secondary | ICD-10-CM | POA: Diagnosis not present

## 2024-02-19 DIAGNOSIS — K56609 Unspecified intestinal obstruction, unspecified as to partial versus complete obstruction: Principal | ICD-10-CM | POA: Diagnosis present

## 2024-02-19 DIAGNOSIS — I700 Atherosclerosis of aorta: Secondary | ICD-10-CM | POA: Diagnosis not present

## 2024-02-19 DIAGNOSIS — K8020 Calculus of gallbladder without cholecystitis without obstruction: Secondary | ICD-10-CM | POA: Diagnosis not present

## 2024-02-19 DIAGNOSIS — Z79899 Other long term (current) drug therapy: Secondary | ICD-10-CM | POA: Insufficient documentation

## 2024-02-19 DIAGNOSIS — Z7901 Long term (current) use of anticoagulants: Secondary | ICD-10-CM | POA: Diagnosis not present

## 2024-02-19 DIAGNOSIS — R1011 Right upper quadrant pain: Principal | ICD-10-CM

## 2024-02-19 DIAGNOSIS — I480 Paroxysmal atrial fibrillation: Secondary | ICD-10-CM | POA: Diagnosis not present

## 2024-02-19 DIAGNOSIS — E039 Hypothyroidism, unspecified: Secondary | ICD-10-CM | POA: Diagnosis not present

## 2024-02-19 DIAGNOSIS — Z6841 Body Mass Index (BMI) 40.0 and over, adult: Secondary | ICD-10-CM | POA: Diagnosis not present

## 2024-02-19 DIAGNOSIS — Z9889 Other specified postprocedural states: Secondary | ICD-10-CM | POA: Insufficient documentation

## 2024-02-19 DIAGNOSIS — R110 Nausea: Secondary | ICD-10-CM

## 2024-02-19 DIAGNOSIS — R10813 Right lower quadrant abdominal tenderness: Secondary | ICD-10-CM | POA: Diagnosis not present

## 2024-02-19 DIAGNOSIS — E663 Overweight: Secondary | ICD-10-CM | POA: Insufficient documentation

## 2024-02-19 DIAGNOSIS — R10811 Right upper quadrant abdominal tenderness: Secondary | ICD-10-CM | POA: Diagnosis present

## 2024-02-19 LAB — URINALYSIS, ROUTINE W REFLEX MICROSCOPIC
Bilirubin Urine: NEGATIVE
Glucose, UA: NEGATIVE mg/dL
Hgb urine dipstick: NEGATIVE
Ketones, ur: NEGATIVE mg/dL
Leukocytes,Ua: NEGATIVE
Nitrite: NEGATIVE
Protein, ur: NEGATIVE mg/dL
Specific Gravity, Urine: >1.046 — ABNORMAL HIGH (ref 1.005–1.030)
pH: 6 (ref 5.0–8.0)

## 2024-02-19 LAB — CBC
HCT: 45.2 % (ref 39.0–52.0)
Hemoglobin: 14.6 g/dL (ref 13.0–17.0)
MCH: 29.2 pg (ref 26.0–34.0)
MCHC: 32.3 g/dL (ref 30.0–36.0)
MCV: 90.4 fL (ref 80.0–100.0)
Platelets: 203 10*3/uL (ref 150–400)
RBC: 5 MIL/uL (ref 4.22–5.81)
RDW: 12.6 % (ref 11.5–15.5)
WBC: 11.7 10*3/uL — ABNORMAL HIGH (ref 4.0–10.5)
nRBC: 0.3 % — ABNORMAL HIGH (ref 0.0–0.2)

## 2024-02-19 LAB — COMPREHENSIVE METABOLIC PANEL WITH GFR
ALT: 25 U/L (ref 0–44)
AST: 24 U/L (ref 15–41)
Albumin: 4.4 g/dL (ref 3.5–5.0)
Alkaline Phosphatase: 48 U/L (ref 38–126)
Anion gap: 10 (ref 5–15)
BUN: 33 mg/dL — ABNORMAL HIGH (ref 8–23)
CO2: 24 mmol/L (ref 22–32)
Calcium: 9.4 mg/dL (ref 8.9–10.3)
Chloride: 105 mmol/L (ref 98–111)
Creatinine, Ser: 1.08 mg/dL (ref 0.61–1.24)
GFR, Estimated: >60 mL/min (ref >60)
Glucose, Bld: 153 mg/dL — ABNORMAL HIGH (ref 70–99)
Potassium: 4 mmol/L (ref 3.5–5.1)
Sodium: 139 mmol/L (ref 135–145)
Total Bilirubin: 1 mg/dL (ref 0.0–1.2)
Total Protein: 7.3 g/dL (ref 6.5–8.1)

## 2024-02-19 LAB — HIV ANTIBODY (ROUTINE TESTING W REFLEX): HIV Screen 4th Generation wRfx: NONREACTIVE

## 2024-02-19 LAB — LIPASE, BLOOD: Lipase: 27 U/L (ref 11–51)

## 2024-02-19 LAB — LACTIC ACID, PLASMA
Lactic Acid, Venous: 1.5 mmol/L (ref 0.5–1.9)
Lactic Acid, Venous: 1.5 mmol/L (ref 0.5–1.9)

## 2024-02-19 MED ORDER — SODIUM CHLORIDE 0.9 % IV SOLN: 12.5000 mg | Freq: Four times a day (QID) | INTRAVENOUS | Status: DC | PRN

## 2024-02-19 MED ORDER — LEVOTHYROXINE SODIUM 50 MCG PO TABS
25.0000 ug | ORAL_TABLET | Freq: Every day | ORAL | Status: DC
  Administered 2024-02-19 – 2024-02-20 (×2): 25 ug via ORAL
  Filled 2024-02-19 (×2): qty 1

## 2024-02-19 MED ORDER — ACETAMINOPHEN 500 MG PO TABS
1000.0000 mg | ORAL_TABLET | Freq: Once | ORAL | Status: AC
  Administered 2024-02-19: 1000 mg via ORAL
  Filled 2024-02-19: qty 2

## 2024-02-19 MED ORDER — HYDROMORPHONE HCL 1 MG/ML IJ SOLN: 0.5000 mg | INTRAMUSCULAR | Status: AC | PRN

## 2024-02-19 MED ORDER — ONDANSETRON HCL 4 MG/2ML IJ SOLN: 4.0000 mg | Freq: Three times a day (TID) | INTRAMUSCULAR | Status: AC | PRN

## 2024-02-19 MED ORDER — ENOXAPARIN SODIUM 40 MG/0.4ML IJ SOSY
40.0000 mg | PREFILLED_SYRINGE | INTRAMUSCULAR | Status: DC
  Administered 2024-02-19: 40 mg via SUBCUTANEOUS
  Filled 2024-02-19 (×2): qty 0.4

## 2024-02-19 MED ORDER — KETOROLAC TROMETHAMINE 15 MG/ML IJ SOLN: 15.0000 mg | Freq: Once | INTRAMUSCULAR | Status: DC

## 2024-02-19 MED ORDER — MORPHINE SULFATE (PF) 4 MG/ML IV SOLN: 4.0000 mg | Freq: Once | INTRAVENOUS | Status: DC

## 2024-02-19 MED ORDER — HYDROMORPHONE HCL 1 MG/ML IJ SOLN: 1.0000 mg | INTRAMUSCULAR | Status: DC | PRN

## 2024-02-19 MED ORDER — SODIUM CHLORIDE 0.9 % IV SOLN
1.0000 g | Freq: Once | INTRAVENOUS | Status: DC
  Filled 2024-02-19: qty 10

## 2024-02-19 MED ORDER — ONDANSETRON HCL 4 MG/2ML IJ SOLN
4.0000 mg | Freq: Once | INTRAMUSCULAR | Status: AC
  Administered 2024-02-19: 4 mg via INTRAVENOUS
  Filled 2024-02-19: qty 2

## 2024-02-19 MED ORDER — LACTATED RINGERS IV SOLN: INTRAVENOUS | Status: DC

## 2024-02-19 MED ORDER — IOHEXOL 300 MG/ML  SOLN
100.0000 mL | Freq: Once | INTRAMUSCULAR | Status: AC | PRN
  Administered 2024-02-19: 100 mL via INTRAVENOUS

## 2024-02-19 MED ORDER — METRONIDAZOLE 500 MG/100ML IV SOLN
500.0000 mg | Freq: Once | INTRAVENOUS | Status: DC
  Filled 2024-02-19: qty 100

## 2024-02-19 MED ORDER — SODIUM CHLORIDE 0.9 % IV BOLUS
500.0000 mL | Freq: Once | INTRAVENOUS | Status: AC
  Administered 2024-02-19: 500 mL via INTRAVENOUS

## 2024-02-19 MED ORDER — HYDROMORPHONE HCL 1 MG/ML IJ SOLN: 0.5000 mg | INTRAMUSCULAR | Status: DC | PRN

## 2024-02-19 NOTE — ED Notes (Signed)
Informed RN bed assigned 

## 2024-02-19 NOTE — ED Notes (Signed)
 Report given to [facility] and pt transported over to cpod at this time by EDT Alexia. Pt updated on plans for doctor to come and to speak with him regarding medications and plans. Pt verbalized understanding.

## 2024-02-19 NOTE — ED Notes (Signed)
 Dr. Vallarie Gauze said to hold off on antibiotics and NG tube until patient is seen and to initiate aspiration precautions in the meantime.

## 2024-02-19 NOTE — ED Notes (Signed)
 Patient transported to X-ray

## 2024-02-19 NOTE — ED Notes (Signed)
 Pt tolerating lunch tray without n/v/abd pain

## 2024-02-19 NOTE — Consult Note (Signed)
 [facility] SURGICAL CONSULTATION NOTE (initial) - cpt: 59563   HISTORY OF PRESENT ILLNESS (HPI):  68 y.o. male presented to [facility] ED overnight for evaluation of abdominal pain. Patient reports the acute onset of primarily right sided abdominal pain. This was worst in RLQ. This was accompanied by nausea and emesis. No fever, chills, cough, CP, SOB, urinary changes. He denied any sick contacts, recent travel, o abnormal food exposures. No history of obstruction previously. Only previous abdominal surgery is open right inguinal hernia repair. He is on Eliquis  secondary to a history of Atrial fibrillation also s/p ablation. Work up in the ED revealed a mild leukocytosis to 11.7K, Hgb to 14.6, renal function normal with sCr - 1.08, no electrolyte derangements, and venous lactate normal x2. CT Abdomen/Pelvis was obtained and concerning for small bowel obstruction. NGT was recommended but patient refused.   This morning, he reports he feels significantly better. Nausea and emesis have subsided. Abdominal pain now at the worst is 2/10, mainly still in RLQ. He reports passing flatus yesterday but not this morning. Last BM was yesterday as well, this was diarrhea, no blood.   Surgery is consulted by hospitalist physician Dr. Antoniette Batty, MD in this context for evaluation and management of SBO.   PAST MEDICAL HISTORY (PMH):  Past Medical History:  Diagnosis Date   Anxiety    Atrial fibrillation (HCC)    Cataract    Chronic headaches    Helicobacter pylori gastritis 2009   EGD   Hyperlipemia    Personal history of colonic polyps      PAST SURGICAL HISTORY (PSH):  Past Surgical History:  Procedure Laterality Date   CARDIOVERSION N/A 11/09/2020   Procedure: CARDIOVERSION;  Surgeon: Harrold Lincoln, MD;  Location: [facility] ENDOSCOPY;  Service: Cardiovascular;  Laterality: N/A;   COLONOSCOPY  , 06/08/2011   2009: two 3 mm hyperplastic polyps, lipoma, internal hemorrjhoids 2012: 4mm cecal  serrated adenoma, lipoma, internal hemorrhoids   HERNIA REPAIR  05/04   ROTATOR CUFF REPAIR  05/06   UPPER GASTROINTESTINAL ENDOSCOPY  02/02/2008   erosive gastropathy     MEDICATIONS:  Prior to Admission medications   Medication Sig Start Date End Date Taking? Authorizing Provider  apixaban  (ELIQUIS ) 5 MG TABS tablet Take 1 tablet (5 mg total) by mouth 2 (two) times daily. 05/09/21   Nahser, Lela Purple, MD  levothyroxine  (SYNTHROID ) 25 MCG tablet Take 1 tablet by mouth once daily 02/13/24   Cherlyn Cornet, Amy E, MD     ALLERGIES:  Allergies  Allergen Reactions   Typhoid Vaccine, Live Other (See Comments) and Anaphylaxis   Typhoid Vi Polysaccharide Vaccine Anaphylaxis and Other (See Comments)    Comatose, almost died    Amlodipine Other (See Comments)    Leg pain and fatigue   Atorvastatin  Diarrhea    Body ache   Azithromycin  Other (See Comments)    Rapid Heart Rate/Palpitations   Doxycycline Other (See Comments)    Severe Stomach Issues   Nitrofurantoin Other (See Comments) and Nausea And Vomiting   Sulfa Antibiotics Hives     SOCIAL HISTORY:  Social History   Socioeconomic History   Marital status: Married    Spouse name: Not on file   Number of children: 3   Years of education: Not on file   Highest education level: Not on file  Occupational History   Occupation: Retired  Tobacco Use   Smoking status: Never   Smokeless tobacco: Never  Vaping Use   Vaping status:  Never Used  Substance and Sexual Activity   Alcohol use: Not Currently    Comment: Occasional beer   Drug use: No   Sexual activity: Yes    Partners: Female    Birth control/protection: None  Other Topics Concern   Not on file  Social History Narrative   Not on file   Social Drivers of Health   Financial Resource Strain: Low Risk  (11/04/2023)   Overall Financial Resource Strain (CARDIA)    Difficulty of Paying Living Expenses: Not hard at all  Food Insecurity: No Food Insecurity (11/04/2023)   Hunger  Vital Sign    Worried About Running Out of Food in the Last Year: Never true    Ran Out of Food in the Last Year: Never true  Transportation Needs: No Transportation Needs (11/04/2023)   PRAPARE - Administrator, Civil Service (Medical): No    Lack of Transportation (Non-Medical): No  Physical Activity: Insufficiently Active (11/04/2023)   Exercise Vital Sign    Days of Exercise per Week: 3 days    Minutes of Exercise per Session: 40 min  Stress: No Stress Concern Present (11/04/2023)   [employer] of Occupational Health - Occupational Stress Questionnaire    Feeling of Stress : Not at all  Social Connections: Moderately Integrated (11/04/2023)   Social Connection and Isolation Panel [NHANES]    Frequency of Communication with Friends and Family: More than three times a week    Frequency of Social Gatherings with Friends and Family: More than three times a week    Attends Religious Services: 1 to 4 times per year    Active Member of [employer] or Organizations: No    Attends Banker Meetings: Never    Marital Status: Married  Catering manager Violence: Not At Risk (11/04/2023)   Humiliation, Afraid, Rape, and Kick questionnaire    Fear of Current or Ex-Partner: No    Emotionally Abused: No    Physically Abused: No    Sexually Abused: No     FAMILY HISTORY:  Family History  Problem Relation Age of Onset   Heart disease Father    Celiac disease Mother    Colon cancer Neg Hx    Esophageal cancer Neg Hx    Liver cancer Neg Hx    Pancreatic cancer Neg Hx    Rectal cancer Neg Hx    Stomach cancer Neg Hx    Allergic rhinitis Neg Hx       REVIEW OF SYSTEMS:  Review of Systems  Constitutional:  Negative for chills and fever.  HENT:  Negative for congestion and sore throat.   Respiratory:  Negative for cough and shortness of breath.   Cardiovascular:  Negative for chest pain and palpitations.  Gastrointestinal:  Positive for abdominal pain, diarrhea,  nausea (Now resolved) and vomiting (Now resolved). Negative for blood in stool and constipation.  Genitourinary:  Negative for dysuria and urgency.  All other systems reviewed and are negative.   VITAL SIGNS:  Temp:  [97.5 F (36.4 C)-98 F (36.7 C)] 97.7 F (36.5 C) (05/14 0815) Pulse Rate:  [51-65] 62 (05/14 0759) Resp:  [16-20] 20 (05/14 0721) BP: (111-166)/(63-99) 123/82 (05/14 0759) SpO2:  [94 %-98 %] 96 % (05/14 0759) Weight:  [80.4 kg] 80.4 kg (05/14 0759)       Weight: 80.4 kg     INTAKE/OUTPUT:  No intake/output data recorded.  PHYSICAL EXAM:  Physical Exam Vitals and nursing note reviewed. Exam conducted with  a chaperone present.  Constitutional:      General: He is not in acute distress.    Appearance: He is well-developed and normal weight. He is not ill-appearing.     Comments: Resting in bed; NAD  HENT:     Head: Normocephalic and atraumatic.  Eyes:     General: No scleral icterus.    Extraocular Movements: Extraocular movements intact.  Cardiovascular:     Rate and Rhythm: Normal rate and regular rhythm.     Heart sounds: Normal heart sounds.  Pulmonary:     Effort: Pulmonary effort is normal. No respiratory distress.  Abdominal:     General: Abdomen is flat. There is no distension.     Palpations: Abdomen is soft.     Tenderness: There is abdominal tenderness in the right lower quadrant. There is no guarding or rebound. Negative signs include Murphy's sign and McBurney's sign.     Comments: Abdomen is soft, he still appears tender to RLQ, non-distended, no rebound/guarding. He is certainly not peritonitic  Genitourinary:    Comments: Deferred Skin:    General: Skin is warm and dry.     Coloration: Skin is not jaundiced.  Neurological:     General: No focal deficit present.     Mental Status: He is alert and oriented to person, place, and time.      Labs:     Latest Ref Rng & Units 02/19/2024    3:28 AM 01/23/2024    9:18 AM 09/25/2022   10:27 AM   CBC  WBC 4.0 - 10.5 K/uL 11.7  7.6  7.2   Hemoglobin 13.0 - 17.0 g/dL 40.9  81.1  91.4   Hematocrit 39.0 - 52.0 % 45.2  42.9  42.9   Platelets 150 - 400 K/uL 203  245.0  281.0       Latest Ref Rng & Units 02/19/2024    3:28 AM 01/23/2024    9:27 AM 12/14/2022    3:18 PM  CMP  Glucose 70 - 99 mg/dL 782  94  956   BUN 8 - 23 mg/dL 33  18  28   Creatinine 0.61 - 1.24 mg/dL 2.13  0.86  5.78   Sodium 135 - 145 mmol/L 139  143  142   Potassium 3.5 - 5.1 mmol/L 4.0  4.7  4.8   Chloride 98 - 111 mmol/L 105  105  107   CO2 22 - 32 mmol/L 24  28  25    Calcium  8.9 - 10.3 mg/dL 9.4  9.5  9.5   Total Protein 6.5 - 8.1 g/dL 7.3  6.9  6.9   Total Bilirubin 0.0 - 1.2 mg/dL 1.0  1.0  0.6   Alkaline Phos 38 - 126 U/L 48  64    AST 15 - 41 U/L 24  19  21    ALT 0 - 44 U/L 25  20  32      Imaging studies:   CT Abdomen/Pelvis (02/19/2024) personally reviewed with dilation of the stomach and small bowel in RLQ, some thickening present to bowel in RLQ, appendix appears grossly normal, cholelithiasis without evidence of cholecystitis, and radiologist report reviewed below: IMPRESSION: 1. Positive for abnormal small bowel throughout the right abdomen: Gradually dilated and fluid-filled and/or thick-walled, with abrupt transition point just upstream of the terminal ileum. Inflammation in the affected small bowel mesentery. Trace free fluid in the pelvis. No pneumoperitoneum. Favor Acute Small Bowel Obstruction at the TI, no obstructing etiology is evident.  The distribution of loops argues against an internal hernia or closed loop obstruction.   2. Dilated and fluid-filled Stomach also, the duodenum is decompressed. Consider NG tube decompression.   3. Large bowel is decompressed and the appendix is normal.   4. Cholelithiasis without evidence of acute cholecystitis.   5. Left nephrolithiasis. Prostatomegaly. Aortic Atherosclerosis (ICD10-I70.0), with resolved inflammation of the celiac artery  trunk since 2023.   KUB (02/19/2024 - 0930) personally reviewed without stomach dilation, small bowel without air fluid level, air and stool throughout the colon, and radiologist report pending...   Assessment/Plan: (ICD-10's: K11.609) 68 y.o. male presenting with, now improved, abdominal pain, nausea, and emesis concerning for possible SBO, also consider enteritis, complicated by pertinent comorbidities including history of atrial fibrillation.   - Overall, clinical picture is not very clear cut. He certainly has distension of the stomach and small bowel in RLQ, but no previous intra-abdominal surgical history to consider SBO secondary to adhesions. This certainly may be enteritis given thickening of the bowel seen on CT, accompanying N/V/D, and rapid improvement in symptoms. Repeat XR this AM showed resolution in small bowel dilation and no significant air/fluid dilation of small bowel. I had a long discussion with him about this as he was expecting to travel this week for a family event.   - I do think we can trial CLD  - We can hold off on NGT for now given resolution in his symptoms  - No need for emergent surgical intervention. I did discuss that if he were to worsen, or fail to improve, we would need to strongly consider this. We also may not have clear cut answer as to what is going on, especially given his "virgin" abdomen.   - Monitor abdominal examination; on-going bowel function  - Serial KUB as needed; low threshold to consider gastrografin challenge    - Pain control prn; antiemetics prn  - Follow up CBC, BMP this AM - Mobilize   - DVT prophylaxis; will hold Eliquis  for now to ensure no need for surgical intervention    - Discharge Planning: Although patient very concerned about travel plans, I do think he needs to stay another 24 hours at least to ensure he can tolerate diet without issue. Should he wish to leave any earlier, would recommend he sign out AMA. If he does well,  hopefully, we can DC home tomorrow.   All of the above findings and recommendations were discussed with the patient, and all of patient's questions were answered to his expressed satisfaction.  Thank you for the opportunity to participate in this patient's care.   -- Apolonio Bay, PA-C [facility] 02/19/2024, 8:51 AM M-F: 7am - 4pm

## 2024-02-19 NOTE — ED Notes (Addendum)
 Medication, fluid and NG tube orders incomplete at this time due to patients extensive list of questions on side of effects of medications due to his PVCs, why he needs the NG tube, how long it has to stay, and why he needs antibiotics. Dr. Vallarie Gauze notified and this RN is awaiting response.

## 2024-02-19 NOTE — ED Provider Notes (Addendum)
 [facility] Provider Note    Event Date/Time   First MD Initiated Contact with Patient 02/19/24 [phone]     (approximate)   History   Abdominal Pain   HPI  Alex Huffman is a 68 y.o. male   Past medical history of atrial fibrillation status post ablation on Eliquis , gallstone, history of H. pylori gastritis who presents emergency department with acute onset upper abdominal pain right upper quadrant with nausea.  This was after dinner.  The pain is mostly centered in the right upper quadrant but complains of diffuse abdominal pain as well.  Bowel movements have been normal, no urinary symptoms.  Independent Historian contributed to assessment above: Wife is at bedside to corroborate information past medical history as above  External Medical Documents Reviewed: Outpatient notes from 2024 as well as a abdominal ultrasound that showed a gallstone but no cholecystitis      Physical Exam   Triage Vital Signs: ED Triage Vitals  Encounter Vitals Group     BP 02/19/24 0317 (!) 156/89     Systolic BP Percentile --      Diastolic BP Percentile --      Pulse Rate 02/19/24 0317 65     Resp 02/19/24 0317 18     Temp 02/19/24 0317 98 F (36.7 C)     Temp Source 02/19/24 0317 Oral     SpO2 02/19/24 0317 96 %     Weight --      Height --      Head Circumference --      Peak Flow --      Pain Score 02/19/24 0313 10     Pain Loc --      Pain Education --      Exclude from Growth Chart --     Most recent vital signs: Vitals:   02/19/24 0340 02/19/24 0341  BP: (!) 166/99   Pulse: (!) 58   Resp: 16   Temp:  (!) 97.5 F (36.4 C)  SpO2: 98%     General: Awake, appears to be uncomfortable CV:  Good peripheral perfusion.  Resp:  Normal effort.  Abd:  No distention.  Positive Murphy sign with involuntary guarding on palpation of the right upper quadrant.  Has mild tenderness throughout the entire abdomen as well. Other:  Hypertensive otherwise vital  signs normal, afebrile   ED Results / Procedures / Treatments   Labs (all labs ordered are listed, but only abnormal results are displayed) Labs Reviewed  COMPREHENSIVE METABOLIC PANEL WITH GFR - Abnormal; Notable for the following components:      Result Value   Glucose, Bld 153 (*)    BUN 33 (*)    All other components within normal limits  CBC - Abnormal; Notable for the following components:   WBC 11.7 (*)    nRBC 0.3 (*)    All other components within normal limits  LIPASE, BLOOD  LACTIC ACID, PLASMA  URINALYSIS, ROUTINE W REFLEX MICROSCOPIC  LACTIC ACID, PLASMA     I ordered and reviewed the above labs they are notable for mild leukocytosis  EKG  ED ECG REPORT I, Buell Carmin, the attending physician, personally viewed and interpreted this ECG.   Date: 02/19/2024  EKG Time: 0332  Rate: 62  Rhythm: sinus  Axis: rbbb  Intervals:nl   ST&T Change: no stemi    RADIOLOGY I independently reviewed and interpreted CT of the abdomen pelvis and see dilated bowel loops concerning for  obstructive pathology I also reviewed radiologist's formal read.   PROCEDURES:  Critical Care performed: No  Procedures   MEDICATIONS ORDERED IN ED: Medications  morphine (PF) 4 MG/ML injection 4 mg (4 mg Intravenous Not Given 02/19/24 0330)  cefTRIAXone (ROCEPHIN) 1 g in sodium chloride  0.9 % 100 mL IVPB (has no administration in time range)  metroNIDAZOLE (FLAGYL) IVPB 500 mg (has no administration in time range)  sodium chloride  0.9 % bolus 500 mL (500 mLs Intravenous New Bag/Given 02/19/24 0334)  ondansetron  (ZOFRAN ) injection 4 mg (4 mg Intravenous Given 02/19/24 0333)  acetaminophen  (TYLENOL ) tablet 1,000 mg (1,000 mg Oral Given 02/19/24 0334)  iohexol  (OMNIPAQUE ) 300 MG/ML solution 100 mL (100 mLs Intravenous Contrast Given 02/19/24 0357)    External physician / consultants:  I spoke with hospital medicine for admission and regarding care plan for this patient.   IMPRESSION /  MDM / ASSESSMENT AND PLAN / ED COURSE  I reviewed the triage vital signs and the nursing notes.                                Patient's presentation is most consistent with acute presentation with potential threat to life or bodily function.  Differential diagnosis includes, but is not limited to, cholecystitis/choledocholithiasis/biliary colic, pancreatitis, perforated viscus, gastritis/GERD/ulcer, appendicitis, diverticulitis, gut ischemia   The patient is on the cardiac monitor to evaluate for evidence of arrhythmia and/or significant heart rate changes.  MDM:    He looks quite uncomfortable and his pain is centered in the right upper quadrant with known gallstones and this is postprandial pain associate with nausea I think #1 suspicion is biliary pathologies such as cholecystitis.  Getting a right upper quadrant ultrasound and check LFTs, labs.  He does have diffuse tenderness throughout as well, albeit more mild compared to the right upper quadrant.  Consider appendicitis, diverticulitis, also perforated viscus given his history of gastritis and diffuse tenderness.  Get a CT scan as well (I am fairly certain that the CT scan will show a gallstone as he has a history of this so I think a concurrent right upper quadrant ultrasound is also necessary to further evaluate for more nuanced signs of cholecystitis, biliary duct dilation etc.)  I considered mesenteric ischemia as well but I think this is less likely given higher suspicion for the above etiologies.  He has also no longer in A-fib after ablation, and is compliant with his Eliquis  and so I think these reduce his risk significantly.  I have ordered for IV pain control and antiemetic for this patient but he adamantly refuses narcotics as "I do not want drugs in my body" so will hold these medications per patient request for the time being.  -- CT scan shows small bowel obstruction findings and cholelithiasis without cholecystitis.  I  think his acute symptoms are due to the bowel obstruction rather than the cholelithiasis given the longstanding nature of his known gallstones, no LFT abnormalities, and no sonographic evidence of acute cholecystitis today.  I talked to the patient about NG tube placement in the medical rationale for how this can help with his symptoms and help his healing process however he adamantly declines at this time.  He agrees for admission to the hospital service.  There are some inflammatory changes around the bowels on CT scan and leukocytosis associated saw give him some antibiotics ceftriaxone/Flagyl for intra-abdominal coverage.  Admission.  FINAL CLINICAL IMPRESSION(S) / ED DIAGNOSES   Final diagnoses:  RUQ pain  Nausea  Small bowel obstruction (HCC)  Calculus of gallbladder without cholecystitis without obstruction     Rx / DC Orders   ED Discharge Orders     None        Note:  This document was prepared using Dragon voice recognition software and may include unintentional dictation errors.    Buell Carmin, MD 02/19/24 9147    Buell Carmin, MD 02/19/24 [phone]

## 2024-02-19 NOTE — H&P (Signed)
 History and Physical    EXTON SUHRE ZHY:[id number] DOB: 03-21-1956 DOA: 02/19/2024  PCP: Judithann Novas, MD (Confirm with patient/family/NH records and if not entered, this has to be entered at [facility] point of entry) Patient coming from: Home  I have personally briefly reviewed patient's old medical records in [facility]  Chief Complaint: Feeling better  HPI: Alex Huffman is a 68 y.o. male with medical history significant of PAF on Eliquis , chronic HFpEF, hypothyroidism, presented with persistent abdominal pain nausea vomiting.  Patient reported that he has had intermittent epigastric abdominal pain for the last 2 to 3 years, happens every 2 to 3 months, usually associated with eating certain foods and resolved by itself in the matter of 1 hour or 2.  Last night, after eating lasagna for dinner around 8 he started to have severe pain at 9:00, initially was epigastric located radiating to the right side, soon transitioned to periumbilical area and became constant.  He has had feeling of nausea and vomited 3 times and 1 time of loose diarrhea this morning.  Abdominal pain however did not improve after diarrhea.  This morning, he started to feel better in terms of abdominal pain and request food.  ED Course: Afebrile, borderline bradycardia blood pressure 120/80 O2 saturation 96% on room air.  Blood work showed WBC 11.7 hemoglobin 14.6 sodium 137 potassium 4.0 BUN 33 creatinine 1.0 glucose 154.  Patient was given morphine and Zofran  in the ED.  And started on LR.  Review of Systems: As per HPI otherwise 14 point review of systems negative.    Past Medical History:  Diagnosis Date   Anxiety    Atrial fibrillation (HCC)    Cataract    Chronic headaches    Helicobacter pylori gastritis 2009   EGD   Hyperlipemia    Personal history of colonic polyps     Past Surgical History:  Procedure Laterality Date   CARDIOVERSION N/A 11/09/2020   Procedure: CARDIOVERSION;  Surgeon: Harrold Lincoln, MD;  Location: [facility] ENDOSCOPY;  Service: Cardiovascular;  Laterality: N/A;   COLONOSCOPY  , 06/08/2011   2009: two 3 mm hyperplastic polyps, lipoma, internal hemorrjhoids 2012: 4mm cecal serrated adenoma, lipoma, internal hemorrhoids   HERNIA REPAIR  05/04   ROTATOR CUFF REPAIR  05/06   UPPER GASTROINTESTINAL ENDOSCOPY  02/02/2008   erosive gastropathy     reports that he has never smoked. He has never used smokeless tobacco. He reports that he does not currently use alcohol. He reports that he does not use drugs.  Allergies  Allergen Reactions   Typhoid Vaccine, Live Other (See Comments) and Anaphylaxis   Typhoid Vi Polysaccharide Vaccine Anaphylaxis and Other (See Comments)    Comatose, almost died    Amlodipine Other (See Comments)    Leg pain and fatigue   Atorvastatin  Diarrhea    Body ache   Azithromycin  Other (See Comments)    Rapid Heart Rate/Palpitations   Doxycycline Other (See Comments)    Severe Stomach Issues   Nitrofurantoin Other (See Comments) and Nausea And Vomiting   Sulfa Antibiotics Hives    Family History  Problem Relation Age of Onset   Heart disease Father    Celiac disease Mother    Colon cancer Neg Hx    Esophageal cancer Neg Hx    Liver cancer Neg Hx    Pancreatic cancer Neg Hx    Rectal cancer Neg Hx    Stomach cancer Neg Hx  Allergic rhinitis Neg Hx      Prior to Admission medications   Medication Sig Start Date End Date Taking? Authorizing Provider  apixaban  (ELIQUIS ) 5 MG TABS tablet Take 1 tablet (5 mg total) by mouth 2 (two) times daily. 05/09/21   Nahser, Lela Purple, MD  levothyroxine  (SYNTHROID ) 25 MCG tablet Take 1 tablet by mouth once daily 02/13/24   Judithann Novas, MD    Physical Exam: Vitals:   02/19/24 0712 02/19/24 0721 02/19/24 0759 02/19/24 0815  BP:  111/63 123/82   Pulse: (!) 56 (!) 58 62   Resp: 19 20    Temp:    97.7 F (36.5 C)  TempSrc:    Oral  SpO2: 95% 94% 96%   Weight:   80.4 kg      Constitutional: NAD, calm, comfortable Vitals:   02/19/24 0712 02/19/24 0721 02/19/24 0759 02/19/24 0815  BP:  111/63 123/82   Pulse: (!) 56 (!) 58 62   Resp: 19 20    Temp:    97.7 F (36.5 C)  TempSrc:    Oral  SpO2: 95% 94% 96%   Weight:   80.4 kg    Eyes: PERRL, lids and conjunctivae normal ENMT: Mucous membranes are moist. Posterior pharynx clear of any exudate or lesions.Normal dentition.  Neck: normal, supple, no masses, no thyromegaly Respiratory: clear to auscultation bilaterally, no wheezing, no crackles. Normal respiratory effort. No accessory muscle use.  Cardiovascular: Regular rate and rhythm, no murmurs / rubs / gallops. No extremity edema. 2+ pedal pulses. No carotid bruits.  Abdomen: no tenderness, no masses palpated. No hepatosplenomegaly.  Decreased bowel sounds on all quadrants all quadrants.  Musculoskeletal: no clubbing / cyanosis. No joint deformity upper and lower extremities. Good ROM, no contractures. Normal muscle tone.  Skin: no rashes, lesions, ulcers. No induration Neurologic: CN 2-12 grossly intact. Sensation intact, DTR normal. Strength 5/5 in all 4.  Psychiatric: Normal judgment and insight. Alert and oriented x 3. Normal mood.     Labs on Admission: I have personally reviewed following labs and imaging studies  CBC: Recent Labs  Lab 02/19/24 0328  WBC 11.7*  HGB 14.6  HCT 45.2  MCV 90.4  PLT 203   Basic Metabolic Panel: Recent Labs  Lab 02/19/24 0328  NA 139  K 4.0  CL 105  CO2 24  GLUCOSE 153*  BUN 33*  CREATININE 1.08  CALCIUM  9.4   GFR: Estimated Creatinine Clearance: 63.8 mL/min (by C-G formula based on SCr of 1.08 mg/dL). Liver Function Tests: Recent Labs  Lab 02/19/24 0328  AST 24  ALT 25  ALKPHOS 48  BILITOT 1.0  PROT 7.3  ALBUMIN 4.4   Recent Labs  Lab 02/19/24 0328  LIPASE 27   No results for input(s): "AMMONIA" in the last 168 hours. Coagulation Profile: No results for input(s): "INR", "PROTIME" in  the last 168 hours. Cardiac Enzymes: No results for input(s): "CKTOTAL", "CKMB", "CKMBINDEX", "TROPONINI" in the last 168 hours. BNP (last 3 results) No results for input(s): "PROBNP" in the last 8760 hours. HbA1C: No results for input(s): "HGBA1C" in the last 72 hours. CBG: No results for input(s): "GLUCAP" in the last 168 hours. Lipid Profile: No results for input(s): "CHOL", "HDL", "LDLCALC", "TRIG", "CHOLHDL", "LDLDIRECT" in the last 72 hours. Thyroid  Function Tests: No results for input(s): "TSH", "T4TOTAL", "FREET4", "T3FREE", "THYROIDAB" in the last 72 hours. Anemia Panel: No results for input(s): "VITAMINB12", "FOLATE", "FERRITIN", "TIBC", "IRON", "RETICCTPCT" in the last 72 hours. Urine analysis:  Component Value Date/Time   COLORURINE STRAW (A) 02/19/2024 0328   APPEARANCEUR CLEAR (A) 02/19/2024 0328   APPEARANCEUR Clear 01/09/2024 1420   LABSPEC >1.046 (H) 02/19/2024 0328   PHURINE 6.0 02/19/2024 0328   GLUCOSEU NEGATIVE 02/19/2024 0328   HGBUR NEGATIVE 02/19/2024 0328   BILIRUBINUR NEGATIVE 02/19/2024 0328   BILIRUBINUR Negative 01/09/2024 1420   KETONESUR NEGATIVE 02/19/2024 0328   PROTEINUR NEGATIVE 02/19/2024 0328   UROBILINOGEN 0.2 09/25/2022 1019   NITRITE NEGATIVE 02/19/2024 0328   LEUKOCYTESUR NEGATIVE 02/19/2024 0328    Radiological Exams on Admission: US  ABDOMEN LIMITED RUQ (LIVER/GB) Result Date: 02/19/2024 CLINICAL DATA:  68 year old male with right abdominal pain since 2000 hours, known gallstones. EXAM: ULTRASOUND ABDOMEN LIMITED RIGHT UPPER QUADRANT COMPARISON:  CT Abdomen and Pelvis 0403 hours today. FINDINGS: Gallbladder: Redemonstrated 8 mm gallstone in the gallbladder neck (image 2). No sonographic Murphy sign elicited. No pericholecystic fluid. Gallbladder wall thickness is at the upper limits of normal. Common bile duct: Diameter: 4-5 mm, normal. Liver: No focal lesion identified. Within normal limits in parenchymal echogenicity. Portal vein is  patent on color Doppler imaging with normal direction of blood flow towards the liver. Other: No free fluid.  Negative visible right kidney. IMPRESSION: 1. Cholelithiasis without strong evidence of acute cholecystitis. 2. Negative ultrasound appearance of the Liver and bile ducts. Electronically Signed   By: Marlise Simpers M.D.   On: 02/19/2024 05:27   CT ABDOMEN PELVIS W CONTRAST Result Date: 02/19/2024 CLINICAL DATA:  68 year old male with right abdominal pain since 2000 hours. Known gallstones. EXAM: CT ABDOMEN AND PELVIS WITH CONTRAST TECHNIQUE: Multidetector CT imaging of the abdomen and pelvis was performed using the standard protocol following bolus administration of intravenous contrast. RADIATION DOSE REDUCTION: This exam was performed according to the departmental dose-optimization program which includes automated exposure control, adjustment of the mA and/or kV according to patient size and/or use of iterative reconstruction technique. CONTRAST:  OMNIPAQUE  IOHEXOL  300 MG/ML  SOLN COMPARISON:  CT Abdomen and Pelvis 09/28/2022. FINDINGS: Lower chest: Calcified coronary artery atherosclerosis series 2, image 1. Normal heart size. No pericardial or pleural effusion. Lung bases appears stable, negative aside from mild symmetric dependent atelectasis, mild lung base scarring. Hepatobiliary: 7 mm gallstone within the gallbladder neck on series 2, image 33, but appears similar to the previous CT and the gallbladder is nondistended. No pericholecystic inflammation. Liver enhancement remains normal. No bile duct dilatation. Pancreas: Negative. Spleen: Negative. Adrenals/Urinary Tract: Normal adrenal glands. Nonobstructed kidneys with benign renal parapelvic cysts. Symmetric renal enhancement and contrast excretion. There is a left lower pole renal calculus measuring 5-6 mm which is chronic. Diminutive ureters. Bladder is within normal limits. Occasional pelvic phleboliths. Stomach/Bowel: Decompressed large bowel  throughout the abdomen and pelvis. Diverticulosis in the descending and sigmoid segment. There is mild retained stool in the sigmoid. Small volume of fluid in the cecum. The appendix remains normal on coronal image 43. Terminal ileum is decompressed amidst fluid-filled dilated small bowel in the right lower quadrant (TI suspected on series 2, image 65). Upstream of the TI there is a relative transition point in the right lower quadrant on coronal image 28 and series 2, image 67. Upstream of this small bowel becomes dilated with flocculated contents. And upstream dilated and/or thick-walled small bowel loops containing fluid continues throughout the right abdomen with associated edema or stranding in the small bowel mesentery (coronal image 41). Similar fluid containing dilated small bowel tracks across midline into the left abdomen and then slowly  tapers proximally. Decompressed and nondilated jejunum. But the stomach is dilated and fluid-filled. The duodenum is decompressed. There are nearby dilated right abdominal small bowel loops inferolateral to the duodenum. No pneumoperitoneum.  No free fluid identified in the abdomen. Vascular/Lymphatic: Major arterial structures in the abdomen and pelvis remain patent. Mild for age Aortoiliac calcified atherosclerosis. Substantially more normal appearance of the celiac artery trunk today (series 2, image 30) compared to the 2023 CT where large vessel vasculitis there was suspected. Portal venous system also appears patent. No lymphadenopathy identified. Reproductive: Prostatomegaly. Grossly stable prostate heterogeneity. Other: Small volume of free fluid in the pelvis with simple fluid density (series 2, image 82). Musculoskeletal: No acute osseous abnormality identified. Mild for age degeneration in the visible spine. IMPRESSION: 1. Positive for abnormal small bowel throughout the right abdomen: Gradually dilated and fluid-filled and/or thick-walled, with abrupt transition  point just upstream of the terminal ileum. Inflammation in the affected small bowel mesentery. Trace free fluid in the pelvis. No pneumoperitoneum. Favor Acute Small Bowel Obstruction at the TI, no obstructing etiology is evident. The distribution of loops argues against an internal hernia or closed loop obstruction. 2. Dilated and fluid-filled Stomach also, the duodenum is decompressed. Consider NG tube decompression. 3. Large bowel is decompressed and the appendix is normal. 4. Cholelithiasis without evidence of acute cholecystitis. 5. Left nephrolithiasis. Prostatomegaly. Aortic Atherosclerosis (ICD10-I70.0), with resolved inflammation of the celiac artery trunk since 2023. Electronically Signed   By: Marlise Simpers M.D.   On: 02/19/2024 04:38    EKG: Independently reviewed.  Sinus rhythm with chronic RBBB, frequent PVCs  Assessment/Plan Principal Problem:   Small bowel obstruction (HCC) Active Problems:   SBO (small bowel obstruction) (HCC)  (please populate well all problems here in Problem List. (For example, if patient is on BP meds at home and you resume or decide to hold them, it is a problem that needs to be her. Same for CAD, COPD, HLD and so on)  SBO - Unknown etiology, as patient does not have a previous intra-abdominal surgery history and CT abdominal pelvis showed transition point of bowel obstruction at end ileum but no signs of hernia or adhesion identified. - Patient however declined offer of NGT. - Consult general surgeon - Discussed with patient regarding the strategy of above resting for at least a day. - Continue IV fluid and as needed Zofran  and Dilaudid  PAF - In sinus rhythm - Hold off Eliquis  in case patient goes to OR - Chemical DVT prophylaxis  Hypothyroidism - Resume Synthroid  once able to take p.o.  DVT prophylaxis: Lovenox Code Status: Full Family Communication: Wife at bedside Disposition Plan: Patient is sick with new onset of SBO, requiring close clinical  monitoring and inpatient surgical consultation, expect more than 2 midnight hospital stay Consults called: General surgeon Admission status: MedSurg admission   [place] MD [facility] Pager [phone]  02/19/2024, 8:47 AM

## 2024-02-19 NOTE — ED Notes (Signed)
 Hospitalist at bedside

## 2024-02-19 NOTE — ED Triage Notes (Signed)
 Pt to ED form home with RUQ abdominal pain since 2000 w/ NV/D. Pt states he has a hx of gallstones that felt similar in presentation. Denies CP, SOB, urinary symptoms, bloody stool.

## 2024-02-20 DIAGNOSIS — K56609 Unspecified intestinal obstruction, unspecified as to partial versus complete obstruction: Secondary | ICD-10-CM | POA: Diagnosis not present

## 2024-02-20 LAB — BASIC METABOLIC PANEL WITH GFR
Anion gap: 5 (ref 5–15)
BUN: 20 mg/dL (ref 8–23)
CO2: 27 mmol/L (ref 22–32)
Calcium: 8.3 mg/dL — ABNORMAL LOW (ref 8.9–10.3)
Chloride: 108 mmol/L (ref 98–111)
Creatinine, Ser: 1.02 mg/dL (ref 0.61–1.24)
GFR, Estimated: >60 mL/min (ref >60)
Glucose, Bld: 101 mg/dL — ABNORMAL HIGH (ref 70–99)
Potassium: 4.1 mmol/L (ref 3.5–5.1)
Sodium: 140 mmol/L (ref 135–145)

## 2024-02-20 LAB — CBC
HCT: 36.3 % — ABNORMAL LOW (ref 39.0–52.0)
Hemoglobin: 11.7 g/dL — ABNORMAL LOW (ref 13.0–17.0)
MCH: 29.3 pg (ref 26.0–34.0)
MCHC: 32.2 g/dL (ref 30.0–36.0)
MCV: 91 fL (ref 80.0–100.0)
Platelets: 164 10*3/uL (ref 150–400)
RBC: 3.99 MIL/uL — ABNORMAL LOW (ref 4.22–5.81)
RDW: 12.8 % (ref 11.5–15.5)
WBC: 5.3 10*3/uL (ref 4.0–10.5)
nRBC: 0 % (ref 0.0–0.2)

## 2024-02-20 MED ORDER — PANTOPRAZOLE SODIUM 40 MG PO TBEC: 40.0000 mg | DELAYED_RELEASE_TABLET | Freq: Every day | ORAL | 1 refills | Status: AC

## 2024-02-20 MED ORDER — PROMETHAZINE HCL 25 MG PO TABS: 12.5000 mg | ORAL_TABLET | Freq: Three times a day (TID) | ORAL | 2 refills | Status: AC | PRN

## 2024-02-20 NOTE — Discharge Instructions (Signed)
 CONSTIPATION:  ALL THE TIME:  Lifestyle measures Hydration: drink plenty of water Physical activity: at least walking daily High-fiber foods Bulk forming laxatives  Psyllium (Konsyl; Metamucil; Perdiem) Methylcellulose (Citrucel) Calcium polycarbophil (FiberCon; Fiber-Lax; Mitrolan) Wheat dextrin (Benefiber)  AS NEEDED FOR 3-5 DAYS AT A TIME OR ALL THE TIME 1-2 TIMES PER WEEK  (CAN TAKE ONE FROM EACH CATEGORY E.G. MIRALAX + SENNA) Hyperosmolar or saline laxatives: Polyethylene glycol (MiraLAX, GlycoLax) Lactulose Sorbitol Magnesium hydroxide (Milk of Magnesia)  Magnesium citrate (Evac-Q-Mag)  Stimulant laxatives Senna (eg, Black Draught, Ex-Lax, Fletcher's, Castoria, Senokot)  Bisacodyl (eg, Correctol, Doxidan, Dulcolax). Taking stimulant laxatives regularly or in large amounts can cause side effects, including low potassium levels. Thus, you should take these drugs carefully if you must use them regularly.

## 2024-02-20 NOTE — Care Management CC44 (Signed)
 Condition Code 44 Documentation Completed  Patient Details  Name: Alex Huffman MRN: [id number] Date of Birth: 01/06/1956   Condition Code 44 given:  Yes Patient signature on Condition Code 44 notice:  Yes Documentation of 2 MD's agreement:  Yes Code 44 added to claim:  Yes    Odilia Bennett, LCSW 02/20/2024, 10:00 AM

## 2024-02-20 NOTE — TOC CM/SW Note (Signed)
 Transition of Care [facility]) - Inpatient Brief Assessment   Patient Details  Name: Alex Huffman MRN: [id number] Date of Birth: [birth year]  [facility] of [facility]) CM/SW Contact:    Odilia Bennett, LCSW Phone Number: 02/20/2024, 10:02 AM   Clinical Narrative: Patient has orders to discharge home today. CSW reviewed chart. No TOC needs identified. His neighbor will pick him up. CSW signing off.  Transition of Care Asessment: Insurance and Status: Insurance coverage has been reviewed Patient has primary care physician: Yes Home environment has been reviewed: Single family home Prior level of function:: Independent Prior/Current Home Services: No current home services Social Drivers of Health Review: SDOH reviewed no interventions necessary Readmission risk has been reviewed: Yes Transition of care needs: no transition of care needs at this time

## 2024-02-20 NOTE — Care Management Obs Status (Signed)
 MEDICARE OBSERVATION STATUS NOTIFICATION   Patient Details  Name: Alex Huffman MRN: [id number] Date of Birth: 1956-09-26   Medicare Observation Status Notification Given:  Yes    Odilia Bennett, LCSW 02/20/2024, 9:59 AM

## 2024-02-20 NOTE — Progress Notes (Addendum)
 [facility] SURGICAL PROGRESS NOTE (cpt [phone])  Hospital Day(s): 1.   Interval History: Patient seen and examined, no acute events or new complaints overnight. Patient reports he is feeling good this AM. No significant abdominal pain. No fever, chills, nausea, emesis. Leukocytosis resolved; WBC 5.3K. Hgb to 11.7. Renal function remains normal; sCr - 1.02; UO - unmeasured. No electrolyte derangements. FLD overnight; tolerated without issue. He is passing flatus.   Review of Systems:  Constitutional: denies fever, chills  HEENT: denies cough or congestion  Respiratory: denies any shortness of breath  Cardiovascular: denies chest pain or palpitations  Gastrointestinal: denies abdominal pain, N/V Genitourinary: denies burning with urination or urinary frequency Musculoskeletal: denies pain, decreased motor or sensation  Vital signs in last 24 hours: [min-max] current  Temp:  [97.5 F (36.4 C)-98 F (36.7 C)] 98 F (36.7 C) (05/15 0337) Pulse Rate:  [49-63] 51 (05/15 0337) Resp:  [16-20] 18 (05/15 0337) BP: (111-155)/(63-91) 115/82 (05/15 0337) SpO2:  [94 %-100 %] 95 % (05/15 0337) Weight:  [80.4 kg] 80.4 kg (05/14 0759)     Height: 5\' 7"  (170.2 cm) Weight: 80.4 kg     Intake/Output last 2 shifts:  No intake/output data recorded.   Physical Exam:  Constitutional: alert, cooperative and no distress  HENT: normocephalic without obvious abnormality  Eyes: PERRL, EOM's grossly intact and symmetric  Respiratory: breathing non-labored at rest  Cardiovascular: regular rate and sinus rhythm  Gastrointestinal: soft, non-tender, and non-distended Musculoskeletal: no edema or wounds, motor and sensation grossly intact, NT    Labs:     Latest Ref Rng & Units 02/20/2024    4:56 AM 02/19/2024    3:28 AM 01/23/2024    9:18 AM  CBC  WBC 4.0 - 10.5 K/uL 5.3  11.7  7.6   Hemoglobin 13.0 - 17.0 g/dL 60.4  54.0  98.1   Hematocrit 39.0 - 52.0 % 36.3  45.2  42.9   Platelets 150 -  400 K/uL 164  203  245.0       Latest Ref Rng & Units 02/20/2024    4:56 AM 02/19/2024    3:28 AM 01/23/2024    9:27 AM  CMP  Glucose 70 - 99 mg/dL 191  478  94   BUN 8 - 23 mg/dL 20  33  18   Creatinine 0.61 - 1.24 mg/dL 2.95  6.21  3.08   Sodium 135 - 145 mmol/L 140  139  143   Potassium 3.5 - 5.1 mmol/L 4.1  4.0  4.7   Chloride 98 - 111 mmol/L 108  105  105   CO2 22 - 32 mmol/L 27  24  28    Calcium  8.9 - 10.3 mg/dL 8.3  9.4  9.5   Total Protein 6.5 - 8.1 g/dL  7.3  6.9   Total Bilirubin 0.0 - 1.2 mg/dL  1.0  1.0   Alkaline Phos 38 - 126 U/L  48  64   AST 15 - 41 U/L  24  19   ALT 0 - 44 U/L  25  20      Imaging studies: No new pertinent imaging studies   Assessment/Plan: (ICD-10's: K66.609) 68 y.o. male presenting with, now improved, abdominal pain, nausea, and emesis concerning for possible SBO, also consider enteritis, complicated by pertinent comorbidities including history of atrial fibrillation.   - Again, overall picture is not clear cut. Favor enteritis given thickening of the bowel seen on CT, accompanying N/V/D, and rapid improvement in  symptoms. SBO seems less likely given "virgin" abdomen; however, appearance on presenting CT is certainly concerning. Fortunately, he has clinically improved and tolerated diet advancement. We may consider reassessment as outpatient (ie; CT enterography).   - Okay to advance diet as tolerated   - I do not think he needs Abx             - No need for emergent surgical intervention.             - Monitor abdominal examination; on-going bowel function             - Pain control prn; antiemetics prn - Mobilize              - Okay to resume home medications, Eliquis     - Discharge Planning: Doing well with ROBF, tolerating diet. He is okay for discharge from surgical perspective. Reviewed signs and symptoms to warrant evaluation/concern. We will plan to follow up with him on 05/29 to reassess and consider additional work up  All of the  above findings and recommendations were discussed with the patient, and the medical team, and all of patient's questions were answered to his expressed satisfaction.  -- Apolonio Bay, PA-C [facility] 02/20/2024, 7:17 AM M-F: 7am - 4pm

## 2024-02-20 NOTE — Plan of Care (Signed)

## 2024-02-20 NOTE — Discharge Summary (Signed)
 Physician Discharge Summary   Patient: Alex Huffman MRN: [id number]  DOB: 1956-05-01   Admit:     Date of Admission: 02/19/2024 Admitted from: home   Discharge: Date of discharge: 02/20/24 Disposition: Home Condition at discharge: good  CODE STATUS: FULL CODE     Discharge Physician: Melodi Sprung, DO [facility]     PCP: Judithann Novas, MD  Recommendations for Outpatient Follow-up:  Follow up with PCP Cherlyn Cornet, Amy E, MD in 1-2 weeks - consider f/u w/ general surgery and/or GI     Discharge Instructions     Increase activity slowly   Complete by: As directed          Discharge Diagnoses: Principal Problem:   Small bowel obstruction (HCC) Active Problems:   SBO (small bowel obstruction) [facility])     Hospital course / significant events:   68 y.o. male presented to [facility] ED overnight for evaluation of abdominal pain. Patient reports the acute onset of primarily right sided abdominal pain. This was worst in RLQ. This was accompanied by nausea and emesis. No fever, chills, cough, CP, SOB, urinary changes. He denied any sick contacts, recent travel, o abnormal food exposures. No history of obstruction previously. Only previous abdominal surgery is open right inguinal hernia repair. He is on Eliquis  secondary to a history of Atrial fibrillation also s/p ablation. Work up in the ED revealed a mild leukocytosis to 11.7K, Hgb to 14.6, renal function normal with sCr - 1.08, no electrolyte derangements, and venous lactate normal x2. CT Abdomen/Pelvis was obtained and concerning for small bowel obstruction. NGT was recommended but patient declined this. Admitted w/ concern for SBO. XR follow through reassuring and (+)flatus, resolution of pain by following morning 05/15. Pt reports some abd pain following po intake, no vomiting, pain resolved. Suspect gastroenteritis / gastritis, possible GB colic / stone at GB neck may consider elective cholecystectomy, initiate  PPI and f/u w/ PCP consider surgical follow up re: GB and/or GI follow up re: gastritis      Consultants:  General surgery   Procedures/Surgeries: none      ASSESSMENT & PLAN:   Abd pain question gastritis/gastroenteritis, pain from peristalsis against intraluminal gas, biliary colic but have r/o cholecystitis / ductal dilatation initiate PPI x4-6 week course  Antiemetics as needed Bland diet, avoid high fat foods  f/u w/ PCP  consider surgical follow up re: GB  Consider GI follow up re: gastritis  Clear to discharge per general surgery   Paroxysmal afib Continue eliquis   Hypothyroid Continue synthroid     overweight based on BMI: Body mass index is 27.76 kg/m.Aaron Aas Significantly low or high BMI is associated with higher medical risk.  Underweight - under 18  overweight - 25 to 29 obese - 30 or more Class 1 obesity: BMI of 30.0 to 34 Class 2 obesity: BMI of 35.0 to 39 Class 3 obesity: BMI of 40.0 to 49 Super Morbid Obesity: BMI 50-59 Super-super Morbid Obesity: BMI 60+ Healthy nutrition and physical activity advised as adjunct to other disease management and risk reduction         Discharge Instructions  Allergies as of 02/20/2024       Reactions   Typhoid Vaccine, Live Other (See Comments), Anaphylaxis   Typhoid Vi Polysaccharide Vaccine Anaphylaxis, Other (See Comments)   Comatose, almost died   Amlodipine Other (See Comments)   Leg pain and fatigue   Atorvastatin  Diarrhea   Body ache   Azithromycin  Other (See Comments)  Rapid Heart Rate/Palpitations   Doxycycline Other (See Comments)   Severe Stomach Issues   Nitrofurantoin Other (See Comments), Nausea And Vomiting   Sulfa Antibiotics Hives        Medication List     TAKE these medications    apixaban  5 MG Tabs tablet Commonly known as: ELIQUIS  Take 1 tablet (5 mg total) by mouth 2 (two) times daily.   levothyroxine  25 MCG tablet Commonly known as: SYNTHROID  Take 1 tablet by mouth  once daily   pantoprazole  40 MG tablet Commonly known as: Protonix  Take 1 tablet (40 mg total) by mouth daily.   promethazine 25 MG tablet Commonly known as: PHENERGAN Take 0.5-1 tablets (12.5-25 mg total) by mouth every 8 (eight) hours as needed for nausea or vomiting.         Follow-up Information     Flynn Hylan, MD. Go on 03/05/2024.   Specialty: General Surgery Why: Go to appointment on 05/29 at 900 AM Contact information: [address] [place] [address] [phone]                 Allergies  Allergen Reactions   Typhoid Vaccine, Live Other (See Comments) and Anaphylaxis   Typhoid Vi Polysaccharide Vaccine Anaphylaxis and Other (See Comments)    Comatose, almost died    Amlodipine Other (See Comments)    Leg pain and fatigue   Atorvastatin  Diarrhea    Body ache   Azithromycin  Other (See Comments)    Rapid Heart Rate/Palpitations   Doxycycline Other (See Comments)    Severe Stomach Issues   Nitrofurantoin Other (See Comments) and Nausea And Vomiting   Sulfa Antibiotics Hives     Subjective: pt states no vomiting today, passing flatus though no BM, pain improved, still some discomfort after eating this morning but also resolved. Requesting for discharge to attend family wedding.    Discharge Exam: BP 119/72 (BP Location: Right Arm)   Pulse (!) 49   Temp 98 F (36.7 C) (Oral)   Resp 16   Ht 5\' 7"  (1.702 m)   Wt 80.4 kg   SpO2 96%   BMI 27.76 kg/m  General: Pt is alert, awake, not in acute distress Cardiovascular: RRR, S1/S2 +, no rubs, no gallops Respiratory: CTA bilaterally, no wheezing, no rhonchi Abdominal: Soft, NT, ND, bowel sounds + Extremities: no edema, no cyanosis     The results of significant diagnostics from this hospitalization (including imaging, microbiology, ancillary and laboratory) are listed below for reference.     Microbiology: No results found for this or any previous visit (from the past 240  hours).   Labs: BNP (last 3 results) No results for input(s): "BNP" in the last 8760 hours. Basic Metabolic Panel: Recent Labs  Lab 02/19/24 0328 02/20/24 0456  NA 139 140  K 4.0 4.1  CL 105 108  CO2 24 27  GLUCOSE 153* 101*  BUN 33* 20  CREATININE 1.08 1.02  CALCIUM  9.4 8.3*   Liver Function Tests: Recent Labs  Lab 02/19/24 0328  AST 24  ALT 25  ALKPHOS 48  BILITOT 1.0  PROT 7.3  ALBUMIN 4.4   Recent Labs  Lab 02/19/24 0328  LIPASE 27   No results for input(s): "AMMONIA" in the last 168 hours. CBC: Recent Labs  Lab 02/19/24 0328 02/20/24 0456  WBC 11.7* 5.3  HGB 14.6 11.7*  HCT 45.2 36.3*  MCV 90.4 91.0  PLT 203 164   Cardiac Enzymes: No results for input(s): "CKTOTAL", "  CKMB", "CKMBINDEX", "TROPONINI" in the last 168 hours. BNP: Invalid input(s): "POCBNP" CBG: No results for input(s): "GLUCAP" in the last 168 hours. D-Dimer No results for input(s): "DDIMER" in the last 72 hours. Hgb A1c No results for input(s): "HGBA1C" in the last 72 hours. Lipid Profile No results for input(s): "CHOL", "HDL", "LDLCALC", "TRIG", "CHOLHDL", "LDLDIRECT" in the last 72 hours. Thyroid  function studies No results for input(s): "TSH", "T4TOTAL", "T3FREE", "THYROIDAB" in the last 72 hours.  Invalid input(s): "FREET3" Anemia work up No results for input(s): "VITAMINB12", "FOLATE", "FERRITIN", "TIBC", "IRON", "RETICCTPCT" in the last 72 hours. Urinalysis    Component Value Date/Time   COLORURINE STRAW (A) 02/19/2024 0328   APPEARANCEUR CLEAR (A) 02/19/2024 0328   APPEARANCEUR Clear 01/09/2024 1420   LABSPEC >1.046 (H) 02/19/2024 0328   PHURINE 6.0 02/19/2024 0328   GLUCOSEU NEGATIVE 02/19/2024 0328   HGBUR NEGATIVE 02/19/2024 0328   BILIRUBINUR NEGATIVE 02/19/2024 0328   BILIRUBINUR Negative 01/09/2024 1420   KETONESUR NEGATIVE 02/19/2024 0328   PROTEINUR NEGATIVE 02/19/2024 0328   UROBILINOGEN 0.2 09/25/2022 1019   NITRITE NEGATIVE 02/19/2024 0328    LEUKOCYTESUR NEGATIVE 02/19/2024 0328   Sepsis Labs Recent Labs  Lab 02/19/24 0328 02/20/24 0456  WBC 11.7* 5.3   Microbiology No results found for this or any previous visit (from the past 240 hours). Imaging DG Abd 2 Views Result Date: 02/19/2024 CLINICAL DATA:  Small bowel obstruction. EXAM: ABDOMEN - 2 VIEW COMPARISON:  CT abdomen pelvis dated 02/19/2024. FINDINGS: Moderate stool throughout the colon. No bowel dilatation or evidence of obstruction. No free air. Excreted contrast noted in the renal collecting systems and bladder. No acute osseous pathology. IMPRESSION: Nonobstructive bowel gas pattern. Electronically Signed   By: Angus Bark M.D.   On: 02/19/2024 10:17   US  ABDOMEN LIMITED RUQ (LIVER/GB) Result Date: 02/19/2024 CLINICAL DATA:  68 year old male with right abdominal pain since 2000 hours, known gallstones. EXAM: ULTRASOUND ABDOMEN LIMITED RIGHT UPPER QUADRANT COMPARISON:  CT Abdomen and Pelvis 0403 hours today. FINDINGS: Gallbladder: Redemonstrated 8 mm gallstone in the gallbladder neck (image 2). No sonographic Murphy sign elicited. No pericholecystic fluid. Gallbladder wall thickness is at the upper limits of normal. Common bile duct: Diameter: 4-5 mm, normal. Liver: No focal lesion identified. Within normal limits in parenchymal echogenicity. Portal vein is patent on color Doppler imaging with normal direction of blood flow towards the liver. Other: No free fluid.  Negative visible right kidney. IMPRESSION: 1. Cholelithiasis without strong evidence of acute cholecystitis. 2. Negative ultrasound appearance of the Liver and bile ducts. Electronically Signed   By: Marlise Simpers M.D.   On: 02/19/2024 05:27   CT ABDOMEN PELVIS W CONTRAST Result Date: 02/19/2024 CLINICAL DATA:  68 year old male with right abdominal pain since 2000 hours. Known gallstones. EXAM: CT ABDOMEN AND PELVIS WITH CONTRAST TECHNIQUE: Multidetector CT imaging of the abdomen and pelvis was performed using the  standard protocol following bolus administration of intravenous contrast. RADIATION DOSE REDUCTION: This exam was performed according to the departmental dose-optimization program which includes automated exposure control, adjustment of the mA and/or kV according to patient size and/or use of iterative reconstruction technique. CONTRAST:  OMNIPAQUE  IOHEXOL  300 MG/ML  SOLN COMPARISON:  CT Abdomen and Pelvis 09/28/2022. FINDINGS: Lower chest: Calcified coronary artery atherosclerosis series 2, image 1. Normal heart size. No pericardial or pleural effusion. Lung bases appears stable, negative aside from mild symmetric dependent atelectasis, mild lung base scarring. Hepatobiliary: 7 mm gallstone within the gallbladder neck on series 2, image  33, but appears similar to the previous CT and the gallbladder is nondistended. No pericholecystic inflammation. Liver enhancement remains normal. No bile duct dilatation. Pancreas: Negative. Spleen: Negative. Adrenals/Urinary Tract: Normal adrenal glands. Nonobstructed kidneys with benign renal parapelvic cysts. Symmetric renal enhancement and contrast excretion. There is a left lower pole renal calculus measuring 5-6 mm which is chronic. Diminutive ureters. Bladder is within normal limits. Occasional pelvic phleboliths. Stomach/Bowel: Decompressed large bowel throughout the abdomen and pelvis. Diverticulosis in the descending and sigmoid segment. There is mild retained stool in the sigmoid. Small volume of fluid in the cecum. The appendix remains normal on coronal image 43. Terminal ileum is decompressed amidst fluid-filled dilated small bowel in the right lower quadrant (TI suspected on series 2, image 65). Upstream of the TI there is a relative transition point in the right lower quadrant on coronal image 28 and series 2, image 67. Upstream of this small bowel becomes dilated with flocculated contents. And upstream dilated and/or thick-walled small bowel loops containing  fluid continues throughout the right abdomen with associated edema or stranding in the small bowel mesentery (coronal image 41). Similar fluid containing dilated small bowel tracks across midline into the left abdomen and then slowly tapers proximally. Decompressed and nondilated jejunum. But the stomach is dilated and fluid-filled. The duodenum is decompressed. There are nearby dilated right abdominal small bowel loops inferolateral to the duodenum. No pneumoperitoneum.  No free fluid identified in the abdomen. Vascular/Lymphatic: Major arterial structures in the abdomen and pelvis remain patent. Mild for age Aortoiliac calcified atherosclerosis. Substantially more normal appearance of the celiac artery trunk today (series 2, image 30) compared to the 2023 CT where large vessel vasculitis there was suspected. Portal venous system also appears patent. No lymphadenopathy identified. Reproductive: Prostatomegaly. Grossly stable prostate heterogeneity. Other: Small volume of free fluid in the pelvis with simple fluid density (series 2, image 82). Musculoskeletal: No acute osseous abnormality identified. Mild for age degeneration in the visible spine. IMPRESSION: 1. Positive for abnormal small bowel throughout the right abdomen: Gradually dilated and fluid-filled and/or thick-walled, with abrupt transition point just upstream of the terminal ileum. Inflammation in the affected small bowel mesentery. Trace free fluid in the pelvis. No pneumoperitoneum. Favor Acute Small Bowel Obstruction at the TI, no obstructing etiology is evident. The distribution of loops argues against an internal hernia or closed loop obstruction. 2. Dilated and fluid-filled Stomach also, the duodenum is decompressed. Consider NG tube decompression. 3. Large bowel is decompressed and the appendix is normal. 4. Cholelithiasis without evidence of acute cholecystitis. 5. Left nephrolithiasis. Prostatomegaly. Aortic Atherosclerosis (ICD10-I70.0), with  resolved inflammation of the celiac artery trunk since 2023. Electronically Signed   By: Marlise Simpers M.D.   On: 02/19/2024 04:38      Time coordinating discharge: over 30 minutes  SIGNED:  Nathan Stallworth DO [facility]

## 2024-02-26 ENCOUNTER — Telehealth: Payer: Self-pay

## 2024-02-26 ENCOUNTER — Ambulatory Visit
Admission: RE | Admit: 2024-02-26 | Discharge: 2024-02-26 | Disposition: A | Discharge status: Home / Self Care | Attending: Physician Assistant | Admitting: Physician Assistant

## 2024-02-26 ENCOUNTER — Encounter: Payer: Self-pay | Admitting: Physician Assistant

## 2024-02-26 ENCOUNTER — Ambulatory Visit
Admission: RE | Admit: 2024-02-26 | Discharge: 2024-02-26 | Disposition: A | Discharge status: Home / Self Care | Source: Ambulatory Visit | Attending: Physician Assistant | Admitting: Physician Assistant

## 2024-02-26 ENCOUNTER — Other Ambulatory Visit: Admitting: Urology

## 2024-02-26 ENCOUNTER — Ambulatory Visit (INDEPENDENT_AMBULATORY_CARE_PROVIDER_SITE_OTHER): Admitting: Physician Assistant

## 2024-02-26 ENCOUNTER — Other Ambulatory Visit: Payer: Self-pay | Admitting: Physician Assistant

## 2024-02-26 VITALS — BP 141/89 | HR 55 | Temp 98.3°F | Ht 67.0 in | Wt 171.2 lb

## 2024-02-26 DIAGNOSIS — K56609 Unspecified intestinal obstruction, unspecified as to partial versus complete obstruction: Secondary | ICD-10-CM

## 2024-02-26 DIAGNOSIS — K529 Noninfective gastroenteritis and colitis, unspecified: Secondary | ICD-10-CM

## 2024-02-26 DIAGNOSIS — K8020 Calculus of gallbladder without cholecystitis without obstruction: Secondary | ICD-10-CM

## 2024-02-26 NOTE — Progress Notes (Signed)
 [facility] NOTE  02/26/2024  History of Present Illness: Alex Huffman is a 68 y.o. male known to our service following admission from 05/14-05/15 for possible SBO vs enteritis. He has done well since being discharged. He was able to make it to [place]  for his family wedding without incident. He is following soft/mushy diet as instructed. He reports no further abdominal pain, nausea, nor emesis. He is passing flatus. He is having bowel movements but reports these are smaller in caliber and volume than previously. Does not feel that he has gone back to his "regular" yet. Additionally, I did reviewed finding of cholelithiasis. He reports being aware of this for some time. During our discussion, he did report that he has noticed epigastric and RUQ discomfort periodically over the last few years. This can be post-prandial in nature. No fever, chills, juandice. Only previous abdominal surgery is right inguinal hernia repair. He is on Eliquis  secondary to atrial fibrillation. Otherwise, no further complaints   Past Medical History: Past Medical History:  Diagnosis Date   Anxiety    Atrial fibrillation (HCC)    Cataract    Chronic headaches    Helicobacter pylori gastritis 2009   EGD   Hyperlipemia    Personal history of colonic polyps      Past Surgical History: Past Surgical History:  Procedure Laterality Date   CARDIOVERSION N/A 11/09/2020   Procedure: CARDIOVERSION;  Surgeon: Harrold Lincoln, MD;  Location: [facility] ENDOSCOPY;  Service: Cardiovascular;  Laterality: N/A;   COLONOSCOPY  , 06/08/2011   2009: two 3 mm hyperplastic polyps, lipoma, internal hemorrjhoids 2012: 4mm cecal serrated adenoma, lipoma, internal hemorrhoids   HERNIA REPAIR  05/04   ROTATOR CUFF REPAIR  05/06   UPPER GASTROINTESTINAL ENDOSCOPY  02/02/2008   erosive gastropathy    Home Medications: Prior to Admission medications   Medication Sig Start Date End Date Taking? Authorizing  Provider  apixaban  (ELIQUIS ) 5 MG TABS tablet Take 1 tablet (5 mg total) by mouth 2 (two) times daily. 05/09/21   Nahser, Lela Purple, MD  levothyroxine  (SYNTHROID ) 25 MCG tablet Take 1 tablet by mouth once daily 02/13/24   Bedsole, Amy E, MD  pantoprazole  (PROTONIX ) 40 MG tablet Take 1 tablet (40 mg total) by mouth daily. 02/20/24   Alexander, Natalie, DO  promethazine  (PHENERGAN ) 25 MG tablet Take 0.5-1 tablets (12.5-25 mg total) by mouth every 8 (eight) hours as needed for nausea or vomiting. 02/20/24   Alexander, Natalie, DO    Allergies: Allergies  Allergen Reactions   Typhoid Vaccine, Live Other (See Comments) and Anaphylaxis   Typhoid Vi Polysaccharide Vaccine Anaphylaxis and Other (See Comments)    Comatose, almost died    Amlodipine Other (See Comments)    Leg pain and fatigue   Atorvastatin  Diarrhea    Body ache   Azithromycin  Other (See Comments)    Rapid Heart Rate/Palpitations   Doxycycline Other (See Comments)    Severe Stomach Issues   Nitrofurantoin Other (See Comments) and Nausea And Vomiting   Sulfa Antibiotics Hives    Review of Systems: Review of Systems  Constitutional:  Negative for chills and fever.  Respiratory:  Negative for cough and shortness of breath.   Cardiovascular:  Negative for chest pain and palpitations.  Gastrointestinal:  Negative for abdominal pain, blood in stool, diarrhea, nausea and vomiting.  Genitourinary:  Negative for dysuria and urgency.  All other systems reviewed and are negative.   Physical Exam BP (!) 141/89  Pulse (!) 55   Temp 98.3 F (36.8 C) (Oral)   Ht 5\' 7"  (1.702 m)   Wt 171 lb 3.2 oz (77.7 kg)   SpO2 96%   BMI 26.81 kg/m   Physical Exam Vitals and nursing note reviewed. Exam conducted with a chaperone present.  Constitutional:      General: He is not in acute distress.    Appearance: Normal appearance. He is normal weight. He is not ill-appearing.  HENT:     Head: Normocephalic and atraumatic.  Eyes:      General: No scleral icterus.    Conjunctiva/sclera: Conjunctivae normal.  Cardiovascular:     Rate and Rhythm: Normal rate.     Pulses: Normal pulses.  Pulmonary:     Effort: Pulmonary effort is normal. No respiratory distress.  Abdominal:     General: Abdomen is flat. There is no distension.     Palpations: Abdomen is soft.     Tenderness: There is no abdominal tenderness. There is no guarding or rebound.     Comments: Abdomen soft, non-tender, non-distended, no rebound/guarding.   Genitourinary:    Comments: Deferred Skin:    General: Skin is warm and dry.     Coloration: Skin is not jaundiced.  Neurological:     General: No focal deficit present.     Mental Status: He is alert and oriented to person, place, and time.  Psychiatric:        Mood and Affect: Mood normal.        Behavior: Behavior normal.     Labs/Imaging:  KUB (02/26/2024) personally reviewed with air throughout the colon, no free air, no gastric or small bowel dilation, and radiologist report pending...   CT Abdomen/Pelvis (02/19/2024) personally reviewed with dilation of the stomach and small bowel in RLQ, some thickening present to bowel in RLQ, appendix appears grossly normal, cholelithiasis without evidence of cholecystitis, and radiologist report reviewed below: IMPRESSION: 1. Positive for abnormal small bowel throughout the right abdomen: Gradually dilated and fluid-filled and/or thick-walled, with abrupt transition point just upstream of the terminal ileum. Inflammation in the affected small bowel mesentery. Trace free fluid in the pelvis. No pneumoperitoneum. Favor Acute Small Bowel Obstruction at the TI, no obstructing etiology is evident. The distribution of loops argues against an internal hernia or closed loop obstruction.   2. Dilated and fluid-filled Stomach also, the duodenum is decompressed. Consider NG tube decompression.   3. Large bowel is decompressed and the appendix is normal.   4.  Cholelithiasis without evidence of acute cholecystitis.   5. Left nephrolithiasis. Prostatomegaly. Aortic Atherosclerosis (ICD10-I70.0), with resolved inflammation of the celiac artery trunk since 2023.     KUB (02/19/2024 - 0930) personally reviewed without stomach dilation, small bowel without air fluid level, air and stool throughout the colon, and radiologist report reviewed:  IMPRESSION: Nonobstructive bowel gas pattern.      Assessment and Plan: 68 y.o. male with now resolved nausea/emesis and abdominal pan, also with cholelithiasis    Enteritis -vs- Small Bowel Obstruction  - Had a very lengthy discussion with the patient regarding his ED presentation, reviewed his imaging studies, and his progression to the point. He has recovered quite quickly, continues to tolerate diet, and has bowel function. I would favor enteritis in this setting. SBO is still certainly a possibility, but with his lack of surgeries this seems less likely. Reviewed my thought process with patient as well in detail.  - I did offer the potential for obtaining CT  Enterography to better evaluate his small bowel especially given decompression of TI in setting of markedly dilated small bowel on presenting CT. He would prefer to wait on this for now. We can also do diagnostic laparoscopy at time of cholecystectomy, which he prefers at this time.  - He was quite concerned with lack of volume in his stools with return of his diet. I reviewed XR from today which was reassuring. There was not significant stool burden on imaging and no signs of obstructive process. Reassurance provided. He will plan to take Miralax at home to see if this helps. Encouraged to monitor his stools and ensure no diarrhea.  - He can return to regular diet, encouraging high fiber, maintaining hydration - No need for any emergent/urgent interventions - Sign and symptoms of obstruction reviewed; Encouraged to call if any issues    Cholelithiasis,  without cholecystitis nor biliary obstruction  - After further discussion, patient did report that he has had some epigastric and RUQ post-prandial pains over the "last few years." His gallbladder appears normal on imaging in 2023 and 2025 but there remains a stone, which he has been aware of. His symptoms seem to be consistent with symptomatic cholelithiasis. There is no evidence of cholecystitis nor biliary obstruction. I did review role for cholecystectomy in this setting. He is interested in pursuing this.  - Reviewed plan for robotic assisted laparoscopic cholecystectomy with Dr Ofilia Benton when available. All risks, benefits, and alternatives to above elective procedure(s) were discussed with the patient, all of his questions were answered to his expressed satisfaction, patient expresses he wishes to proceed, and informed consent was obtained.  - We will get cardiac clearance for surgery as well as to safely come off Eliquis  perioperatively - He will follow up with Dr Ofilia Benton on 05/29 as scheduled for surgical planning   Face-to-face time spent with the patient and care providers was 50 minutes, with more than 50% of the time spent counseling, educating, and coordinating care of the patient.     Apolonio Bay, PA-C [facility] 02/26/2024, 4:15 PM M-F: 7am - 4pm

## 2024-02-26 NOTE — Telephone Encounter (Signed)
 Faxed cardiac clearance to Dr. Jonnie Nettles at [phone].

## 2024-02-26 NOTE — Patient Instructions (Addendum)
 Gallbladder Eating Plan  High blood cholesterol, obesity, a sedentary lifestyle, an unhealthy diet, and diabetes are risk factors for developing gallstones. If you have a gallbladder condition, you may have trouble digesting fats and tolerating high fat intake. Eating a low-fat diet can help reduce your symptoms and may be helpful before and after having surgery to remove your gallbladder (cholecystectomy). Your health care provider may recommend that you work with a dietitian to help you reduce the amount of fat in your diet. What are tips for following this plan? General guidelines Limit your fat intake to less than 30% of your total daily calories. If you eat around 1,800 calories each day, this means eating less than 60 grams (g) of fat per day. Fat is an important part of a healthy diet. Eating a low-fat diet can make it hard to maintain a healthy body weight. Ask your dietitian how much fat, calories, and other nutrients you need each day. Eat small, frequent meals throughout the day instead of three large meals. Drink at least 8-10 cups (1.9-2.4 L) of fluid a day. Drink enough fluid to keep your urine pale yellow. If you drink alcohol: Limit how much you have to: 0-1 drink a day for women who are not pregnant. 0-2 drinks a day for men. Know how much alcohol is in a drink. In the [place] one drink equals one 12 oz bottle of beer (355 mL), one 5 oz glass of wine (148 mL), or one 1 oz glass of hard liquor (44 mL). Reading food labels  Check nutrition facts on food labels for the amount of fat per serving. Choose foods with less than 3 grams of fat per serving. Shopping Choose nonfat and low-fat healthy foods. Look for the words "nonfat," "low-fat," or "fat-free." Avoid buying processed or prepackaged foods. Cooking Cook using low-fat methods, such as baking, broiling, grilling, or boiling. Cook with small amounts of healthy fats, such as olive oil, grapeseed oil, canola oil, avocado oil, or  sunflower oil. What foods are recommended?  All fresh, frozen, or canned fruits and vegetables. Whole grains. Low-fat or nonfat (skim) milk and yogurt. Lean meat, skinless poultry, fish, eggs, and beans. Low-fat protein supplement powders or drinks. Spices and herbs. The items listed above may not be a complete list of foods and beverages you can eat and drink. Contact a dietitian for more information. What foods are not recommended? High-fat foods. These include baked goods, fast food, fatty cuts of meat, ice cream, french toast, sweet rolls, pizza, cheese bread, foods covered with butter, creamy sauces, or cheese. Fried foods. These include french fries, tempura, battered fish, breaded chicken, fried breads, and sweets. Foods that cause bloating and gas. The items listed above may not be a complete list of foods that you should avoid. Contact a dietitian for more information. Summary A low-fat diet can be helpful if you have a gallbladder condition, or before and after gallbladder surgery. Limit your fat intake to less than 30% of your total daily calories. This is about 60 g of fat if you eat 1,800 calories each day. Eat small, frequent meals throughout the day instead of three large meals. This information is not intended to replace advice given to you by your health care provider. Make sure you discuss any questions you have with your health care provider. Document Revised: 09/08/2021 Document Reviewed: 09/08/2021 Elsevier Patient Education  2024 [employer]   Bowel Obstruction A bowel obstruction means that something is blocking the small or  large bowel. The bowel is also called the intestine. It is the long tube that connects the stomach to the opening of the butt (anus). When something blocks the bowel, food and fluids cannot pass through like normal. This condition needs to be treated. Treatment depends on the cause of the problem and how bad the problem is. What are the  causes? Common causes of this condition include: Scar tissue inside the body from past surgery or from high-energy X-rays (radiation). Recent surgery in the belly. This affects how food moves in the bowel. Some diseases, such as: Irritation of the lining of the digestive tract (Crohn's disease). Irritation of small pouches in the bowel (diverticulitis). Growths or tumors. A bulging organ or tissue (hernia). Twisting of the bowel (volvulus). A swallowed object. Slipping of a part of the bowel into another part (intussusception). What are the signs or symptoms? Symptoms of this condition include: Pain in the belly. Feeling like you may vomit (nauseous). Vomiting. Bloating in the belly. Being unable to pass gas. Trouble pooping (constipation). A lot of belching. Watery poop (diarrhea). How is this treated? Treatment for this condition may include: Fluids and pain medicines that are given through an IV tube. Your doctor may tell you not to eat or drink if you feel like you may vomit or are vomiting. Eating a clear liquid diet for a few days. Putting a small tube (nasogastric tube) through the nose, down the throat, and into the stomach. This will help with pain, discomfort, and the feeling like you may vomit. Surgery. This may be needed if other treatments do not work. Follow these instructions at home: Medicines Take over-the-counter and prescription medicines only as told by your doctor. If you were prescribed an antibiotic medicine, take it as told by your doctor. Do not stop taking the antibiotic even if you start to feel better. General instructions Follow instructions from your doctor about what you can or cannot eat and drink. You may need to: Only drink clear liquids until you start to get better. Avoid solid foods. Return to your normal activities when your doctor says that it is safe. Rest as told by your doctor. Get up to take short walks every 1 to 2 hours. Ask for help  if you feel weak or unsteady. Keep all follow-up visits. How is this prevented? After having a bowel obstruction, you may be more likely to have another. You can do some things to stop it from happening again. If you have a long-term (chronic) disease, contact your doctor if you see changes or problems. Avoid having trouble pooping. You may need to take these actions to prevent or treat trouble pooping: Drink enough fluid to keep your pee (urine) pale yellow. Take over-the-counter or prescription medicines. Eat foods that are high in fiber. These include beans, whole grains, and fresh fruits and vegetables. Limit foods that are high in fat and sugar. These include fried or sweet foods. Stay active. Ask your doctor which exercises are safe for you. Avoid stress. Eat three small meals and three small snacks each day. Work with a diet and Hospital doctor (dietitian) to make a meal plan that works for you. Do not smoke or use any products that contain nicotine or tobacco. If you need help quitting, ask your doctor. Contact a doctor if: You have a fever. You have chills. Get help right away if: You have pain or cramps that get worse. You vomit blood. You feel like you may vomit and the  feeling lasts a long time. You cannot stop vomiting. You cannot drink fluids. You feel confused. You feel very thirsty (dehydrated). Your belly gets more bloated. You feel weak or you faint. Summary A bowel obstruction means that something is blocking the small or large bowel. Treatment may include IV fluids and pain medicine. You may also have a clear liquid diet, a small tube in your stomach, or surgery. Drink clear liquids and avoid solid foods until you get better, as told by your doctor. This information is not intended to replace advice given to you by your health care provider. Make sure you discuss any questions you have with your health care provider. Document Revised: 11/06/2020 Document  Reviewed: 11/06/2020 Elsevier Patient Education  2024 [employer].

## 2024-03-04 NOTE — Progress Notes (Unsigned)
 Patient ID: Alex Huffman, male   DOB: June 21, 1956, 68 y.o.   MRN: [id number]  Chief Complaint: Prior history of right lower quadrant abnormal CT, with partial SBO resolved.  And symptomatic cholelithiasis.  History of Present Illness Alex Huffman is a 68 y.o. male with the above, presenting in follow-up to review, and anticipate consider surgical options.  He has been careful with his diet since our last discussion, and has been avoiding fatty foods.  He reports he is doing well.  Denies any abdominal pain.  Has reviewed his prior symptoms with epigastric/right upper quadrant and radiation to right lower quadrant discomforts, typically associated with fatty foods.  Bowel movements are still daily but small in caliber.  Prior history of Present Illness: Alex Huffman is a 68 y.o. male known to our service following admission from 05/14-05/15 for possible SBO vs enteritis. He has done well since being discharged. He was able to make it to [place]  for his family wedding without incident. He is following soft/mushy diet as instructed. He reports no further abdominal pain, nausea, nor emesis. He is passing flatus. He is having bowel movements but reports these are smaller in caliber and volume than previously. Does not feel that he has gone back to his "regular" yet. Additionally, I did reviewed finding of cholelithiasis. He reports being aware of this for some time. During our discussion, he did report that he has noticed epigastric and RUQ discomfort periodically over the last few years. This can be post-prandial in nature. No fever, chills, juandice. Only previous abdominal surgery is right inguinal hernia repair. He is on Eliquis  secondary to atrial fibrillation. Otherwise, no further complaints    Past Medical History Past Medical History:  Diagnosis Date   Anxiety    Atrial fibrillation (HCC)    Cataract    Chronic headaches    Helicobacter pylori gastritis 2009   EGD   Hyperlipemia     Personal history of colonic polyps       Past Surgical History:  Procedure Laterality Date   CARDIOVERSION N/A 11/09/2020   Procedure: CARDIOVERSION;  Surgeon: Harrold Lincoln, MD;  Location: [facility];  Service: Cardiovascular;  Laterality: N/A;   COLONOSCOPY  , 06/08/2011   2009: two 3 mm hyperplastic polyps, lipoma, internal hemorrjhoids 2012: 4mm cecal serrated adenoma, lipoma, internal hemorrhoids   HERNIA REPAIR  05/04   ROTATOR CUFF REPAIR  05/06   UPPER GASTROINTESTINAL ENDOSCOPY  02/02/2008   erosive gastropathy    Allergies  Allergen Reactions   Typhoid Vaccine, Live Other (See Comments) and Anaphylaxis   Typhoid Vi Polysaccharide Vaccine Anaphylaxis and Other (See Comments)    Comatose, almost died    Amlodipine Other (See Comments)    Leg pain and fatigue   Atorvastatin  Diarrhea    Body ache   Azithromycin  Other (See Comments)    Rapid Heart Rate/Palpitations   Doxycycline Other (See Comments)    Severe Stomach Issues   Nitrofurantoin Other (See Comments) and Nausea And Vomiting   Sulfa Antibiotics Hives    Current Outpatient Medications  Medication Sig Dispense Refill   apixaban  (ELIQUIS ) 5 MG TABS tablet Take 1 tablet (5 mg total) by mouth 2 (two) times daily. 60 tablet 5   levothyroxine  (SYNTHROID ) 25 MCG tablet Take 1 tablet by mouth once daily 90 tablet 3   pantoprazole  (PROTONIX ) 40 MG tablet Take 1 tablet (40 mg total) by mouth daily. (Patient not taking: Reported on 03/05/2024) 30 tablet 1   promethazine  (  PHENERGAN ) 25 MG tablet Take 0.5-1 tablets (12.5-25 mg total) by mouth every 8 (eight) hours as needed for nausea or vomiting. (Patient not taking: Reported on 03/05/2024) 30 tablet 2   No current facility-administered medications for this visit.    Family History Family History  Problem Relation Age of Onset   Heart disease Father    Celiac disease Mother    Colon cancer Neg Hx    Esophageal cancer Neg Hx    Liver cancer Neg Hx     Pancreatic cancer Neg Hx    Rectal cancer Neg Hx    Stomach cancer Neg Hx    Allergic rhinitis Neg Hx       Social History Social History   Tobacco Use   Smoking status: Never    Passive exposure: Never   Smokeless tobacco: Never  Vaping Use   Vaping status: Never Used  Substance Use Topics   Alcohol use: Not Currently    Comment: Occasional beer   Drug use: No        ROS Constitutional:  Negative for chills and fever.  Respiratory:  Negative for cough and shortness of breath.   Cardiovascular:  Negative for chest pain and palpitations.  Gastrointestinal:  Negative for abdominal pain, blood in stool, diarrhea, nausea and vomiting.  Genitourinary:  Negative for dysuria and urgency.  All other systems reviewed and are negative.  Physical Exam Blood pressure 124/74, pulse (!) 50, height 5\' 7"  (1.702 m), weight 172 lb (78 kg), SpO2 98%. Last Weight  Most recent update: 03/05/2024  8:58 AM    Weight  78 kg (172 lb)             CONSTITUTIONAL: Well developed, and nourished, appropriately responsive and aware without distress.   EYES: Sclera non-icteric.   EARS, NOSE, MOUTH AND THROAT:  The oropharynx is clear. Oral mucosa is pink and moist.    Hearing is intact to voice.  NECK: Trachea is midline, and there is no jugular venous distension.  LYMPH NODES:  Lymph nodes in the neck are not appreciated. RESPIRATORY:  Lungs are clear, and breath sounds are equal bilaterally.  Normal respiratory effort without pathologic use of accessory muscles. CARDIOVASCULAR: Heart is regular in rate and rhythm.   Well perfused.  GI: The abdomen is  soft, nontender, and nondistended. There were no palpable masses.  I did not appreciate hepatosplenomegaly. There were normal bowel sounds.   MUSCULOSKELETAL:  Symmetrical muscle tone appreciated in all four extremities.    SKIN: Skin turgor is normal. No pathologic skin lesions appreciated.  NEUROLOGIC:  Motor and sensation appear grossly  normal.  Cranial nerves are grossly without defect. PSYCH:  Alert and oriented to person, place and time. Affect is appropriate for situation.  Data Reviewed I have personally reviewed what is currently available of the patient's imaging, recent labs and medical records.   Labs:     Latest Ref Rng & Units 02/20/2024    4:56 AM 02/19/2024    3:28 AM 01/23/2024    9:18 AM  CBC  WBC 4.0 - 10.5 K/uL 5.3  11.7  7.6   Hemoglobin 13.0 - 17.0 g/dL 16.1  09.6  04.5   Hematocrit 39.0 - 52.0 % 36.3  45.2  42.9   Platelets 150 - 400 K/uL 164  203  245.0       Latest Ref Rng & Units 02/20/2024    4:56 AM 02/19/2024    3:28 AM 01/23/2024    9:27  AM  CMP  Glucose 70 - 99 mg/dL 161  096  94   BUN 8 - 23 mg/dL 20  33  18   Creatinine 0.61 - 1.24 mg/dL 0.45  4.09  8.11   Sodium 135 - 145 mmol/L 140  139  143   Potassium 3.5 - 5.1 mmol/L 4.1  4.0  4.7   Chloride 98 - 111 mmol/L 108  105  105   CO2 22 - 32 mmol/L 27  24  28    Calcium  8.9 - 10.3 mg/dL 8.3  9.4  9.5   Total Protein 6.5 - 8.1 g/dL  7.3  6.9   Total Bilirubin 0.0 - 1.2 mg/dL  1.0  1.0   Alkaline Phos 38 - 126 U/L  48  64   AST 15 - 41 U/L  24  19   ALT 0 - 44 U/L  25  20    Imaging: Radiological images reviewed:  CLINICAL DATA:  Emesis   EXAM: ABDOMEN - 2 VIEW  Feb 26, 2024   COMPARISON:  02/19/2024   FINDINGS: Nonobstructed gas pattern with mild stool. Small left kidney stone measuring 6 mm.   IMPRESSION: Nonobstructed gas pattern with mild stool. Small left kidney stone.     Electronically Signed   By: Esmeralda Hedge M.D.   On: 03/04/2024 19:45 Within last 24 hrs: No results found.  Assessment    Patient Active Problem List   Diagnosis Date Noted   CCC (chronic calculous cholecystitis) 03/05/2024   Small bowel obstruction (HCC) 02/19/2024   SBO (small bowel obstruction) (HCC) 02/19/2024   Nasal congestion 01/23/2024   Frequent infections 01/23/2024   Cough 09/23/2023   LLQ pain 04/03/2023   Rectal pain  04/03/2023   Calculus of gallbladder without cholecystitis without obstruction 11/06/2022   Elevated LFTs 11/06/2022   Abnormal CT of the abdomen 09/28/2022   Acute prostatitis 06/28/2022   Generalized abdominal pain 06/28/2022   Abdominal pain, right lower quadrant 03/29/2022   Acute non-recurrent maxillary sinusitis 09/25/2021   Hypothyroidism 12/12/2020   Secondary hypercoagulable state (HCC) 11/03/2020   Chronic diastolic congestive heart failure (HCC) 10/27/2020   History of TIAs 10/27/2020   Primary hypertension    Paroxysmal atrial fibrillation (HCC) 10/18/2020   Combined forms of age-related cataract of both eyes 12/06/2016   Bradycardia 05/09/2016   Herniated lumbar disc without myelopathy 10/21/2014   Benign localized prostatic hyperplasia with lower urinary tract symptoms (LUTS) 10/15/2012   Chronic prostatitis 10/15/2012   ED (erectile dysfunction) of organic origin 10/15/2012   Elevated PSA 10/15/2012   History of colonic polyps 06/15/2011   Hemorrhoids, internal, with bleeding 05/28/2011    Plan    Options and diet and expectations with and without surgery discussed thoroughly.  Optimal plan is for robotic cholecystectomy utilizing ICG imaging, and an attempt to visualize the right lower quadrant process if any present. Risks and benefits have been discussed with the patient which include but are not limited to anesthesia, bleeding, infection, biliary ductal injury, resulting in leak or stenosis, other associated unanticipated injuries affiliated with laparoscopic surgery.   Reviewed that removing the gallbladder will only address the symptoms related to the gallbladder itself.  I believe there is the desire to proceed, accepting the risks with understanding.  Questions elicited and answered to satisfaction.    No guarantees ever expressed or implied.   Enteritis -vs- Small Bowel Obstruction: Enteritis heavily favored due to the absence of risk factors.  We will get  cardiac clearance for surgery as well as to safely come off Eliquis  perioperatively  Face-to-face time spent with the patient and accompanying care providers(if present) was 30 minutes, spent counseling, educating, and coordinating care of the patient.    These notes generated with voice recognition software. I apologize for typographical errors.  Flynn Hylan M.D., FACS 03/05/2024, 9:49 AM

## 2024-03-05 ENCOUNTER — Encounter: Payer: Self-pay | Admitting: Surgery

## 2024-03-05 ENCOUNTER — Ambulatory Visit (INDEPENDENT_AMBULATORY_CARE_PROVIDER_SITE_OTHER): Admitting: Surgery

## 2024-03-05 VITALS — BP 124/74 | HR 50 | Ht 67.0 in | Wt 172.0 lb

## 2024-03-05 DIAGNOSIS — K8020 Calculus of gallbladder without cholecystitis without obstruction: Secondary | ICD-10-CM

## 2024-03-05 DIAGNOSIS — K8010 Calculus of gallbladder with chronic cholecystitis without obstruction: Secondary | ICD-10-CM | POA: Diagnosis not present

## 2024-03-05 NOTE — Patient Instructions (Addendum)
 Follow-up with our office as needed.  Please call and ask to speak with a nurse if you develop questions or concerns.    Minimally Invasive Cholecystectomy Minimally invasive cholecystectomy is surgery to remove the gallbladder. The gallbladder is a pear-shaped organ that lies beneath the liver on the right side of the body. The gallbladder stores bile, which is a fluid that helps the body digest fats. Cholecystectomy is often done to treat inflammation (irritation and swelling) of the gallbladder (cholecystitis). This condition is usually caused by a buildup of gallstones (cholelithiasis) in the gallbladder or when the fluid in the gall bladder becomes stagnant because gallstones get stuck in the ducts (tubes) and block the flow of bile. This can result in inflammation and pain. In severe cases, emergency surgery may be required. This procedure is done through small incisions in the abdomen, instead of one large incision. It is also called laparoscopic surgery. A thin scope with a camera (laparoscope) is inserted through one incision. Then surgical instruments are inserted through the other incisions. In some cases, a minimally invasive surgery may need to be changed to a surgery that is done through a larger incision. This is called open surgery. Tell a health care provider about: Any allergies you have. All medicines you are taking, including vitamins, herbs, eye drops, creams, and over-the-counter medicines. Any problems you or family members have had with anesthetic medicines. Any bleeding problems you have. Any surgeries you have had. Any medical conditions you have. Whether you are pregnant or may be pregnant. What are the risks? Generally, this is a safe procedure. However, problems may occur, including: Infection. Bleeding. Allergic reactions to medicines. Damage to nearby structures or organs. A gallstone remaining in the common bile duct. The common bile duct carries bile from the  gallbladder to the small intestine. A bile leak from the liver or cystic duct after your gallbladder is removed. What happens before the procedure? When to stop eating and drinking Follow instructions from your health care provider about what you may eat and drink before your procedure. These may include: 8 hours before the procedure Stop eating most foods. Do not eat meat, fried foods, or fatty foods. Eat only light foods, such as toast or crackers. All liquids are okay except energy drinks and alcohol. 6 hours before the procedure Stop eating. Drink only clear liquids, such as water, clear fruit juice, black coffee, plain tea, and sports drinks. Do not drink energy drinks or alcohol. 2 hours before the procedure Stop drinking all liquids. You may be allowed to take medicines with small sips of water. If you do not follow your health care provider's instructions, your procedure may be delayed or canceled. Medicines Ask your health care provider about: Changing or stopping your regular medicines. This is especially important if you are taking diabetes medicines or blood thinners. Taking medicines such as aspirin and ibuprofen. These medicines can thin your blood. Do not take these medicines unless your health care provider tells you to take them. Taking over-the-counter medicines, vitamins, herbs, and supplements. General instructions If you will be going home right after the procedure, plan to have a responsible adult: Take you home from the hospital or clinic. You will not be allowed to drive. Care for you for the time you are told. Do not use any products that contain nicotine or tobacco for at least 4 weeks before the procedure. These products include cigarettes, chewing tobacco, and vaping devices, such as e-cigarettes. If you need help quitting,  ask your health care provider. Ask your health care provider: How your surgery site will be marked. What steps will be taken to help  prevent infection. These may include: Removing hair at the surgery site. Washing skin with a germ-killing soap. Taking antibiotic medicine. What happens during the procedure?  An IV will be inserted into one of your veins. You will be given one or both of the following: A medicine to help you relax (sedative). A medicine to make you fall asleep (general anesthetic). Your surgeon will make several small incisions in your abdomen. The laparoscope will be inserted through one of the small incisions. The camera on the laparoscope will send images to a monitor in the operating room. This lets your surgeon see inside your abdomen. A gas will be pumped into your abdomen. This will expand your abdomen to give the surgeon more room to perform the surgery. Other tools that are needed for the procedure will be inserted through the other incisions. The gallbladder will be removed through one of the incisions. Your common bile duct may be examined. If stones are found in the common bile duct, they may be removed. After your gallbladder has been removed, the incisions will be closed with stitches (sutures), staples, or skin glue. Your incisions will be covered with a bandage (dressing). The procedure may vary among health care providers and hospitals. What happens after the procedure? Your blood pressure, heart rate, breathing rate, and blood oxygen level will be monitored until you leave the hospital or clinic. You will be given medicines as needed to control your pain. You may have a drain placed in the incision. The drain will be removed a day or two after the procedure. Summary Minimally invasive cholecystectomy, also called laparoscopic cholecystectomy, is surgery to remove the gallbladder using small incisions. Tell your health care provider about all the medical conditions you have and all the medicines you are taking for those conditions. Before the procedure, follow instructions about when to  stop eating and drinking and changing or stopping medicines. Plan to have a responsible adult care for you for the time you are told after you leave the hospital or clinic. This information is not intended to replace advice given to you by your health care provider. Make sure you discuss any questions you have with your health care provider. Document Revised: 03/28/2021 Document Reviewed: 03/28/2021 Elsevier Patient Education  2024 [employer].

## 2024-04-02 ENCOUNTER — Other Ambulatory Visit (HOSPITAL_COMMUNITY): Payer: Self-pay

## 2024-05-13 IMAGING — CT CT ABD-PELV W/ CM
2 of 5 series · 15 of 46 positions shown, 17 images · IV contrast (APPLIED)
Comparison: None Available.

CLINICAL DATA: Right lower quadrant abdominal pain

EXAM:
CT ABDOMEN AND PELVIS WITH CONTRAST
TECHNIQUE: Multidetector CT imaging of the abdomen and pelvis was performed
using the standard protocol following bolus administration of
intravenous contrast.

[Series 2: abdomen 5.0 · axial · 0.68mm/px · z∈[+931,+1371]mm · 12 of 104 slices shown, 14 images]
[im 8/104  soft-tissue]
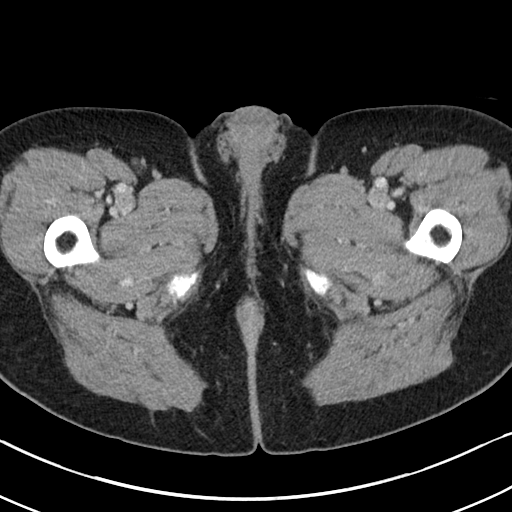
[im 8/104  bone]
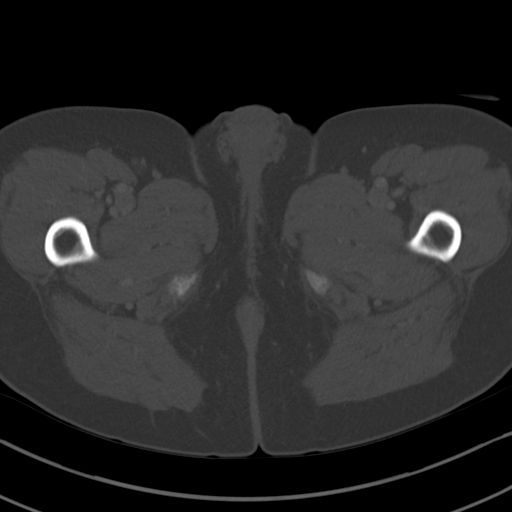
[im 16/104  soft-tissue]
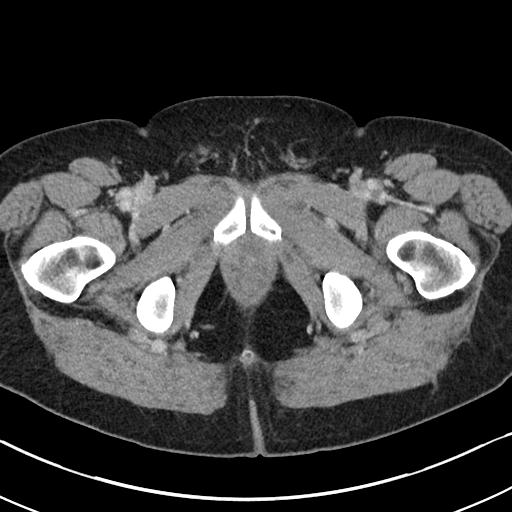
[im 24/104  soft-tissue]
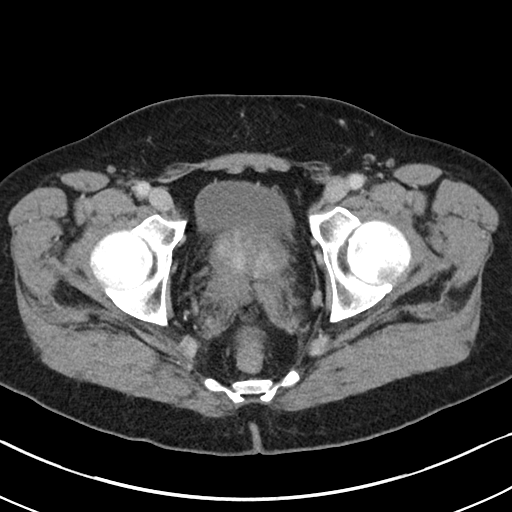
[im 32/104  soft-tissue]
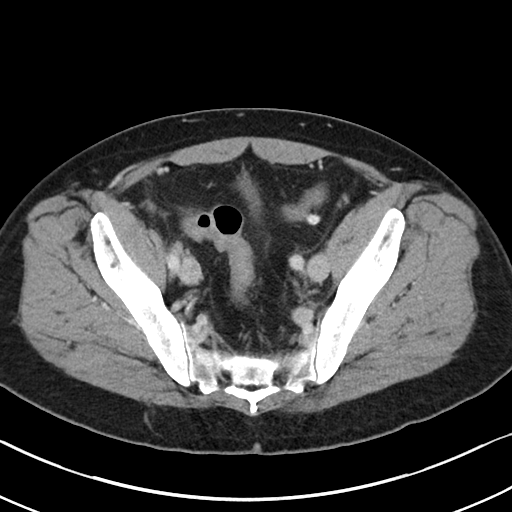
[im 40/104  soft-tissue]
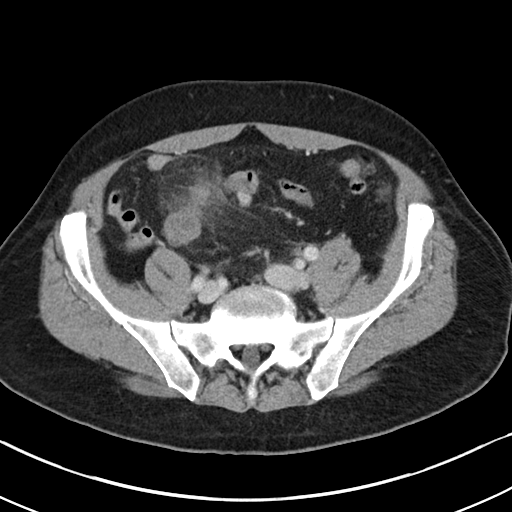
[im 48/104  soft-tissue]
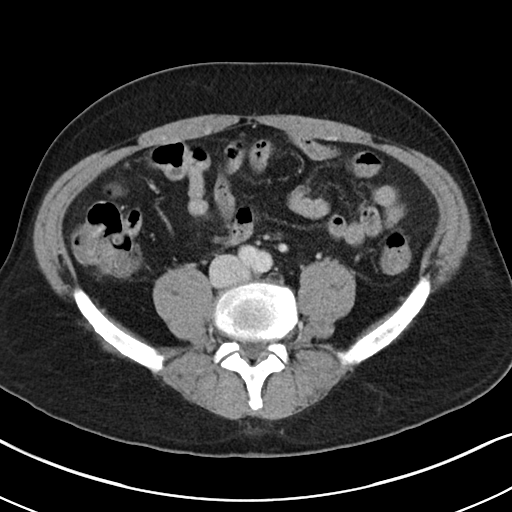
[im 56/104  soft-tissue]
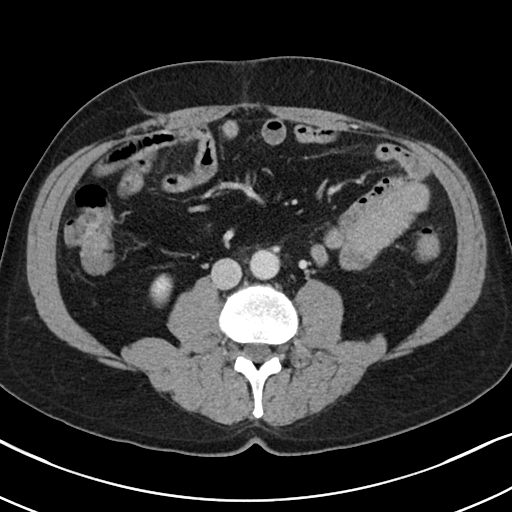
[im 64/104  soft-tissue]
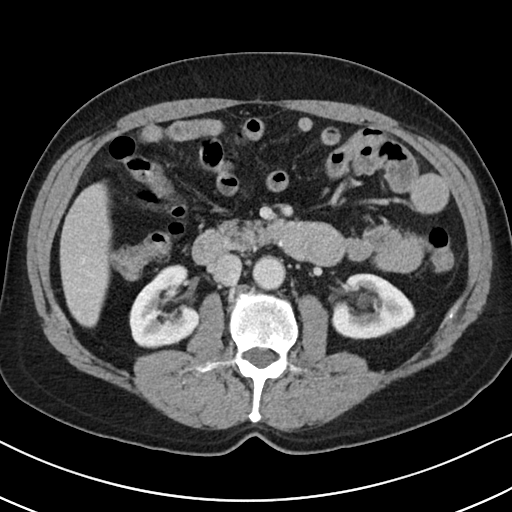
[im 72/104  soft-tissue]
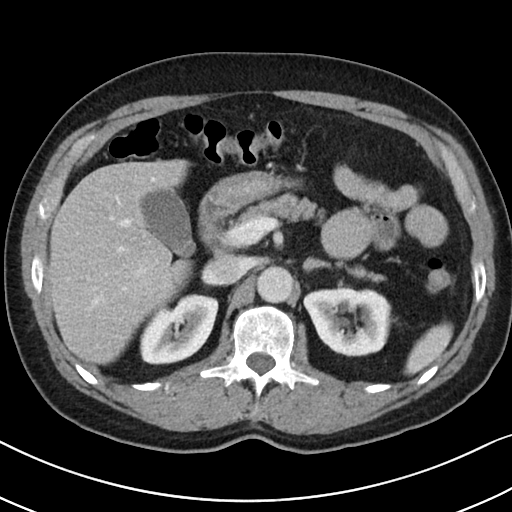
[im 72/104  bone]
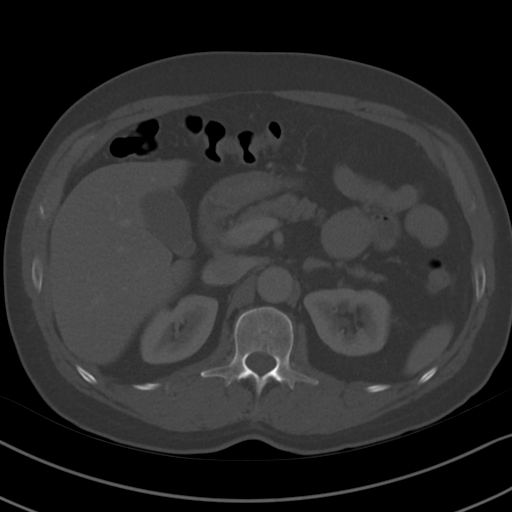
[im 80/104  soft-tissue]
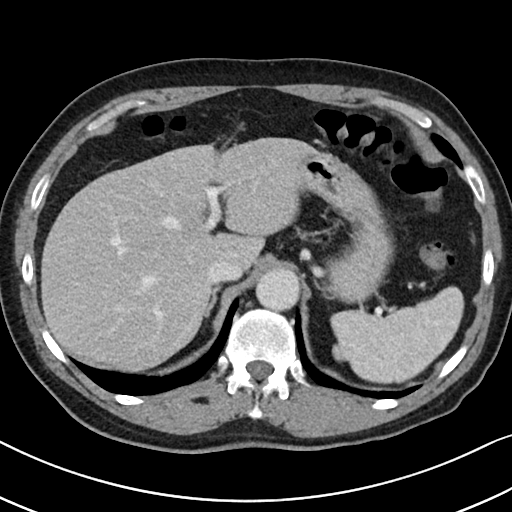
[im 88/104  soft-tissue]
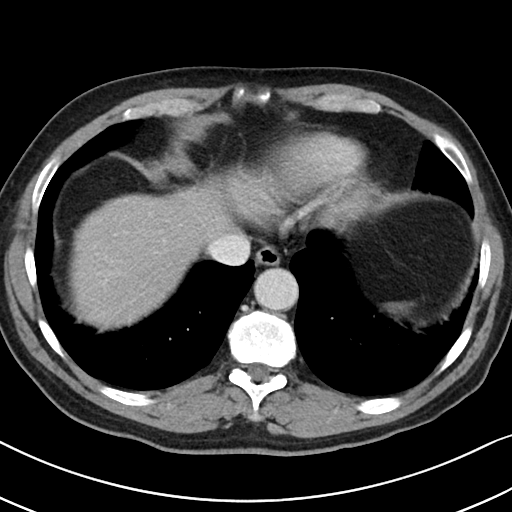
[im 96/104  soft-tissue]
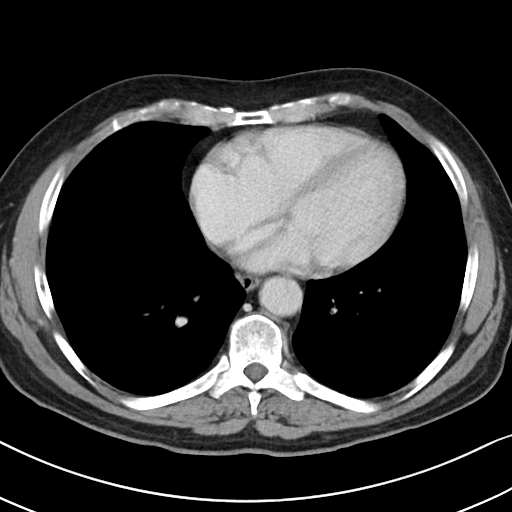

[Series 4: abdomen 3.0 mpr cor · coronal · 0.84mm/px · 3 of 93 slices shown]
[im 31/93  soft-tissue]
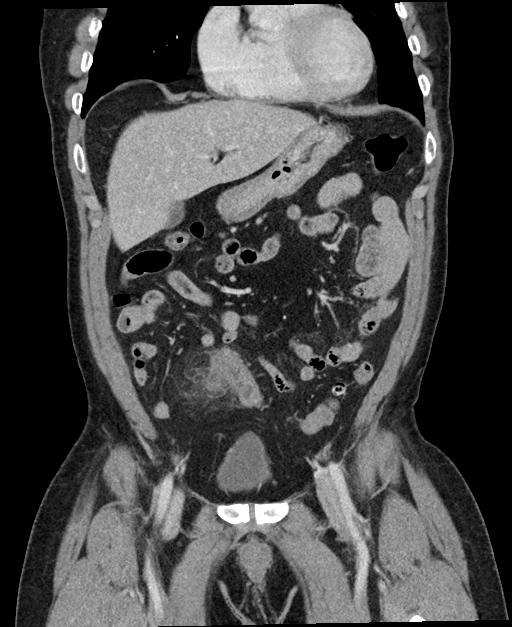
[im 41/93  soft-tissue]
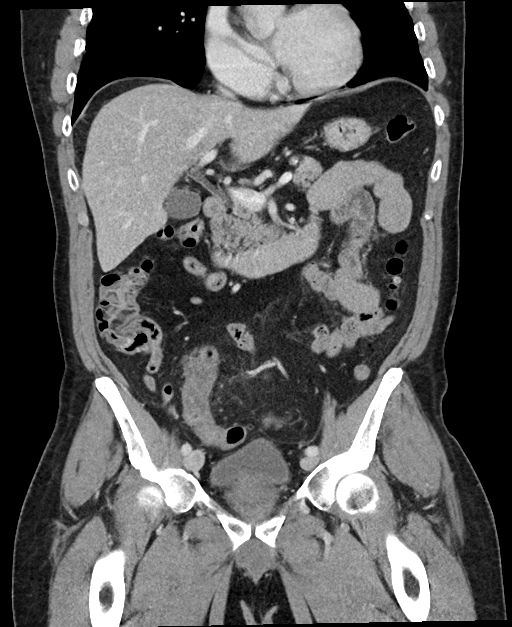
[im 52/93  soft-tissue]
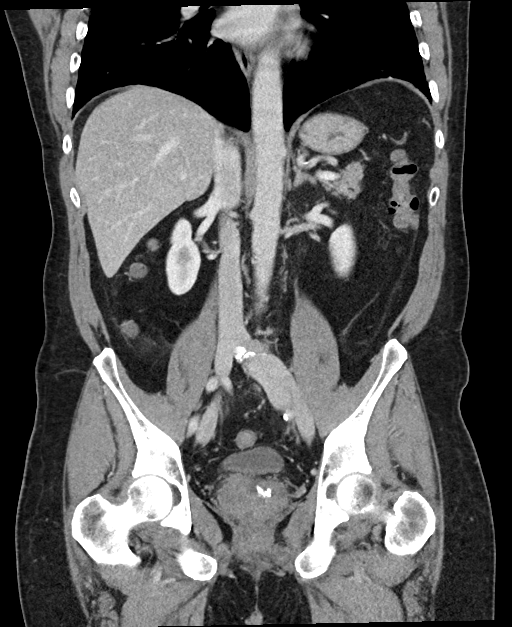

[15 of 46 positions shown; findings below may reference images not displayed]

RADIATION DOSE REDUCTION: This exam was performed according to the
departmental dose-optimization program which includes automated
exposure control, adjustment of the mA and/or kV according to
patient size and/or use of iterative reconstruction technique.

CONTRAST:  100mL OMNIPAQUE IOHEXOL 300 MG/ML  SOLN
FINDINGS: Lower chest: No acute abnormality.

Hepatobiliary: No suspicious liver lesions. Cholelithiasis. No
gallbladder wall thickening. No biliary ductal dilation.

Pancreas: Unremarkable. No pancreatic ductal dilatation or
surrounding inflammatory changes.

Spleen: Normal in size without focal abnormality.

Adrenals/Urinary Tract: Bilateral adrenal glands are unremarkable.
No hydronephrosis. Nonobstructing stone of the lower pole of the
left kidney measuring 4 mm. Bladder is unremarkable.

Stomach/Bowel: Focal wall thickening and inflammatory change of the
sigmoid colon with associated inflamed diverticula. Sigmoid colon is
redundant with inflamed portion positioned in the right hemiabdomen.
Diverticulosis. Normal appearance of the stomach. Normal appendix.
No evidence of obstruction.

Vascular/Lymphatic: Aortic atherosclerosis. No enlarged abdominal or
pelvic lymph nodes.

Reproductive: Prostatomegaly.

Other: No free fluid or free air seen in the abdomen or pelvis.

Musculoskeletal: No acute or significant osseous findings.
IMPRESSION: 1. Focal wall thickening and inflammatory change of the sigmoid
colon with associated inflamed diverticula, findings are compatible
with acute uncomplicated diverticulitis.Of note, sigmoid colon is
redundant with inflamed portion positioned in the right hemiabdomen.
2. Cholelithiasis and nonobstructing left lower pole renal stone.
3.  Aortic Atherosclerosis ([id number]).

## 2024-09-05 ENCOUNTER — Other Ambulatory Visit (HOSPITAL_COMMUNITY): Payer: Self-pay

## 2024-11-02 ENCOUNTER — Encounter: Payer: Self-pay | Admitting: Family Medicine

## 2024-11-04 ENCOUNTER — Ambulatory Visit: Payer: Medicare Other

## 2024-11-04 VITALS — Ht 67.0 in | Wt 172.0 lb

## 2024-11-04 DIAGNOSIS — Z0000 Encounter for general adult medical examination without abnormal findings: Secondary | ICD-10-CM

## 2024-11-04 NOTE — Progress Notes (Signed)
 "  Please attest and cosign this visit due to patients primary care provider not being in the office at the time the visit was completed.     Chief Complaint  Patient presents with   Medicare Wellness     Subjective:   Alex Huffman is a 69 y.o. male who presents for a Medicare Annual Wellness Visit.  Visit info / Clinical Intake: Medicare Wellness Visit Type:: Subsequent Annual Wellness Visit Persons participating in visit and providing information:: patient Medicare Wellness Visit Mode:: Telephone If telephone:: video declined Since this visit was completed virtually, some vitals may be partially provided or unavailable. Missing vitals are due to the limitations of the virtual format.: Unable to obtain vitals - no equipment If Telephone or Video please confirm:: I connected with patient using audio/video enable telemedicine. I verified patient identity with two identifiers, discussed telehealth limitations, and patient agreed to proceed. Patient Location:: home Provider Location:: home office Interpreter Needed?: No Pre-visit prep was completed: yes AWV questionnaire completed by patient prior to visit?: no Living arrangements:: lives with spouse/significant other Patient's Overall Health Status Rating: very good Typical amount of pain: none Does pain affect daily life?: no Are you currently prescribed opioids?: no  Dietary Habits and Nutritional Risks How many meals a day?: 2 Eats fruit and vegetables daily?: yes Most meals are obtained by: preparing own meals In the last 2 weeks, have you had any of the following?: none Diabetic:: no  Functional Status Activities of Daily Living (to include ambulation/medication): Independent Ambulation: Independent Medication Administration: Independent Home Management (perform basic housework or laundry): Independent Manage your own finances?: yes Primary transportation is: driving Concerns about vision?: no *vision screening is  required for WTM* Concerns about hearing?: no  Fall Screening Falls in the past year?: 1 Number of falls in past year: 0 Was there an injury with Fall?: 0 Fall Risk Category Calculator: 1 Patient Fall Risk Level: Low Fall Risk  Fall Risk Patient at Risk for Falls Due to: No Fall Risks Fall risk Follow up: Falls evaluation completed; Education provided; Falls prevention discussed  Home and Transportation Safety: All rugs have non-skid backing?: yes All stairs or steps have railings?: yes Grab bars in the bathtub or shower?: yes Have non-skid surface in bathtub or shower?: yes Good home lighting?: yes Regular seat belt use?: yes Hospital stays in the last year:: no  Cognitive Assessment Difficulty concentrating, remembering, or making decisions? : no Will 6CIT or Mini Cog be Completed: yes What year is it?: 0 points What month is it?: 0 points Give patient an address phrase to remember (5 components): [address] About what time is it?: 0 points Count backwards from 20 to 1: 0 points Say the months of the year in reverse: 0 points Repeat the address phrase from earlier: 0 points 6 CIT Score: 0 points  Advance Directives (For Healthcare) Does Patient Have a Medical Advance Directive?: Yes Does patient want to make changes to medical advance directive?: No - Patient declined Type of Advance Directive: Healthcare Power of [place]; Living will Copy of Healthcare Power of Attorney in Chart?: No - copy requested Copy of Living Will in Chart?: No - copy requested Would patient like information on creating a medical advance directive?: No - Patient declined  Reviewed/Updated  Reviewed/Updated: Reviewed All (Medical, Surgical, Family, Medications, Allergies, Care Teams, Patient Goals)    Allergies (verified) Typhoid vaccine, live; Typhoid vi polysaccharide vaccine; Amlodipine; Atorvastatin ; Azithromycin ; Doxycycline; Nitrofurantoin; and Sulfa antibiotics  Current Medications (verified) Outpatient Encounter Medications as of 11/04/2024  Medication Sig   apixaban  (ELIQUIS ) 5 MG TABS tablet Take 1 tablet (5 mg total) by mouth 2 (two) times daily.   levothyroxine  (SYNTHROID ) 25 MCG tablet Take 1 tablet by mouth once daily   pantoprazole  (PROTONIX ) 40 MG tablet Take 1 tablet (40 mg total) by mouth daily.   promethazine  (PHENERGAN ) 25 MG tablet Take 0.5-1 tablets (12.5-25 mg total) by mouth every 8 (eight) hours as needed for nausea or vomiting.   No facility-administered encounter medications on file as of 11/04/2024.    History: Past Medical History:  Diagnosis Date   Anxiety    Atrial fibrillation (HCC)    Cataract    Chronic headaches    Helicobacter pylori gastritis 2009   EGD   Hyperlipemia    Personal history of colonic polyps    Past Surgical History:  Procedure Laterality Date   CARDIOVERSION N/A 11/09/2020   Procedure: CARDIOVERSION;  Surgeon: Barbaraann Darryle Ned, MD;  Location: [facility] ENDOSCOPY;  Service: Cardiovascular;  Laterality: N/A;   COLONOSCOPY  , 06/08/2011   2009: two 3 mm hyperplastic polyps, lipoma, internal hemorrjhoids 2012: 4mm cecal serrated adenoma, lipoma, internal hemorrhoids   HERNIA REPAIR  05/04   ROTATOR CUFF REPAIR  05/06   UPPER GASTROINTESTINAL ENDOSCOPY  02/02/2008   erosive gastropathy   Family History  Problem Relation Age of Onset   Heart disease Father    Celiac disease Mother    Colon cancer Neg Hx    Esophageal cancer Neg Hx    Liver cancer Neg Hx    Pancreatic cancer Neg Hx    Rectal cancer Neg Hx    Stomach cancer Neg Hx    Allergic rhinitis Neg Hx    Social History   Occupational History   Occupation: Retired  Tobacco Use   Smoking status: Never    Passive exposure: Never   Smokeless tobacco: Never  Vaping Use   Vaping status: Never Used  Substance and Sexual Activity   Alcohol use: Not Currently    Comment: Occasional beer   Drug use: No   Sexual activity: Yes     Partners: Female    Birth control/protection: None   Tobacco Counseling Counseling given: Not Answered  SDOH Screenings   Food Insecurity: No Food Insecurity (11/04/2024)  Housing: Low Risk (11/04/2024)  Transportation Needs: No Transportation Needs (11/04/2024)  Utilities: Not At Risk (11/04/2024)  Alcohol Screen: Low Risk (11/04/2023)  Depression (PHQ2-9): Low Risk (11/04/2024)  Financial Resource Strain: Low Risk (11/04/2023)  Physical Activity: Insufficiently Active (11/04/2024)  Social Connections: Moderately Integrated (11/04/2024)  Stress: No Stress Concern Present (11/04/2024)  Tobacco Use: Low Risk (11/04/2024)  Health Literacy: Adequate Health Literacy (11/04/2024)   See flowsheets for full screening details  Depression Screen PHQ 2 & 9 Depression Scale- Over the past 2 weeks, how often have you been bothered by any of the following problems? Little interest or pleasure in doing things: 0 Feeling down, depressed, or hopeless (PHQ Adolescent also includes...irritable): 0 PHQ-2 Total Score: 0     Goals Addressed             This Visit's Progress    DIET - EAT MORE FRUITS AND VEGETABLES   On track    Patient Stated   On track    10/11/2020, I will maintain and continue medications as prescribed.              Objective:    Today's Vitals  11/04/24 1513  Weight: 172 lb (78 kg)  Height: 5' 7 (1.702 m)   Body mass index is 26.94 kg/m.  Hearing/Vision screen No results found. Immunizations and Health Maintenance Health Maintenance  Topic Date Due   Influenza Vaccine  05/08/2024   Medicare Annual Wellness (AWV)  11/03/2024   Colonoscopy  01/18/2028   DTaP/Tdap/Td (2 - Td or Tdap) 04/02/2032   Pneumococcal Vaccine: 50+ Years  Completed   Hepatitis C Screening  Completed   Zoster Vaccines- Shingrix  Completed   Meningococcal B Vaccine  Aged Out   COVID-19 Vaccine  Discontinued        Assessment/Plan:  This is a routine wellness examination for  [facility].  Patient Care Team: Avelina Greig BRAVO, MD as PCP - General (Family Medicine) Nahser, Aleene PARAS, MD (Inactive) as PCP - Cardiology (Cardiology) [facility], [place]  I have personally reviewed and noted the following in the patients chart:   Medical and social history Use of alcohol, tobacco or illicit drugs  Current medications and supplements including opioid prescriptions. Functional ability and status Nutritional status Physical activity Advanced directives List of other physicians Hospitalizations, surgeries, and ER visits in previous 12 months Vitals Screenings to include cognitive, depression, and falls Referrals and appointments  No orders of the defined types were placed in this encounter.  In addition, I have reviewed and discussed with patient certain preventive protocols, quality metrics, and best practice recommendations. A written personalized care plan for preventive services as well as general preventive health recommendations were provided to patient.   Erminio LITTIE Saris, LPN   8/71/[id number]    After Visit Summary: (MyChart) Due to this being a telephonic visit, the after visit summary with patients personalized plan was offered to patient via MyChart   Nurse Notes: No voiced or noted concerns at this time Patient advised to keep follow-up appointment with PCP (April 2026) Appointment(s) made: (April 2026 Annual w/ PCP) Vaccines not given: Influenza declined today  "

## 2024-11-04 NOTE — Patient Instructions (Signed)
 Mr. Alex Huffman,  Thank you for taking the time for your Medicare Wellness Visit. I appreciate your continued commitment to your health goals. Please review the care plan we discussed, and feel free to reach out if I can assist you further.  Please note that Annual Wellness Visits do not include a physical exam. Some assessments may be limited, especially if the visit was conducted virtually. If needed, we may recommend an in-person follow-up with your provider.  Ongoing Care Seeing your primary care provider every 3 to 6 months helps us  monitor your health and provide consistent, personalized care.   Referrals If a referral was made during today's visit and you haven't received any updates within two weeks, please contact the referred provider directly to check on the status.  Recommended Screenings:  Health Maintenance  Topic Date Due   Flu Shot  05/08/2024   Medicare Annual Wellness Visit  11/03/2024   Colon Cancer Screening  01/18/2028   DTaP/Tdap/Td vaccine (2 - Td or Tdap) 04/02/2032   Pneumococcal Vaccine for age over 35  Completed   Hepatitis C Screening  Completed   Zoster (Shingles) Vaccine  Completed   Meningitis B Vaccine  Aged Out   COVID-19 Vaccine  Discontinued       11/04/2024    3:18 PM  Advanced Directives  Does Patient Have a Medical Advance Directive? Yes  Type of Estate Agent of [place];Living will  Does patient want to make changes to medical advance directive? No - Patient declined  Copy of Healthcare Power of Attorney in Chart? No - copy requested    Vision: Annual vision screenings are recommended for early detection of glaucoma, cataracts, and diabetic retinopathy. These exams can also reveal signs of chronic conditions such as diabetes and high blood pressure.  Dental: Annual dental screenings help detect early signs of oral cancer, gum disease, and other conditions linked to overall health, including heart disease and  diabetes.  Please see the attached documents for additional preventive care recommendations.

## 2025-01-28 ENCOUNTER — Encounter: Admitting: Family Medicine
